# Patient Record
Sex: Male | Born: 1935
Health system: Southern US, Community
[De-identification: ages and names within clinical notes are randomized; demographics above are authoritative.]

## PROBLEM LIST (undated history)

## (undated) DIAGNOSIS — Z9889 Other specified postprocedural states: Secondary | ICD-10-CM

## (undated) DIAGNOSIS — I519 Heart disease, unspecified: Secondary | ICD-10-CM

## (undated) DIAGNOSIS — C61 Malignant neoplasm of prostate: Secondary | ICD-10-CM

## (undated) DIAGNOSIS — E785 Hyperlipidemia, unspecified: Secondary | ICD-10-CM

## (undated) DIAGNOSIS — K2270 Barrett's esophagus without dysplasia: Secondary | ICD-10-CM

## (undated) DIAGNOSIS — D649 Anemia, unspecified: Secondary | ICD-10-CM

## (undated) DIAGNOSIS — K115 Sialolithiasis: Secondary | ICD-10-CM

## (undated) DIAGNOSIS — I10 Essential (primary) hypertension: Secondary | ICD-10-CM

## (undated) DIAGNOSIS — G610 Guillain-Barre syndrome: Secondary | ICD-10-CM

## (undated) HISTORY — DX: Hyperlipidemia, unspecified: E78.5

## (undated) HISTORY — DX: Barrett's esophagus without dysplasia: K22.70

## (undated) HISTORY — DX: Guillain-Barre syndrome: G61.0

## (undated) HISTORY — DX: Malignant neoplasm of prostate: C61

## (undated) HISTORY — DX: Anemia, unspecified: D64.9

## (undated) HISTORY — DX: Heart disease, unspecified: I51.9

## (undated) HISTORY — PX: APPENDECTOMY: SHX54

## (undated) HISTORY — DX: Essential (primary) hypertension: I10

## (undated) HISTORY — PX: OTHER SURGICAL HISTORY: SHX169

## (undated) HISTORY — PX: REPLACEMENT TOTAL KNEE BILATERAL: SUR1225

---

## 1997-12-19 ENCOUNTER — Encounter: Admission: RE | Admit: 1997-12-19 | Discharge: 1998-03-19 | Payer: Self-pay | Admitting: Internal Medicine

## 1998-07-15 ENCOUNTER — Inpatient Hospital Stay (HOSPITAL_COMMUNITY): Admission: EM | Admit: 1998-07-15 | Discharge: 1998-07-26 | Payer: Self-pay | Admitting: Cardiology

## 1998-07-23 ENCOUNTER — Encounter: Payer: Self-pay | Admitting: Thoracic Surgery (Cardiothoracic Vascular Surgery)

## 1998-11-11 ENCOUNTER — Inpatient Hospital Stay (HOSPITAL_COMMUNITY): Admission: EM | Admit: 1998-11-11 | Discharge: 1998-11-13 | Payer: Self-pay | Admitting: Cardiology

## 2011-05-13 DIAGNOSIS — C61 Malignant neoplasm of prostate: Secondary | ICD-10-CM

## 2011-05-14 ENCOUNTER — Ambulatory Visit
Admission: RE | Admit: 2011-05-14 | Discharge: 2011-05-14 | Disposition: A | Discharge status: Home / Self Care | Payer: Medicare Other | Source: Ambulatory Visit | Attending: Radiation Oncology | Admitting: Radiation Oncology

## 2011-05-14 ENCOUNTER — Encounter: Payer: Self-pay | Admitting: Radiation Oncology

## 2011-05-14 ENCOUNTER — Encounter: Payer: Self-pay | Admitting: *Deleted

## 2011-05-14 VITALS — BP 129/74 | HR 54 | Temp 97.4°F | Resp 18 | Ht 70.0 in | Wt 229.0 lb

## 2011-05-14 DIAGNOSIS — E119 Type 2 diabetes mellitus without complications: Secondary | ICD-10-CM | POA: Insufficient documentation

## 2011-05-14 DIAGNOSIS — Z951 Presence of aortocoronary bypass graft: Secondary | ICD-10-CM | POA: Insufficient documentation

## 2011-05-14 DIAGNOSIS — C61 Malignant neoplasm of prostate: Secondary | ICD-10-CM

## 2011-05-14 DIAGNOSIS — Z87891 Personal history of nicotine dependence: Secondary | ICD-10-CM | POA: Insufficient documentation

## 2011-05-14 DIAGNOSIS — Z96659 Presence of unspecified artificial knee joint: Secondary | ICD-10-CM | POA: Insufficient documentation

## 2011-05-14 DIAGNOSIS — I10 Essential (primary) hypertension: Secondary | ICD-10-CM | POA: Insufficient documentation

## 2011-05-14 DIAGNOSIS — I519 Heart disease, unspecified: Secondary | ICD-10-CM | POA: Insufficient documentation

## 2011-05-14 DIAGNOSIS — E785 Hyperlipidemia, unspecified: Secondary | ICD-10-CM | POA: Insufficient documentation

## 2011-05-14 HISTORY — DX: Sialolithiasis: K11.5

## 2011-05-14 HISTORY — DX: Other specified postprocedural states: Z98.890

## 2011-05-14 NOTE — Progress Notes (Signed)
URINARY INCONTINENCE, INTERMITTENT PAIN IN GROIN AREA, FREQUENCY OF URINATION.  PT STATES HE HAS INGUINAL HERNIA THAT HAS STARTED BOTHERING HIM WITH MOVEMENT.  HAS HX OF BS DROPPING WITH CERTAIN POSITIONS, FEELS FAINT WHEN THIS HAPPENS.

## 2011-05-14 NOTE — Progress Notes (Signed)
[facility] Radiation Oncology NEW PATIENT EVALUATION  Name: Todd Sampson MRN: [id number]  Date: 05/14/2011  DOB: 08-06-1934  Status:outpatient    CC:No primary provider on file.  Debroah Baller, MD    REFERRING PHYSICIAN: Debroah Baller, MD   DIAGNOSIS: Adenocarcinoma of the prostate, clinical stage TI C., favorable risk   HISTORY OF PRESENT ILLNESS::Todd Sampson is a 76 y.o. male who is seen today for the courtesy of Dr. Albin Felling for evaluation of his stage TI C. favorable risk adenocarcinoma prostate. He was noted to have a PSA of 5.3. I do not have any previous PSA determinations. He was seen by Dr. Albin Felling and found to have an elevated PCA 3 of 49. He underwent ultrasound-guided biopsies on 04/29/2011. He was sent to have a Gleason 6 (3+3) adenocarcinoma from the left lateral base, left lateral mid gland and left lateral apex. He had disease involvement of 5%, 1%, and 28%, respectively. His prostate volume was 102.6 cc and prostatitic length 5.97 cm. He does have moderate urinary obstructive symptomatology he was given Flomax samples the past but he did not note any improvement. His I PSS score is 14. No rectal symptomatology although he does have a history of an inguinal hernia. He does have erectile dysfunction and is not sexually active. [name]   PREVIOUS RADIATION THERAPY: No   PAST MEDICAL HISTORY:  has a past medical history of Anemia; Hypertension; Heart disease; Diabetes mellitus; Hyperlipidemia; Barrett's esophagus; Guillain-Barre disease; Prostate cancer; H/O: hemorrhoidectomy; and Salivary gland calculi.     PAST SURGICAL HISTORY: Past Surgical History  Procedure Date  . Appendectomy   . Replacement total knee bilateral   . Cardiac bypass      FAMILY HISTORY: family history includes Cancer in his brother and mother and Heart disease in his brother, father, and sister.   SOCIAL HISTORY:  reports that he has quit smoking. He does not have any smokeless tobacco  history on file. He reports that he does not drink alcohol.   ALLERGIES: Penicillins   MEDICATIONS: Current outpatient prescriptions:amLODipine (NORVASC) 10 MG tablet, Take 10 mg by mouth daily.  , Disp: , Rfl: ;  aspirin EC 325 MG tablet, Take 325 mg by mouth daily.  , Disp: , Rfl: ;  benazepril (LOTENSIN) 40 MG tablet, Take 40 mg by mouth daily.  , Disp: , Rfl: ;  carvedilol (COREG) 3.125 MG tablet, Take 3.125 mg by mouth 2 (two) times daily with a meal.  , Disp: , Rfl:  ferrous sulfate 325 (65 FE) MG tablet, Take 325 mg by mouth daily with breakfast.  , Disp: , Rfl: ;  fish oil-omega-3 fatty acids 1000 MG capsule, Take 1,000 mg by mouth daily.  , Disp: , Rfl: ;  folic acid (FOLVITE) 1 MG tablet, Take 1 mg by mouth daily.  , Disp: , Rfl: ;  isosorbide mononitrate (IMDUR) 30 MG 24 hr tablet, Take 30 mg by mouth daily.  , Disp: , Rfl: ;  niacin 500 MG CR capsule, Take 500 mg by mouth at bedtime.  , Disp: , Rfl:  omeprazole (PRILOSEC) 20 MG capsule, Take 20 mg by mouth daily.  , Disp: , Rfl: ;  Tamsulosin HCl (FLOMAX) 0.4 MG CAPS, Take by mouth. , Disp: , Rfl: ;  thiamine (VITAMIN B-1) 100 MG tablet, Take 100 mg by mouth daily.  , Disp: , Rfl:    REVIEW OF SYSTEMS:  Pertinent items are noted in HPI.    PHYSICAL EXAM:  height is 5\' 10"  (1.778 m) and weight is 229 lb (103.874 kg). His oral temperature is 97.4 F (36.3 C). His blood pressure is 129/74 and his pulse is 54. His respiration is 18.  Head and neck examination grossly unremarkable. Nodes: Without palpable cervical or supraclavicular lymphadenopathy. Chest lungs clear. Heart: Regular rate and rhythm. Back without spinal or CVA discomfort. Abdomen without masses or organomegaly. Genitalia: Unremarkable to inspection. Rectal: The prostate gland is markedly enlarged and is without focal induration or nodularity. Extremities without edema. Neurologic examination: Nonfocal.    LABORATORY DATA: PSA 5.3      IMPRESSION: T1 C. favorable risk  adenocarcinoma prostate. I explained to the patient and his wife that his prognosis is related to his stage, PSA level, and Gleason score. All are favorable. We discussed surgery versus close surveillance versus radiation therapy. We spent a good deal of time discussing close surveillance which would include followup PSA determinations, and perhaps repeating his biopsy in one year. We discussed prostate seed implantation and also external beam/IMRT. His gland size is obviously enlarged, but it may be possible to downsize his gland to allow for prostate seed implantation. He may require up to 6 months of total androgen deprivation. After a lengthy discussion he is most interested in close surveillance which I think is certainly reasonable. He'll maintain close followup with Dr. Albin Felling.  PLAN: As above. He is maintain followup with Dr. Albin Felling. I gave him my voicemail should he want to consider radiation therapy at any point in time.  I spent 60 minutes minutes face to face with the patient and more than 50% of that time was spent in counseling and/or coordination of care.

## 2011-05-14 NOTE — Progress Notes (Signed)
Please see the Nurse Progress Note in the MD Initial Consult Encounter for this patient. 

## 2011-05-21 NOTE — Progress Notes (Signed)
Encounter addended by: Delynn Flavin, RN on: 05/21/2011  6:57 PM<BR>     Documentation filed: Visit Diagnoses

## 2011-05-22 NOTE — Progress Notes (Signed)
Encounter addended by: Amanda Pea, RN on: 05/22/2011 10:02 AM<BR>     Documentation filed: Charges VN

## 2011-07-08 DIAGNOSIS — D500 Iron deficiency anemia secondary to blood loss (chronic): Secondary | ICD-10-CM | POA: Diagnosis not present

## 2011-07-19 DIAGNOSIS — K319 Disease of stomach and duodenum, unspecified: Secondary | ICD-10-CM | POA: Diagnosis not present

## 2011-07-19 DIAGNOSIS — K2940 Chronic atrophic gastritis without bleeding: Secondary | ICD-10-CM | POA: Diagnosis not present

## 2011-07-19 DIAGNOSIS — R1314 Dysphagia, pharyngoesophageal phase: Secondary | ICD-10-CM | POA: Diagnosis not present

## 2011-07-19 DIAGNOSIS — K2270 Barrett's esophagus without dysplasia: Secondary | ICD-10-CM | POA: Diagnosis not present

## 2011-08-05 DIAGNOSIS — D500 Iron deficiency anemia secondary to blood loss (chronic): Secondary | ICD-10-CM | POA: Diagnosis not present

## 2011-08-12 DIAGNOSIS — D485 Neoplasm of uncertain behavior of skin: Secondary | ICD-10-CM | POA: Diagnosis not present

## 2011-08-12 DIAGNOSIS — L570 Actinic keratosis: Secondary | ICD-10-CM | POA: Diagnosis not present

## 2011-08-21 DIAGNOSIS — R351 Nocturia: Secondary | ICD-10-CM | POA: Diagnosis not present

## 2011-08-21 DIAGNOSIS — C61 Malignant neoplasm of prostate: Secondary | ICD-10-CM | POA: Diagnosis not present

## 2011-08-22 DIAGNOSIS — C44611 Basal cell carcinoma of skin of unspecified upper limb, including shoulder: Secondary | ICD-10-CM | POA: Diagnosis not present

## 2011-09-24 DIAGNOSIS — Z79899 Other long term (current) drug therapy: Secondary | ICD-10-CM | POA: Diagnosis not present

## 2011-09-24 DIAGNOSIS — E785 Hyperlipidemia, unspecified: Secondary | ICD-10-CM | POA: Diagnosis not present

## 2011-09-24 DIAGNOSIS — I10 Essential (primary) hypertension: Secondary | ICD-10-CM | POA: Diagnosis not present

## 2011-09-24 DIAGNOSIS — E119 Type 2 diabetes mellitus without complications: Secondary | ICD-10-CM | POA: Diagnosis not present

## 2011-09-24 DIAGNOSIS — I4891 Unspecified atrial fibrillation: Secondary | ICD-10-CM | POA: Diagnosis not present

## 2011-09-24 DIAGNOSIS — I2510 Atherosclerotic heart disease of native coronary artery without angina pectoris: Secondary | ICD-10-CM | POA: Diagnosis not present

## 2011-09-26 DIAGNOSIS — I10 Essential (primary) hypertension: Secondary | ICD-10-CM | POA: Diagnosis not present

## 2011-10-02 ENCOUNTER — Encounter: Payer: Self-pay | Admitting: *Deleted

## 2011-10-16 DIAGNOSIS — E785 Hyperlipidemia, unspecified: Secondary | ICD-10-CM | POA: Diagnosis not present

## 2011-10-16 DIAGNOSIS — Z7901 Long term (current) use of anticoagulants: Secondary | ICD-10-CM | POA: Diagnosis not present

## 2011-10-16 DIAGNOSIS — I2510 Atherosclerotic heart disease of native coronary artery without angina pectoris: Secondary | ICD-10-CM | POA: Diagnosis not present

## 2011-10-16 DIAGNOSIS — I10 Essential (primary) hypertension: Secondary | ICD-10-CM | POA: Diagnosis not present

## 2011-10-16 DIAGNOSIS — E119 Type 2 diabetes mellitus without complications: Secondary | ICD-10-CM | POA: Diagnosis not present

## 2011-10-30 DIAGNOSIS — Z7901 Long term (current) use of anticoagulants: Secondary | ICD-10-CM | POA: Diagnosis not present

## 2011-10-30 DIAGNOSIS — I4891 Unspecified atrial fibrillation: Secondary | ICD-10-CM | POA: Diagnosis not present

## 2011-10-30 DIAGNOSIS — E119 Type 2 diabetes mellitus without complications: Secondary | ICD-10-CM | POA: Diagnosis not present

## 2011-11-06 DIAGNOSIS — I4891 Unspecified atrial fibrillation: Secondary | ICD-10-CM | POA: Diagnosis not present

## 2011-11-06 DIAGNOSIS — Z7901 Long term (current) use of anticoagulants: Secondary | ICD-10-CM | POA: Diagnosis not present

## 2011-11-13 DIAGNOSIS — I4891 Unspecified atrial fibrillation: Secondary | ICD-10-CM | POA: Diagnosis not present

## 2011-11-13 DIAGNOSIS — Z7901 Long term (current) use of anticoagulants: Secondary | ICD-10-CM | POA: Diagnosis not present

## 2011-11-18 DIAGNOSIS — R351 Nocturia: Secondary | ICD-10-CM | POA: Diagnosis not present

## 2011-11-18 DIAGNOSIS — C61 Malignant neoplasm of prostate: Secondary | ICD-10-CM | POA: Diagnosis not present

## 2011-11-19 DIAGNOSIS — D500 Iron deficiency anemia secondary to blood loss (chronic): Secondary | ICD-10-CM | POA: Diagnosis not present

## 2011-11-27 DIAGNOSIS — Z7901 Long term (current) use of anticoagulants: Secondary | ICD-10-CM | POA: Diagnosis not present

## 2011-11-27 DIAGNOSIS — I4891 Unspecified atrial fibrillation: Secondary | ICD-10-CM | POA: Diagnosis not present

## 2011-12-23 DIAGNOSIS — I4891 Unspecified atrial fibrillation: Secondary | ICD-10-CM | POA: Diagnosis not present

## 2011-12-23 DIAGNOSIS — Z7901 Long term (current) use of anticoagulants: Secondary | ICD-10-CM | POA: Diagnosis not present

## 2011-12-27 DIAGNOSIS — D649 Anemia, unspecified: Secondary | ICD-10-CM | POA: Diagnosis not present

## 2011-12-27 DIAGNOSIS — E785 Hyperlipidemia, unspecified: Secondary | ICD-10-CM | POA: Diagnosis not present

## 2011-12-27 DIAGNOSIS — E119 Type 2 diabetes mellitus without complications: Secondary | ICD-10-CM | POA: Diagnosis not present

## 2011-12-27 DIAGNOSIS — I2510 Atherosclerotic heart disease of native coronary artery without angina pectoris: Secondary | ICD-10-CM | POA: Diagnosis not present

## 2011-12-27 DIAGNOSIS — I10 Essential (primary) hypertension: Secondary | ICD-10-CM | POA: Diagnosis not present

## 2012-01-20 DIAGNOSIS — I4891 Unspecified atrial fibrillation: Secondary | ICD-10-CM | POA: Diagnosis not present

## 2012-01-20 DIAGNOSIS — Z7901 Long term (current) use of anticoagulants: Secondary | ICD-10-CM | POA: Diagnosis not present

## 2012-01-21 DIAGNOSIS — T169XXA Foreign body in ear, unspecified ear, initial encounter: Secondary | ICD-10-CM | POA: Diagnosis not present

## 2012-02-03 DIAGNOSIS — I4891 Unspecified atrial fibrillation: Secondary | ICD-10-CM | POA: Diagnosis not present

## 2012-02-03 DIAGNOSIS — Z7901 Long term (current) use of anticoagulants: Secondary | ICD-10-CM | POA: Diagnosis not present

## 2012-02-03 DIAGNOSIS — K2270 Barrett's esophagus without dysplasia: Secondary | ICD-10-CM | POA: Diagnosis not present

## 2012-02-03 DIAGNOSIS — R1314 Dysphagia, pharyngoesophageal phase: Secondary | ICD-10-CM | POA: Diagnosis not present

## 2012-02-10 DIAGNOSIS — I4891 Unspecified atrial fibrillation: Secondary | ICD-10-CM | POA: Diagnosis not present

## 2012-02-10 DIAGNOSIS — Z7901 Long term (current) use of anticoagulants: Secondary | ICD-10-CM | POA: Diagnosis not present

## 2012-02-18 DIAGNOSIS — I4891 Unspecified atrial fibrillation: Secondary | ICD-10-CM | POA: Diagnosis not present

## 2012-02-18 DIAGNOSIS — Z7901 Long term (current) use of anticoagulants: Secondary | ICD-10-CM | POA: Diagnosis not present

## 2012-02-18 DIAGNOSIS — C44320 Squamous cell carcinoma of skin of unspecified parts of face: Secondary | ICD-10-CM | POA: Diagnosis not present

## 2012-02-19 DIAGNOSIS — C61 Malignant neoplasm of prostate: Secondary | ICD-10-CM | POA: Diagnosis not present

## 2012-02-19 DIAGNOSIS — R351 Nocturia: Secondary | ICD-10-CM | POA: Diagnosis not present

## 2012-02-25 DIAGNOSIS — Z7901 Long term (current) use of anticoagulants: Secondary | ICD-10-CM | POA: Diagnosis not present

## 2012-02-25 DIAGNOSIS — I4891 Unspecified atrial fibrillation: Secondary | ICD-10-CM | POA: Diagnosis not present

## 2012-03-17 DIAGNOSIS — Z7901 Long term (current) use of anticoagulants: Secondary | ICD-10-CM | POA: Diagnosis not present

## 2012-03-17 DIAGNOSIS — I4891 Unspecified atrial fibrillation: Secondary | ICD-10-CM | POA: Diagnosis not present

## 2012-03-31 DIAGNOSIS — I10 Essential (primary) hypertension: Secondary | ICD-10-CM | POA: Diagnosis not present

## 2012-04-15 DIAGNOSIS — I4891 Unspecified atrial fibrillation: Secondary | ICD-10-CM | POA: Diagnosis not present

## 2012-04-15 DIAGNOSIS — Z7901 Long term (current) use of anticoagulants: Secondary | ICD-10-CM | POA: Diagnosis not present

## 2012-05-08 DIAGNOSIS — Z23 Encounter for immunization: Secondary | ICD-10-CM | POA: Diagnosis not present

## 2012-05-08 DIAGNOSIS — I10 Essential (primary) hypertension: Secondary | ICD-10-CM | POA: Diagnosis not present

## 2012-05-13 DIAGNOSIS — E785 Hyperlipidemia, unspecified: Secondary | ICD-10-CM | POA: Diagnosis not present

## 2012-05-13 DIAGNOSIS — I10 Essential (primary) hypertension: Secondary | ICD-10-CM | POA: Diagnosis not present

## 2012-05-13 DIAGNOSIS — E669 Obesity, unspecified: Secondary | ICD-10-CM | POA: Diagnosis not present

## 2012-05-13 DIAGNOSIS — E7800 Pure hypercholesterolemia, unspecified: Secondary | ICD-10-CM | POA: Diagnosis not present

## 2012-05-13 DIAGNOSIS — I2510 Atherosclerotic heart disease of native coronary artery without angina pectoris: Secondary | ICD-10-CM | POA: Diagnosis not present

## 2012-05-13 DIAGNOSIS — E119 Type 2 diabetes mellitus without complications: Secondary | ICD-10-CM | POA: Diagnosis not present

## 2012-05-13 DIAGNOSIS — I4891 Unspecified atrial fibrillation: Secondary | ICD-10-CM | POA: Diagnosis not present

## 2012-05-25 DIAGNOSIS — D500 Iron deficiency anemia secondary to blood loss (chronic): Secondary | ICD-10-CM | POA: Diagnosis not present

## 2012-06-04 DIAGNOSIS — H538 Other visual disturbances: Secondary | ICD-10-CM | POA: Diagnosis not present

## 2012-06-10 DIAGNOSIS — Z7901 Long term (current) use of anticoagulants: Secondary | ICD-10-CM | POA: Diagnosis not present

## 2012-06-10 DIAGNOSIS — I4891 Unspecified atrial fibrillation: Secondary | ICD-10-CM | POA: Diagnosis not present

## 2012-06-19 DIAGNOSIS — C61 Malignant neoplasm of prostate: Secondary | ICD-10-CM | POA: Diagnosis not present

## 2012-06-22 DIAGNOSIS — M7989 Other specified soft tissue disorders: Secondary | ICD-10-CM | POA: Diagnosis not present

## 2012-06-22 DIAGNOSIS — R609 Edema, unspecified: Secondary | ICD-10-CM | POA: Diagnosis not present

## 2012-07-09 DIAGNOSIS — I4891 Unspecified atrial fibrillation: Secondary | ICD-10-CM | POA: Diagnosis not present

## 2012-07-09 DIAGNOSIS — Z7901 Long term (current) use of anticoagulants: Secondary | ICD-10-CM | POA: Diagnosis not present

## 2012-07-21 DIAGNOSIS — K2270 Barrett's esophagus without dysplasia: Secondary | ICD-10-CM | POA: Diagnosis not present

## 2012-07-21 DIAGNOSIS — D500 Iron deficiency anemia secondary to blood loss (chronic): Secondary | ICD-10-CM | POA: Diagnosis not present

## 2012-08-05 DIAGNOSIS — Z5181 Encounter for therapeutic drug level monitoring: Secondary | ICD-10-CM | POA: Diagnosis not present

## 2012-08-05 DIAGNOSIS — Z7901 Long term (current) use of anticoagulants: Secondary | ICD-10-CM | POA: Diagnosis not present

## 2012-08-06 DIAGNOSIS — K2270 Barrett's esophagus without dysplasia: Secondary | ICD-10-CM | POA: Diagnosis not present

## 2012-08-12 DIAGNOSIS — I4891 Unspecified atrial fibrillation: Secondary | ICD-10-CM | POA: Diagnosis not present

## 2012-08-12 DIAGNOSIS — Z7901 Long term (current) use of anticoagulants: Secondary | ICD-10-CM | POA: Diagnosis not present

## 2012-08-17 DIAGNOSIS — I4891 Unspecified atrial fibrillation: Secondary | ICD-10-CM | POA: Diagnosis not present

## 2012-08-17 DIAGNOSIS — I10 Essential (primary) hypertension: Secondary | ICD-10-CM | POA: Diagnosis not present

## 2012-08-17 DIAGNOSIS — Z7901 Long term (current) use of anticoagulants: Secondary | ICD-10-CM | POA: Diagnosis not present

## 2012-08-17 DIAGNOSIS — D649 Anemia, unspecified: Secondary | ICD-10-CM | POA: Diagnosis not present

## 2012-08-17 DIAGNOSIS — J018 Other acute sinusitis: Secondary | ICD-10-CM | POA: Diagnosis not present

## 2012-08-24 DIAGNOSIS — I4891 Unspecified atrial fibrillation: Secondary | ICD-10-CM | POA: Diagnosis not present

## 2012-08-24 DIAGNOSIS — Z7901 Long term (current) use of anticoagulants: Secondary | ICD-10-CM | POA: Diagnosis not present

## 2012-08-31 DIAGNOSIS — Z7901 Long term (current) use of anticoagulants: Secondary | ICD-10-CM | POA: Diagnosis not present

## 2012-09-14 DIAGNOSIS — M2550 Pain in unspecified joint: Secondary | ICD-10-CM | POA: Diagnosis not present

## 2012-09-14 DIAGNOSIS — I4891 Unspecified atrial fibrillation: Secondary | ICD-10-CM | POA: Diagnosis not present

## 2012-09-14 DIAGNOSIS — Z7901 Long term (current) use of anticoagulants: Secondary | ICD-10-CM | POA: Diagnosis not present

## 2012-09-15 DIAGNOSIS — R195 Other fecal abnormalities: Secondary | ICD-10-CM | POA: Diagnosis not present

## 2012-10-12 DIAGNOSIS — Z7901 Long term (current) use of anticoagulants: Secondary | ICD-10-CM | POA: Diagnosis not present

## 2012-10-12 DIAGNOSIS — I4891 Unspecified atrial fibrillation: Secondary | ICD-10-CM | POA: Diagnosis not present

## 2012-10-19 DIAGNOSIS — C61 Malignant neoplasm of prostate: Secondary | ICD-10-CM | POA: Diagnosis not present

## 2012-10-19 DIAGNOSIS — Z7901 Long term (current) use of anticoagulants: Secondary | ICD-10-CM | POA: Diagnosis not present

## 2012-10-19 DIAGNOSIS — I4891 Unspecified atrial fibrillation: Secondary | ICD-10-CM | POA: Diagnosis not present

## 2012-10-19 DIAGNOSIS — R351 Nocturia: Secondary | ICD-10-CM | POA: Diagnosis not present

## 2012-10-26 DIAGNOSIS — L600 Ingrowing nail: Secondary | ICD-10-CM | POA: Diagnosis not present

## 2012-10-26 DIAGNOSIS — R894 Abnormal immunological findings in specimens from other organs, systems and tissues: Secondary | ICD-10-CM | POA: Diagnosis not present

## 2012-10-26 DIAGNOSIS — E119 Type 2 diabetes mellitus without complications: Secondary | ICD-10-CM | POA: Diagnosis not present

## 2012-10-27 DIAGNOSIS — I4891 Unspecified atrial fibrillation: Secondary | ICD-10-CM | POA: Diagnosis not present

## 2012-10-27 DIAGNOSIS — Z7901 Long term (current) use of anticoagulants: Secondary | ICD-10-CM | POA: Diagnosis not present

## 2012-11-09 DIAGNOSIS — E119 Type 2 diabetes mellitus without complications: Secondary | ICD-10-CM | POA: Diagnosis not present

## 2012-11-09 DIAGNOSIS — L600 Ingrowing nail: Secondary | ICD-10-CM | POA: Diagnosis not present

## 2012-11-10 DIAGNOSIS — I10 Essential (primary) hypertension: Secondary | ICD-10-CM | POA: Diagnosis not present

## 2012-11-10 DIAGNOSIS — I2510 Atherosclerotic heart disease of native coronary artery without angina pectoris: Secondary | ICD-10-CM | POA: Diagnosis not present

## 2012-11-10 DIAGNOSIS — I4891 Unspecified atrial fibrillation: Secondary | ICD-10-CM | POA: Diagnosis not present

## 2012-11-10 DIAGNOSIS — E669 Obesity, unspecified: Secondary | ICD-10-CM | POA: Diagnosis not present

## 2012-11-10 DIAGNOSIS — E119 Type 2 diabetes mellitus without complications: Secondary | ICD-10-CM | POA: Diagnosis not present

## 2012-11-10 DIAGNOSIS — E7800 Pure hypercholesterolemia, unspecified: Secondary | ICD-10-CM | POA: Diagnosis not present

## 2012-11-16 DIAGNOSIS — I10 Essential (primary) hypertension: Secondary | ICD-10-CM | POA: Diagnosis not present

## 2012-11-16 DIAGNOSIS — D649 Anemia, unspecified: Secondary | ICD-10-CM | POA: Diagnosis not present

## 2012-11-16 DIAGNOSIS — M1990 Unspecified osteoarthritis, unspecified site: Secondary | ICD-10-CM | POA: Diagnosis not present

## 2012-11-26 DIAGNOSIS — I4891 Unspecified atrial fibrillation: Secondary | ICD-10-CM | POA: Diagnosis not present

## 2012-11-26 DIAGNOSIS — E7800 Pure hypercholesterolemia, unspecified: Secondary | ICD-10-CM | POA: Diagnosis not present

## 2012-11-26 DIAGNOSIS — I2510 Atherosclerotic heart disease of native coronary artery without angina pectoris: Secondary | ICD-10-CM | POA: Diagnosis not present

## 2012-11-26 DIAGNOSIS — Z01810 Encounter for preprocedural cardiovascular examination: Secondary | ICD-10-CM | POA: Diagnosis not present

## 2013-01-04 DIAGNOSIS — E119 Type 2 diabetes mellitus without complications: Secondary | ICD-10-CM | POA: Diagnosis not present

## 2013-01-04 DIAGNOSIS — L600 Ingrowing nail: Secondary | ICD-10-CM | POA: Diagnosis not present

## 2013-01-05 DIAGNOSIS — I4891 Unspecified atrial fibrillation: Secondary | ICD-10-CM | POA: Diagnosis not present

## 2013-01-05 DIAGNOSIS — Z7901 Long term (current) use of anticoagulants: Secondary | ICD-10-CM | POA: Diagnosis not present

## 2013-01-06 DIAGNOSIS — E119 Type 2 diabetes mellitus without complications: Secondary | ICD-10-CM | POA: Diagnosis not present

## 2013-01-06 DIAGNOSIS — L600 Ingrowing nail: Secondary | ICD-10-CM | POA: Diagnosis not present

## 2013-01-07 DIAGNOSIS — N4289 Other specified disorders of prostate: Secondary | ICD-10-CM | POA: Diagnosis not present

## 2013-01-07 DIAGNOSIS — R972 Elevated prostate specific antigen [PSA]: Secondary | ICD-10-CM | POA: Diagnosis not present

## 2013-01-07 DIAGNOSIS — C61 Malignant neoplasm of prostate: Secondary | ICD-10-CM | POA: Diagnosis not present

## 2013-01-14 DIAGNOSIS — C61 Malignant neoplasm of prostate: Secondary | ICD-10-CM | POA: Diagnosis not present

## 2013-01-15 DIAGNOSIS — Z7901 Long term (current) use of anticoagulants: Secondary | ICD-10-CM | POA: Diagnosis not present

## 2013-01-15 DIAGNOSIS — I4891 Unspecified atrial fibrillation: Secondary | ICD-10-CM | POA: Diagnosis not present

## 2013-01-20 DIAGNOSIS — E119 Type 2 diabetes mellitus without complications: Secondary | ICD-10-CM | POA: Diagnosis not present

## 2013-01-20 DIAGNOSIS — L600 Ingrowing nail: Secondary | ICD-10-CM | POA: Diagnosis not present

## 2013-01-22 DIAGNOSIS — Z7901 Long term (current) use of anticoagulants: Secondary | ICD-10-CM | POA: Diagnosis not present

## 2013-01-22 DIAGNOSIS — I4891 Unspecified atrial fibrillation: Secondary | ICD-10-CM | POA: Diagnosis not present

## 2013-02-01 DIAGNOSIS — I2510 Atherosclerotic heart disease of native coronary artery without angina pectoris: Secondary | ICD-10-CM | POA: Diagnosis not present

## 2013-02-01 DIAGNOSIS — I10 Essential (primary) hypertension: Secondary | ICD-10-CM | POA: Diagnosis not present

## 2013-02-01 DIAGNOSIS — E785 Hyperlipidemia, unspecified: Secondary | ICD-10-CM | POA: Diagnosis not present

## 2013-02-01 DIAGNOSIS — E119 Type 2 diabetes mellitus without complications: Secondary | ICD-10-CM | POA: Diagnosis not present

## 2013-02-01 DIAGNOSIS — I4891 Unspecified atrial fibrillation: Secondary | ICD-10-CM | POA: Diagnosis not present

## 2013-02-05 DIAGNOSIS — Z7901 Long term (current) use of anticoagulants: Secondary | ICD-10-CM | POA: Diagnosis not present

## 2013-02-05 DIAGNOSIS — I4891 Unspecified atrial fibrillation: Secondary | ICD-10-CM | POA: Diagnosis not present

## 2013-02-19 DIAGNOSIS — I4891 Unspecified atrial fibrillation: Secondary | ICD-10-CM | POA: Diagnosis not present

## 2013-02-19 DIAGNOSIS — Z7901 Long term (current) use of anticoagulants: Secondary | ICD-10-CM | POA: Diagnosis not present

## 2013-03-19 DIAGNOSIS — I4891 Unspecified atrial fibrillation: Secondary | ICD-10-CM | POA: Diagnosis not present

## 2013-03-19 DIAGNOSIS — Z7901 Long term (current) use of anticoagulants: Secondary | ICD-10-CM | POA: Diagnosis not present

## 2013-04-16 DIAGNOSIS — I4891 Unspecified atrial fibrillation: Secondary | ICD-10-CM | POA: Diagnosis not present

## 2013-04-16 DIAGNOSIS — Z7901 Long term (current) use of anticoagulants: Secondary | ICD-10-CM | POA: Diagnosis not present

## 2013-04-20 DIAGNOSIS — M545 Low back pain: Secondary | ICD-10-CM | POA: Diagnosis not present

## 2013-04-20 DIAGNOSIS — M25559 Pain in unspecified hip: Secondary | ICD-10-CM | POA: Diagnosis not present

## 2013-04-21 DIAGNOSIS — M545 Low back pain, unspecified: Secondary | ICD-10-CM | POA: Diagnosis not present

## 2013-04-21 DIAGNOSIS — M25559 Pain in unspecified hip: Secondary | ICD-10-CM | POA: Diagnosis not present

## 2013-04-21 DIAGNOSIS — M47817 Spondylosis without myelopathy or radiculopathy, lumbosacral region: Secondary | ICD-10-CM | POA: Diagnosis not present

## 2013-04-22 DIAGNOSIS — Z7901 Long term (current) use of anticoagulants: Secondary | ICD-10-CM | POA: Diagnosis not present

## 2013-04-22 DIAGNOSIS — I4891 Unspecified atrial fibrillation: Secondary | ICD-10-CM | POA: Diagnosis not present

## 2013-04-26 DIAGNOSIS — M48061 Spinal stenosis, lumbar region without neurogenic claudication: Secondary | ICD-10-CM | POA: Diagnosis not present

## 2013-04-26 DIAGNOSIS — M545 Low back pain: Secondary | ICD-10-CM | POA: Diagnosis not present

## 2013-05-05 DIAGNOSIS — Z23 Encounter for immunization: Secondary | ICD-10-CM | POA: Diagnosis not present

## 2013-05-05 DIAGNOSIS — I10 Essential (primary) hypertension: Secondary | ICD-10-CM | POA: Diagnosis not present

## 2013-05-05 DIAGNOSIS — D649 Anemia, unspecified: Secondary | ICD-10-CM | POA: Diagnosis not present

## 2013-05-06 DIAGNOSIS — I4891 Unspecified atrial fibrillation: Secondary | ICD-10-CM | POA: Diagnosis not present

## 2013-05-06 DIAGNOSIS — Z7901 Long term (current) use of anticoagulants: Secondary | ICD-10-CM | POA: Diagnosis not present

## 2013-05-17 DIAGNOSIS — C61 Malignant neoplasm of prostate: Secondary | ICD-10-CM | POA: Diagnosis not present

## 2013-05-17 DIAGNOSIS — R351 Nocturia: Secondary | ICD-10-CM | POA: Diagnosis not present

## 2013-05-26 DIAGNOSIS — E7800 Pure hypercholesterolemia, unspecified: Secondary | ICD-10-CM | POA: Diagnosis not present

## 2013-05-26 DIAGNOSIS — I4891 Unspecified atrial fibrillation: Secondary | ICD-10-CM | POA: Diagnosis not present

## 2013-05-26 DIAGNOSIS — I10 Essential (primary) hypertension: Secondary | ICD-10-CM | POA: Diagnosis not present

## 2013-05-26 DIAGNOSIS — E669 Obesity, unspecified: Secondary | ICD-10-CM | POA: Diagnosis not present

## 2013-05-26 DIAGNOSIS — E119 Type 2 diabetes mellitus without complications: Secondary | ICD-10-CM | POA: Diagnosis not present

## 2013-06-03 DIAGNOSIS — I4891 Unspecified atrial fibrillation: Secondary | ICD-10-CM | POA: Diagnosis not present

## 2013-06-03 DIAGNOSIS — Z7901 Long term (current) use of anticoagulants: Secondary | ICD-10-CM | POA: Diagnosis not present

## 2013-06-07 DIAGNOSIS — E119 Type 2 diabetes mellitus without complications: Secondary | ICD-10-CM | POA: Diagnosis not present

## 2013-06-07 DIAGNOSIS — H524 Presbyopia: Secondary | ICD-10-CM | POA: Diagnosis not present

## 2013-06-17 DIAGNOSIS — I4891 Unspecified atrial fibrillation: Secondary | ICD-10-CM | POA: Diagnosis not present

## 2013-07-02 DIAGNOSIS — I4891 Unspecified atrial fibrillation: Secondary | ICD-10-CM | POA: Diagnosis not present

## 2013-07-02 DIAGNOSIS — Z7901 Long term (current) use of anticoagulants: Secondary | ICD-10-CM | POA: Diagnosis not present

## 2013-07-07 DIAGNOSIS — L98499 Non-pressure chronic ulcer of skin of other sites with unspecified severity: Secondary | ICD-10-CM | POA: Diagnosis not present

## 2013-07-07 DIAGNOSIS — I872 Venous insufficiency (chronic) (peripheral): Secondary | ICD-10-CM | POA: Diagnosis not present

## 2013-07-07 DIAGNOSIS — E119 Type 2 diabetes mellitus without complications: Secondary | ICD-10-CM | POA: Diagnosis not present

## 2013-07-12 DIAGNOSIS — L98499 Non-pressure chronic ulcer of skin of other sites with unspecified severity: Secondary | ICD-10-CM | POA: Diagnosis not present

## 2013-07-12 DIAGNOSIS — I872 Venous insufficiency (chronic) (peripheral): Secondary | ICD-10-CM | POA: Diagnosis not present

## 2013-07-15 DIAGNOSIS — L98499 Non-pressure chronic ulcer of skin of other sites with unspecified severity: Secondary | ICD-10-CM | POA: Diagnosis not present

## 2013-07-15 DIAGNOSIS — E119 Type 2 diabetes mellitus without complications: Secondary | ICD-10-CM | POA: Diagnosis not present

## 2013-07-15 DIAGNOSIS — I872 Venous insufficiency (chronic) (peripheral): Secondary | ICD-10-CM | POA: Diagnosis not present

## 2013-07-19 DIAGNOSIS — L98499 Non-pressure chronic ulcer of skin of other sites with unspecified severity: Secondary | ICD-10-CM | POA: Diagnosis not present

## 2013-07-19 DIAGNOSIS — E119 Type 2 diabetes mellitus without complications: Secondary | ICD-10-CM | POA: Diagnosis not present

## 2013-07-19 DIAGNOSIS — I872 Venous insufficiency (chronic) (peripheral): Secondary | ICD-10-CM | POA: Diagnosis not present

## 2013-07-21 DIAGNOSIS — E119 Type 2 diabetes mellitus without complications: Secondary | ICD-10-CM | POA: Diagnosis not present

## 2013-07-21 DIAGNOSIS — I872 Venous insufficiency (chronic) (peripheral): Secondary | ICD-10-CM | POA: Diagnosis not present

## 2013-07-21 DIAGNOSIS — L98499 Non-pressure chronic ulcer of skin of other sites with unspecified severity: Secondary | ICD-10-CM | POA: Diagnosis not present

## 2013-07-26 DIAGNOSIS — I872 Venous insufficiency (chronic) (peripheral): Secondary | ICD-10-CM | POA: Diagnosis not present

## 2013-07-26 DIAGNOSIS — E119 Type 2 diabetes mellitus without complications: Secondary | ICD-10-CM | POA: Diagnosis not present

## 2013-07-26 DIAGNOSIS — L98499 Non-pressure chronic ulcer of skin of other sites with unspecified severity: Secondary | ICD-10-CM | POA: Diagnosis not present

## 2013-07-28 DIAGNOSIS — I872 Venous insufficiency (chronic) (peripheral): Secondary | ICD-10-CM | POA: Diagnosis not present

## 2013-07-28 DIAGNOSIS — I83009 Varicose veins of unspecified lower extremity with ulcer of unspecified site: Secondary | ICD-10-CM | POA: Diagnosis not present

## 2013-07-28 DIAGNOSIS — L97909 Non-pressure chronic ulcer of unspecified part of unspecified lower leg with unspecified severity: Secondary | ICD-10-CM | POA: Diagnosis not present

## 2013-07-28 DIAGNOSIS — L98499 Non-pressure chronic ulcer of skin of other sites with unspecified severity: Secondary | ICD-10-CM | POA: Diagnosis not present

## 2013-07-30 DIAGNOSIS — I4891 Unspecified atrial fibrillation: Secondary | ICD-10-CM | POA: Diagnosis not present

## 2013-07-30 DIAGNOSIS — Z7901 Long term (current) use of anticoagulants: Secondary | ICD-10-CM | POA: Diagnosis not present

## 2013-08-02 DIAGNOSIS — L98499 Non-pressure chronic ulcer of skin of other sites with unspecified severity: Secondary | ICD-10-CM | POA: Diagnosis not present

## 2013-08-02 DIAGNOSIS — I83009 Varicose veins of unspecified lower extremity with ulcer of unspecified site: Secondary | ICD-10-CM | POA: Diagnosis not present

## 2013-08-02 DIAGNOSIS — I872 Venous insufficiency (chronic) (peripheral): Secondary | ICD-10-CM | POA: Diagnosis not present

## 2013-08-09 DIAGNOSIS — I4891 Unspecified atrial fibrillation: Secondary | ICD-10-CM | POA: Diagnosis not present

## 2013-08-09 DIAGNOSIS — L97909 Non-pressure chronic ulcer of unspecified part of unspecified lower leg with unspecified severity: Secondary | ICD-10-CM | POA: Diagnosis not present

## 2013-08-09 DIAGNOSIS — I872 Venous insufficiency (chronic) (peripheral): Secondary | ICD-10-CM | POA: Diagnosis not present

## 2013-08-09 DIAGNOSIS — L98499 Non-pressure chronic ulcer of skin of other sites with unspecified severity: Secondary | ICD-10-CM | POA: Diagnosis not present

## 2013-08-09 DIAGNOSIS — I83009 Varicose veins of unspecified lower extremity with ulcer of unspecified site: Secondary | ICD-10-CM | POA: Diagnosis not present

## 2013-08-16 DIAGNOSIS — I872 Venous insufficiency (chronic) (peripheral): Secondary | ICD-10-CM | POA: Diagnosis not present

## 2013-08-16 DIAGNOSIS — L98499 Non-pressure chronic ulcer of skin of other sites with unspecified severity: Secondary | ICD-10-CM | POA: Diagnosis not present

## 2013-08-27 DIAGNOSIS — I4891 Unspecified atrial fibrillation: Secondary | ICD-10-CM | POA: Diagnosis not present

## 2013-08-27 DIAGNOSIS — Z7901 Long term (current) use of anticoagulants: Secondary | ICD-10-CM | POA: Diagnosis not present

## 2013-09-15 DIAGNOSIS — C61 Malignant neoplasm of prostate: Secondary | ICD-10-CM | POA: Diagnosis not present

## 2013-09-15 DIAGNOSIS — R351 Nocturia: Secondary | ICD-10-CM | POA: Diagnosis not present

## 2013-09-20 DIAGNOSIS — I83009 Varicose veins of unspecified lower extremity with ulcer of unspecified site: Secondary | ICD-10-CM | POA: Diagnosis not present

## 2013-09-20 DIAGNOSIS — I872 Venous insufficiency (chronic) (peripheral): Secondary | ICD-10-CM | POA: Diagnosis not present

## 2013-09-20 DIAGNOSIS — L97909 Non-pressure chronic ulcer of unspecified part of unspecified lower leg with unspecified severity: Secondary | ICD-10-CM | POA: Diagnosis not present

## 2013-09-23 DIAGNOSIS — L259 Unspecified contact dermatitis, unspecified cause: Secondary | ICD-10-CM | POA: Diagnosis not present

## 2013-09-23 DIAGNOSIS — L981 Factitial dermatitis: Secondary | ICD-10-CM | POA: Diagnosis not present

## 2013-09-24 DIAGNOSIS — Z7901 Long term (current) use of anticoagulants: Secondary | ICD-10-CM | POA: Diagnosis not present

## 2013-09-24 DIAGNOSIS — I4891 Unspecified atrial fibrillation: Secondary | ICD-10-CM | POA: Diagnosis not present

## 2013-09-27 DIAGNOSIS — I872 Venous insufficiency (chronic) (peripheral): Secondary | ICD-10-CM | POA: Diagnosis not present

## 2013-09-27 DIAGNOSIS — L97909 Non-pressure chronic ulcer of unspecified part of unspecified lower leg with unspecified severity: Secondary | ICD-10-CM | POA: Diagnosis not present

## 2013-09-27 DIAGNOSIS — I83009 Varicose veins of unspecified lower extremity with ulcer of unspecified site: Secondary | ICD-10-CM | POA: Diagnosis not present

## 2013-09-27 DIAGNOSIS — L98499 Non-pressure chronic ulcer of skin of other sites with unspecified severity: Secondary | ICD-10-CM | POA: Diagnosis not present

## 2013-10-13 DIAGNOSIS — I872 Venous insufficiency (chronic) (peripheral): Secondary | ICD-10-CM | POA: Diagnosis not present

## 2013-10-13 DIAGNOSIS — I83009 Varicose veins of unspecified lower extremity with ulcer of unspecified site: Secondary | ICD-10-CM | POA: Diagnosis not present

## 2013-10-20 DIAGNOSIS — I872 Venous insufficiency (chronic) (peripheral): Secondary | ICD-10-CM | POA: Diagnosis not present

## 2013-10-20 DIAGNOSIS — L97909 Non-pressure chronic ulcer of unspecified part of unspecified lower leg with unspecified severity: Secondary | ICD-10-CM | POA: Diagnosis not present

## 2013-10-20 DIAGNOSIS — I83009 Varicose veins of unspecified lower extremity with ulcer of unspecified site: Secondary | ICD-10-CM | POA: Diagnosis not present

## 2013-10-26 DIAGNOSIS — I4891 Unspecified atrial fibrillation: Secondary | ICD-10-CM | POA: Diagnosis not present

## 2013-10-26 DIAGNOSIS — Z7901 Long term (current) use of anticoagulants: Secondary | ICD-10-CM | POA: Diagnosis not present

## 2013-10-27 DIAGNOSIS — I872 Venous insufficiency (chronic) (peripheral): Secondary | ICD-10-CM | POA: Diagnosis not present

## 2013-10-27 DIAGNOSIS — I83009 Varicose veins of unspecified lower extremity with ulcer of unspecified site: Secondary | ICD-10-CM | POA: Diagnosis not present

## 2013-11-11 DIAGNOSIS — E1149 Type 2 diabetes mellitus with other diabetic neurological complication: Secondary | ICD-10-CM | POA: Diagnosis not present

## 2013-11-11 DIAGNOSIS — E119 Type 2 diabetes mellitus without complications: Secondary | ICD-10-CM | POA: Diagnosis not present

## 2013-11-23 DIAGNOSIS — I4891 Unspecified atrial fibrillation: Secondary | ICD-10-CM | POA: Diagnosis not present

## 2013-11-23 DIAGNOSIS — Z7901 Long term (current) use of anticoagulants: Secondary | ICD-10-CM | POA: Diagnosis not present

## 2013-12-07 DIAGNOSIS — Z951 Presence of aortocoronary bypass graft: Secondary | ICD-10-CM | POA: Diagnosis not present

## 2013-12-07 DIAGNOSIS — I2510 Atherosclerotic heart disease of native coronary artery without angina pectoris: Secondary | ICD-10-CM | POA: Diagnosis not present

## 2013-12-07 DIAGNOSIS — I10 Essential (primary) hypertension: Secondary | ICD-10-CM | POA: Diagnosis not present

## 2013-12-07 DIAGNOSIS — E669 Obesity, unspecified: Secondary | ICD-10-CM | POA: Diagnosis not present

## 2013-12-07 DIAGNOSIS — E119 Type 2 diabetes mellitus without complications: Secondary | ICD-10-CM | POA: Diagnosis not present

## 2013-12-07 DIAGNOSIS — E7800 Pure hypercholesterolemia, unspecified: Secondary | ICD-10-CM | POA: Diagnosis not present

## 2013-12-21 DIAGNOSIS — I4891 Unspecified atrial fibrillation: Secondary | ICD-10-CM | POA: Diagnosis not present

## 2013-12-21 DIAGNOSIS — Z7901 Long term (current) use of anticoagulants: Secondary | ICD-10-CM | POA: Diagnosis not present

## 2014-01-04 DIAGNOSIS — I4891 Unspecified atrial fibrillation: Secondary | ICD-10-CM | POA: Diagnosis not present

## 2014-01-04 DIAGNOSIS — Z7901 Long term (current) use of anticoagulants: Secondary | ICD-10-CM | POA: Diagnosis not present

## 2014-01-17 DIAGNOSIS — C61 Malignant neoplasm of prostate: Secondary | ICD-10-CM | POA: Diagnosis not present

## 2014-01-17 DIAGNOSIS — R351 Nocturia: Secondary | ICD-10-CM | POA: Diagnosis not present

## 2014-01-25 DIAGNOSIS — I4891 Unspecified atrial fibrillation: Secondary | ICD-10-CM | POA: Diagnosis not present

## 2014-01-25 DIAGNOSIS — Z7901 Long term (current) use of anticoagulants: Secondary | ICD-10-CM | POA: Diagnosis not present

## 2014-02-17 DIAGNOSIS — E785 Hyperlipidemia, unspecified: Secondary | ICD-10-CM | POA: Diagnosis not present

## 2014-02-17 DIAGNOSIS — I10 Essential (primary) hypertension: Secondary | ICD-10-CM | POA: Diagnosis not present

## 2014-02-17 DIAGNOSIS — E119 Type 2 diabetes mellitus without complications: Secondary | ICD-10-CM | POA: Diagnosis not present

## 2014-02-17 DIAGNOSIS — I2510 Atherosclerotic heart disease of native coronary artery without angina pectoris: Secondary | ICD-10-CM | POA: Diagnosis not present

## 2014-02-22 DIAGNOSIS — Z7901 Long term (current) use of anticoagulants: Secondary | ICD-10-CM | POA: Diagnosis not present

## 2014-02-22 DIAGNOSIS — I4891 Unspecified atrial fibrillation: Secondary | ICD-10-CM | POA: Diagnosis not present

## 2014-03-08 DIAGNOSIS — E119 Type 2 diabetes mellitus without complications: Secondary | ICD-10-CM | POA: Diagnosis not present

## 2014-03-22 DIAGNOSIS — I4891 Unspecified atrial fibrillation: Secondary | ICD-10-CM | POA: Diagnosis not present

## 2014-03-22 DIAGNOSIS — Z7901 Long term (current) use of anticoagulants: Secondary | ICD-10-CM | POA: Diagnosis not present

## 2014-04-21 DIAGNOSIS — I4891 Unspecified atrial fibrillation: Secondary | ICD-10-CM | POA: Diagnosis not present

## 2014-04-21 DIAGNOSIS — Z7901 Long term (current) use of anticoagulants: Secondary | ICD-10-CM | POA: Diagnosis not present

## 2014-05-03 DIAGNOSIS — C44311 Basal cell carcinoma of skin of nose: Secondary | ICD-10-CM | POA: Diagnosis not present

## 2014-05-03 DIAGNOSIS — D485 Neoplasm of uncertain behavior of skin: Secondary | ICD-10-CM | POA: Diagnosis not present

## 2014-05-03 DIAGNOSIS — L570 Actinic keratosis: Secondary | ICD-10-CM | POA: Diagnosis not present

## 2014-05-04 DIAGNOSIS — I83893 Varicose veins of bilateral lower extremities with other complications: Secondary | ICD-10-CM | POA: Diagnosis not present

## 2014-05-04 DIAGNOSIS — I872 Venous insufficiency (chronic) (peripheral): Secondary | ICD-10-CM | POA: Diagnosis not present

## 2014-05-04 DIAGNOSIS — I82819 Embolism and thrombosis of superficial veins of unspecified lower extremities: Secondary | ICD-10-CM | POA: Diagnosis not present

## 2014-05-12 DIAGNOSIS — I10 Essential (primary) hypertension: Secondary | ICD-10-CM | POA: Diagnosis not present

## 2014-05-12 DIAGNOSIS — E785 Hyperlipidemia, unspecified: Secondary | ICD-10-CM | POA: Diagnosis not present

## 2014-05-12 DIAGNOSIS — I4891 Unspecified atrial fibrillation: Secondary | ICD-10-CM | POA: Diagnosis not present

## 2014-05-12 DIAGNOSIS — I2510 Atherosclerotic heart disease of native coronary artery without angina pectoris: Secondary | ICD-10-CM | POA: Diagnosis not present

## 2014-05-12 DIAGNOSIS — Z79899 Other long term (current) drug therapy: Secondary | ICD-10-CM | POA: Diagnosis not present

## 2014-05-12 DIAGNOSIS — E669 Obesity, unspecified: Secondary | ICD-10-CM | POA: Diagnosis not present

## 2014-05-12 DIAGNOSIS — E119 Type 2 diabetes mellitus without complications: Secondary | ICD-10-CM | POA: Diagnosis not present

## 2014-05-12 DIAGNOSIS — Z951 Presence of aortocoronary bypass graft: Secondary | ICD-10-CM | POA: Diagnosis not present

## 2014-05-19 DIAGNOSIS — I4891 Unspecified atrial fibrillation: Secondary | ICD-10-CM | POA: Diagnosis not present

## 2014-05-19 DIAGNOSIS — Z7901 Long term (current) use of anticoagulants: Secondary | ICD-10-CM | POA: Diagnosis not present

## 2014-05-19 DIAGNOSIS — I2510 Atherosclerotic heart disease of native coronary artery without angina pectoris: Secondary | ICD-10-CM | POA: Diagnosis not present

## 2014-05-25 DIAGNOSIS — C44311 Basal cell carcinoma of skin of nose: Secondary | ICD-10-CM | POA: Diagnosis not present

## 2014-05-30 DIAGNOSIS — E119 Type 2 diabetes mellitus without complications: Secondary | ICD-10-CM | POA: Diagnosis not present

## 2014-05-30 DIAGNOSIS — I10 Essential (primary) hypertension: Secondary | ICD-10-CM | POA: Diagnosis not present

## 2014-05-30 DIAGNOSIS — R062 Wheezing: Secondary | ICD-10-CM | POA: Diagnosis not present

## 2014-05-31 DIAGNOSIS — R0989 Other specified symptoms and signs involving the circulatory and respiratory systems: Secondary | ICD-10-CM | POA: Diagnosis not present

## 2014-05-31 DIAGNOSIS — R062 Wheezing: Secondary | ICD-10-CM | POA: Diagnosis not present

## 2014-05-31 DIAGNOSIS — Z951 Presence of aortocoronary bypass graft: Secondary | ICD-10-CM | POA: Diagnosis not present

## 2014-06-01 DIAGNOSIS — J4530 Mild persistent asthma, uncomplicated: Secondary | ICD-10-CM | POA: Diagnosis not present

## 2014-06-02 DIAGNOSIS — Z7901 Long term (current) use of anticoagulants: Secondary | ICD-10-CM | POA: Diagnosis not present

## 2014-06-14 DIAGNOSIS — J4530 Mild persistent asthma, uncomplicated: Secondary | ICD-10-CM | POA: Diagnosis not present

## 2014-06-16 DIAGNOSIS — Z7901 Long term (current) use of anticoagulants: Secondary | ICD-10-CM | POA: Diagnosis not present

## 2014-06-21 DIAGNOSIS — Z23 Encounter for immunization: Secondary | ICD-10-CM | POA: Diagnosis not present

## 2014-07-15 DIAGNOSIS — Z7901 Long term (current) use of anticoagulants: Secondary | ICD-10-CM | POA: Diagnosis not present

## 2014-07-27 DIAGNOSIS — R351 Nocturia: Secondary | ICD-10-CM | POA: Diagnosis not present

## 2014-07-27 DIAGNOSIS — R809 Proteinuria, unspecified: Secondary | ICD-10-CM | POA: Diagnosis not present

## 2014-07-27 DIAGNOSIS — C61 Malignant neoplasm of prostate: Secondary | ICD-10-CM | POA: Diagnosis not present

## 2014-08-04 DIAGNOSIS — K2270 Barrett's esophagus without dysplasia: Secondary | ICD-10-CM | POA: Diagnosis not present

## 2014-08-04 DIAGNOSIS — Z7901 Long term (current) use of anticoagulants: Secondary | ICD-10-CM | POA: Diagnosis not present

## 2014-08-16 DIAGNOSIS — Z7901 Long term (current) use of anticoagulants: Secondary | ICD-10-CM | POA: Diagnosis not present

## 2014-08-16 DIAGNOSIS — I4891 Unspecified atrial fibrillation: Secondary | ICD-10-CM | POA: Diagnosis not present

## 2014-08-25 DIAGNOSIS — I480 Paroxysmal atrial fibrillation: Secondary | ICD-10-CM | POA: Diagnosis not present

## 2014-08-25 DIAGNOSIS — Z7901 Long term (current) use of anticoagulants: Secondary | ICD-10-CM | POA: Diagnosis not present

## 2014-08-26 DIAGNOSIS — K2270 Barrett's esophagus without dysplasia: Secondary | ICD-10-CM | POA: Diagnosis not present

## 2014-09-02 DIAGNOSIS — Z7901 Long term (current) use of anticoagulants: Secondary | ICD-10-CM | POA: Diagnosis not present

## 2014-09-02 DIAGNOSIS — J4530 Mild persistent asthma, uncomplicated: Secondary | ICD-10-CM | POA: Diagnosis not present

## 2014-09-08 DIAGNOSIS — I10 Essential (primary) hypertension: Secondary | ICD-10-CM | POA: Diagnosis not present

## 2014-09-08 DIAGNOSIS — E119 Type 2 diabetes mellitus without complications: Secondary | ICD-10-CM | POA: Diagnosis not present

## 2014-09-08 DIAGNOSIS — J4520 Mild intermittent asthma, uncomplicated: Secondary | ICD-10-CM | POA: Diagnosis not present

## 2014-09-09 DIAGNOSIS — I4891 Unspecified atrial fibrillation: Secondary | ICD-10-CM | POA: Diagnosis not present

## 2014-09-09 DIAGNOSIS — Z7901 Long term (current) use of anticoagulants: Secondary | ICD-10-CM | POA: Diagnosis not present

## 2014-09-22 DIAGNOSIS — I480 Paroxysmal atrial fibrillation: Secondary | ICD-10-CM | POA: Diagnosis not present

## 2014-09-22 DIAGNOSIS — Z7901 Long term (current) use of anticoagulants: Secondary | ICD-10-CM | POA: Diagnosis not present

## 2014-10-04 DIAGNOSIS — C44319 Basal cell carcinoma of skin of other parts of face: Secondary | ICD-10-CM | POA: Diagnosis not present

## 2014-10-04 DIAGNOSIS — L814 Other melanin hyperpigmentation: Secondary | ICD-10-CM | POA: Diagnosis not present

## 2014-10-04 DIAGNOSIS — L570 Actinic keratosis: Secondary | ICD-10-CM | POA: Diagnosis not present

## 2014-10-20 DIAGNOSIS — I4891 Unspecified atrial fibrillation: Secondary | ICD-10-CM | POA: Diagnosis not present

## 2014-10-20 DIAGNOSIS — Z7901 Long term (current) use of anticoagulants: Secondary | ICD-10-CM | POA: Diagnosis not present

## 2014-11-17 DIAGNOSIS — Z7901 Long term (current) use of anticoagulants: Secondary | ICD-10-CM | POA: Diagnosis not present

## 2014-11-17 DIAGNOSIS — I4891 Unspecified atrial fibrillation: Secondary | ICD-10-CM | POA: Diagnosis not present

## 2014-11-22 DIAGNOSIS — J4530 Mild persistent asthma, uncomplicated: Secondary | ICD-10-CM | POA: Diagnosis not present

## 2014-12-01 DIAGNOSIS — Z7901 Long term (current) use of anticoagulants: Secondary | ICD-10-CM | POA: Diagnosis not present

## 2014-12-01 DIAGNOSIS — I482 Chronic atrial fibrillation: Secondary | ICD-10-CM | POA: Diagnosis not present

## 2014-12-09 DIAGNOSIS — I10 Essential (primary) hypertension: Secondary | ICD-10-CM | POA: Diagnosis not present

## 2014-12-09 DIAGNOSIS — E785 Hyperlipidemia, unspecified: Secondary | ICD-10-CM | POA: Diagnosis not present

## 2014-12-09 DIAGNOSIS — E119 Type 2 diabetes mellitus without complications: Secondary | ICD-10-CM | POA: Diagnosis not present

## 2015-01-03 DIAGNOSIS — I482 Chronic atrial fibrillation: Secondary | ICD-10-CM | POA: Diagnosis not present

## 2015-01-03 DIAGNOSIS — Z7901 Long term (current) use of anticoagulants: Secondary | ICD-10-CM | POA: Diagnosis not present

## 2015-01-25 DIAGNOSIS — C61 Malignant neoplasm of prostate: Secondary | ICD-10-CM | POA: Diagnosis not present

## 2015-01-25 DIAGNOSIS — R351 Nocturia: Secondary | ICD-10-CM | POA: Diagnosis not present

## 2015-01-25 DIAGNOSIS — R809 Proteinuria, unspecified: Secondary | ICD-10-CM | POA: Diagnosis not present

## 2015-01-31 DIAGNOSIS — I482 Chronic atrial fibrillation: Secondary | ICD-10-CM | POA: Diagnosis not present

## 2015-01-31 DIAGNOSIS — Z7901 Long term (current) use of anticoagulants: Secondary | ICD-10-CM | POA: Diagnosis not present

## 2015-02-24 DIAGNOSIS — J4530 Mild persistent asthma, uncomplicated: Secondary | ICD-10-CM | POA: Diagnosis not present

## 2015-02-28 DIAGNOSIS — Z7901 Long term (current) use of anticoagulants: Secondary | ICD-10-CM | POA: Diagnosis not present

## 2015-02-28 DIAGNOSIS — I482 Chronic atrial fibrillation: Secondary | ICD-10-CM | POA: Diagnosis not present

## 2015-03-13 DIAGNOSIS — E119 Type 2 diabetes mellitus without complications: Secondary | ICD-10-CM | POA: Diagnosis not present

## 2015-03-13 DIAGNOSIS — H2513 Age-related nuclear cataract, bilateral: Secondary | ICD-10-CM | POA: Diagnosis not present

## 2015-03-15 DIAGNOSIS — I2510 Atherosclerotic heart disease of native coronary artery without angina pectoris: Secondary | ICD-10-CM | POA: Diagnosis not present

## 2015-03-15 DIAGNOSIS — E119 Type 2 diabetes mellitus without complications: Secondary | ICD-10-CM | POA: Diagnosis not present

## 2015-03-15 DIAGNOSIS — I10 Essential (primary) hypertension: Secondary | ICD-10-CM | POA: Diagnosis not present

## 2015-03-15 DIAGNOSIS — G629 Polyneuropathy, unspecified: Secondary | ICD-10-CM | POA: Diagnosis not present

## 2015-03-15 DIAGNOSIS — C61 Malignant neoplasm of prostate: Secondary | ICD-10-CM | POA: Diagnosis not present

## 2015-03-15 DIAGNOSIS — J4530 Mild persistent asthma, uncomplicated: Secondary | ICD-10-CM | POA: Diagnosis not present

## 2015-03-15 DIAGNOSIS — M129 Arthropathy, unspecified: Secondary | ICD-10-CM | POA: Diagnosis not present

## 2015-03-15 DIAGNOSIS — G603 Idiopathic progressive neuropathy: Secondary | ICD-10-CM | POA: Diagnosis not present

## 2015-03-15 DIAGNOSIS — K2270 Barrett's esophagus without dysplasia: Secondary | ICD-10-CM | POA: Diagnosis not present

## 2015-03-15 DIAGNOSIS — Z23 Encounter for immunization: Secondary | ICD-10-CM | POA: Diagnosis not present

## 2015-03-15 DIAGNOSIS — I482 Chronic atrial fibrillation: Secondary | ICD-10-CM | POA: Diagnosis not present

## 2015-03-15 DIAGNOSIS — Z1389 Encounter for screening for other disorder: Secondary | ICD-10-CM | POA: Diagnosis not present

## 2015-03-27 DIAGNOSIS — I482 Chronic atrial fibrillation: Secondary | ICD-10-CM | POA: Diagnosis not present

## 2015-03-27 DIAGNOSIS — Z7901 Long term (current) use of anticoagulants: Secondary | ICD-10-CM | POA: Diagnosis not present

## 2015-03-29 DIAGNOSIS — Z23 Encounter for immunization: Secondary | ICD-10-CM | POA: Diagnosis not present

## 2015-04-05 DIAGNOSIS — D485 Neoplasm of uncertain behavior of skin: Secondary | ICD-10-CM | POA: Diagnosis not present

## 2015-04-05 DIAGNOSIS — L570 Actinic keratosis: Secondary | ICD-10-CM | POA: Diagnosis not present

## 2015-04-24 DIAGNOSIS — I482 Chronic atrial fibrillation: Secondary | ICD-10-CM | POA: Diagnosis not present

## 2015-04-28 DIAGNOSIS — H2512 Age-related nuclear cataract, left eye: Secondary | ICD-10-CM | POA: Diagnosis not present

## 2015-05-19 DIAGNOSIS — J4520 Mild intermittent asthma, uncomplicated: Secondary | ICD-10-CM | POA: Diagnosis not present

## 2015-05-22 DIAGNOSIS — I482 Chronic atrial fibrillation: Secondary | ICD-10-CM | POA: Diagnosis not present

## 2015-05-22 DIAGNOSIS — Z7901 Long term (current) use of anticoagulants: Secondary | ICD-10-CM | POA: Diagnosis not present

## 2015-05-23 DIAGNOSIS — H2512 Age-related nuclear cataract, left eye: Secondary | ICD-10-CM | POA: Diagnosis not present

## 2015-05-23 DIAGNOSIS — Z951 Presence of aortocoronary bypass graft: Secondary | ICD-10-CM | POA: Diagnosis not present

## 2015-05-23 DIAGNOSIS — I4891 Unspecified atrial fibrillation: Secondary | ICD-10-CM | POA: Diagnosis not present

## 2015-05-23 DIAGNOSIS — Z87891 Personal history of nicotine dependence: Secondary | ICD-10-CM | POA: Diagnosis not present

## 2015-05-23 DIAGNOSIS — J45909 Unspecified asthma, uncomplicated: Secondary | ICD-10-CM | POA: Diagnosis not present

## 2015-05-23 DIAGNOSIS — H9190 Unspecified hearing loss, unspecified ear: Secondary | ICD-10-CM | POA: Diagnosis not present

## 2015-05-23 DIAGNOSIS — E785 Hyperlipidemia, unspecified: Secondary | ICD-10-CM | POA: Diagnosis not present

## 2015-05-23 DIAGNOSIS — Z7901 Long term (current) use of anticoagulants: Secondary | ICD-10-CM | POA: Diagnosis not present

## 2015-05-23 DIAGNOSIS — I10 Essential (primary) hypertension: Secondary | ICD-10-CM | POA: Diagnosis not present

## 2015-05-23 DIAGNOSIS — E119 Type 2 diabetes mellitus without complications: Secondary | ICD-10-CM | POA: Diagnosis not present

## 2015-05-23 DIAGNOSIS — N400 Enlarged prostate without lower urinary tract symptoms: Secondary | ICD-10-CM | POA: Diagnosis not present

## 2015-06-09 DIAGNOSIS — J00 Acute nasopharyngitis [common cold]: Secondary | ICD-10-CM | POA: Diagnosis not present

## 2015-06-14 DIAGNOSIS — E1121 Type 2 diabetes mellitus with diabetic nephropathy: Secondary | ICD-10-CM | POA: Diagnosis not present

## 2015-06-14 DIAGNOSIS — I10 Essential (primary) hypertension: Secondary | ICD-10-CM | POA: Diagnosis not present

## 2015-06-14 DIAGNOSIS — I480 Paroxysmal atrial fibrillation: Secondary | ICD-10-CM | POA: Diagnosis not present

## 2015-06-19 DIAGNOSIS — Z7901 Long term (current) use of anticoagulants: Secondary | ICD-10-CM | POA: Diagnosis not present

## 2015-06-19 DIAGNOSIS — I482 Chronic atrial fibrillation: Secondary | ICD-10-CM | POA: Diagnosis not present

## 2015-06-27 DIAGNOSIS — E785 Hyperlipidemia, unspecified: Secondary | ICD-10-CM | POA: Diagnosis not present

## 2015-06-27 DIAGNOSIS — E119 Type 2 diabetes mellitus without complications: Secondary | ICD-10-CM | POA: Diagnosis not present

## 2015-06-27 DIAGNOSIS — Z7901 Long term (current) use of anticoagulants: Secondary | ICD-10-CM | POA: Diagnosis not present

## 2015-06-27 DIAGNOSIS — Z955 Presence of coronary angioplasty implant and graft: Secondary | ICD-10-CM | POA: Diagnosis not present

## 2015-06-27 DIAGNOSIS — H9193 Unspecified hearing loss, bilateral: Secondary | ICD-10-CM | POA: Diagnosis not present

## 2015-06-27 DIAGNOSIS — Z87891 Personal history of nicotine dependence: Secondary | ICD-10-CM | POA: Diagnosis not present

## 2015-06-27 DIAGNOSIS — H2511 Age-related nuclear cataract, right eye: Secondary | ICD-10-CM | POA: Diagnosis not present

## 2015-06-27 DIAGNOSIS — J45909 Unspecified asthma, uncomplicated: Secondary | ICD-10-CM | POA: Diagnosis not present

## 2015-06-27 DIAGNOSIS — Z79899 Other long term (current) drug therapy: Secondary | ICD-10-CM | POA: Diagnosis not present

## 2015-06-27 DIAGNOSIS — I4891 Unspecified atrial fibrillation: Secondary | ICD-10-CM | POA: Diagnosis not present

## 2015-07-04 DIAGNOSIS — I482 Chronic atrial fibrillation: Secondary | ICD-10-CM | POA: Diagnosis not present

## 2015-07-04 DIAGNOSIS — Z7901 Long term (current) use of anticoagulants: Secondary | ICD-10-CM | POA: Diagnosis not present

## 2015-07-21 DIAGNOSIS — H35351 Cystoid macular degeneration, right eye: Secondary | ICD-10-CM | POA: Diagnosis not present

## 2015-07-31 DIAGNOSIS — R351 Nocturia: Secondary | ICD-10-CM | POA: Diagnosis not present

## 2015-07-31 DIAGNOSIS — C61 Malignant neoplasm of prostate: Secondary | ICD-10-CM | POA: Diagnosis not present

## 2015-08-01 DIAGNOSIS — I482 Chronic atrial fibrillation: Secondary | ICD-10-CM | POA: Diagnosis not present

## 2015-08-01 DIAGNOSIS — Z7901 Long term (current) use of anticoagulants: Secondary | ICD-10-CM | POA: Diagnosis not present

## 2015-08-11 DIAGNOSIS — H524 Presbyopia: Secondary | ICD-10-CM | POA: Diagnosis not present

## 2015-08-29 DIAGNOSIS — Z7901 Long term (current) use of anticoagulants: Secondary | ICD-10-CM | POA: Diagnosis not present

## 2015-08-29 DIAGNOSIS — I482 Chronic atrial fibrillation: Secondary | ICD-10-CM | POA: Diagnosis not present

## 2015-09-20 DIAGNOSIS — E7800 Pure hypercholesterolemia, unspecified: Secondary | ICD-10-CM | POA: Diagnosis not present

## 2015-09-20 DIAGNOSIS — E1129 Type 2 diabetes mellitus with other diabetic kidney complication: Secondary | ICD-10-CM | POA: Diagnosis not present

## 2015-09-20 DIAGNOSIS — R739 Hyperglycemia, unspecified: Secondary | ICD-10-CM | POA: Diagnosis not present

## 2015-09-20 DIAGNOSIS — I10 Essential (primary) hypertension: Secondary | ICD-10-CM | POA: Diagnosis not present

## 2015-09-20 DIAGNOSIS — R809 Proteinuria, unspecified: Secondary | ICD-10-CM | POA: Diagnosis not present

## 2015-09-27 DIAGNOSIS — I482 Chronic atrial fibrillation: Secondary | ICD-10-CM | POA: Diagnosis not present

## 2015-09-27 DIAGNOSIS — Z7901 Long term (current) use of anticoagulants: Secondary | ICD-10-CM | POA: Diagnosis not present

## 2015-10-04 DIAGNOSIS — L570 Actinic keratosis: Secondary | ICD-10-CM | POA: Diagnosis not present

## 2015-10-26 DIAGNOSIS — I482 Chronic atrial fibrillation: Secondary | ICD-10-CM | POA: Diagnosis not present

## 2015-10-26 DIAGNOSIS — Z7901 Long term (current) use of anticoagulants: Secondary | ICD-10-CM | POA: Diagnosis not present

## 2015-10-30 DIAGNOSIS — L723 Sebaceous cyst: Secondary | ICD-10-CM | POA: Diagnosis not present

## 2015-10-30 DIAGNOSIS — J0100 Acute maxillary sinusitis, unspecified: Secondary | ICD-10-CM | POA: Diagnosis not present

## 2015-11-01 DIAGNOSIS — I4891 Unspecified atrial fibrillation: Secondary | ICD-10-CM | POA: Diagnosis not present

## 2015-11-01 DIAGNOSIS — L723 Sebaceous cyst: Secondary | ICD-10-CM | POA: Diagnosis not present

## 2015-11-14 DIAGNOSIS — J4520 Mild intermittent asthma, uncomplicated: Secondary | ICD-10-CM | POA: Diagnosis not present

## 2015-11-23 DIAGNOSIS — I4892 Unspecified atrial flutter: Secondary | ICD-10-CM | POA: Diagnosis not present

## 2015-11-23 DIAGNOSIS — Z7901 Long term (current) use of anticoagulants: Secondary | ICD-10-CM | POA: Diagnosis not present

## 2015-11-28 DIAGNOSIS — L720 Epidermal cyst: Secondary | ICD-10-CM | POA: Diagnosis not present

## 2015-11-28 DIAGNOSIS — L723 Sebaceous cyst: Secondary | ICD-10-CM | POA: Diagnosis not present

## 2015-11-28 DIAGNOSIS — I4891 Unspecified atrial fibrillation: Secondary | ICD-10-CM | POA: Diagnosis not present

## 2015-12-11 DIAGNOSIS — I482 Chronic atrial fibrillation: Secondary | ICD-10-CM | POA: Diagnosis not present

## 2015-12-11 DIAGNOSIS — Z7901 Long term (current) use of anticoagulants: Secondary | ICD-10-CM | POA: Diagnosis not present

## 2015-12-16 DIAGNOSIS — M545 Low back pain: Secondary | ICD-10-CM | POA: Diagnosis not present

## 2015-12-16 DIAGNOSIS — S32000A Wedge compression fracture of unspecified lumbar vertebra, initial encounter for closed fracture: Secondary | ICD-10-CM | POA: Diagnosis not present

## 2015-12-19 DIAGNOSIS — S32000A Wedge compression fracture of unspecified lumbar vertebra, initial encounter for closed fracture: Secondary | ICD-10-CM | POA: Diagnosis not present

## 2015-12-26 DIAGNOSIS — S32000A Wedge compression fracture of unspecified lumbar vertebra, initial encounter for closed fracture: Secondary | ICD-10-CM | POA: Diagnosis not present

## 2015-12-26 DIAGNOSIS — M545 Low back pain: Secondary | ICD-10-CM | POA: Diagnosis not present

## 2015-12-26 DIAGNOSIS — M4806 Spinal stenosis, lumbar region: Secondary | ICD-10-CM | POA: Diagnosis not present

## 2015-12-26 DIAGNOSIS — W19XXXA Unspecified fall, initial encounter: Secondary | ICD-10-CM | POA: Diagnosis not present

## 2015-12-26 DIAGNOSIS — S3992XA Unspecified injury of lower back, initial encounter: Secondary | ICD-10-CM | POA: Diagnosis not present

## 2015-12-26 DIAGNOSIS — I700 Atherosclerosis of aorta: Secondary | ICD-10-CM | POA: Diagnosis not present

## 2015-12-27 DIAGNOSIS — S32000A Wedge compression fracture of unspecified lumbar vertebra, initial encounter for closed fracture: Secondary | ICD-10-CM | POA: Diagnosis not present

## 2015-12-29 DIAGNOSIS — I10 Essential (primary) hypertension: Secondary | ICD-10-CM | POA: Diagnosis not present

## 2015-12-29 DIAGNOSIS — E559 Vitamin D deficiency, unspecified: Secondary | ICD-10-CM | POA: Diagnosis not present

## 2015-12-29 DIAGNOSIS — M4850XD Collapsed vertebra, not elsewhere classified, site unspecified, subsequent encounter for fracture with routine healing: Secondary | ICD-10-CM | POA: Diagnosis not present

## 2016-01-01 DIAGNOSIS — I10 Essential (primary) hypertension: Secondary | ICD-10-CM | POA: Diagnosis not present

## 2016-01-01 DIAGNOSIS — N401 Enlarged prostate with lower urinary tract symptoms: Secondary | ICD-10-CM | POA: Diagnosis not present

## 2016-01-01 DIAGNOSIS — Z79899 Other long term (current) drug therapy: Secondary | ICD-10-CM | POA: Diagnosis not present

## 2016-01-01 DIAGNOSIS — E785 Hyperlipidemia, unspecified: Secondary | ICD-10-CM | POA: Diagnosis not present

## 2016-01-01 DIAGNOSIS — I2510 Atherosclerotic heart disease of native coronary artery without angina pectoris: Secondary | ICD-10-CM | POA: Diagnosis not present

## 2016-01-01 DIAGNOSIS — E119 Type 2 diabetes mellitus without complications: Secondary | ICD-10-CM | POA: Diagnosis not present

## 2016-01-01 DIAGNOSIS — J449 Chronic obstructive pulmonary disease, unspecified: Secondary | ICD-10-CM | POA: Diagnosis not present

## 2016-01-01 DIAGNOSIS — I4891 Unspecified atrial fibrillation: Secondary | ICD-10-CM | POA: Diagnosis not present

## 2016-01-01 DIAGNOSIS — J45909 Unspecified asthma, uncomplicated: Secondary | ICD-10-CM | POA: Diagnosis not present

## 2016-01-01 DIAGNOSIS — S32020A Wedge compression fracture of second lumbar vertebra, initial encounter for closed fracture: Secondary | ICD-10-CM | POA: Diagnosis not present

## 2016-01-01 DIAGNOSIS — Z7901 Long term (current) use of anticoagulants: Secondary | ICD-10-CM | POA: Diagnosis not present

## 2016-01-04 DIAGNOSIS — I482 Chronic atrial fibrillation: Secondary | ICD-10-CM | POA: Diagnosis not present

## 2016-01-04 DIAGNOSIS — Z7901 Long term (current) use of anticoagulants: Secondary | ICD-10-CM | POA: Diagnosis not present

## 2016-01-10 DIAGNOSIS — E559 Vitamin D deficiency, unspecified: Secondary | ICD-10-CM | POA: Diagnosis not present

## 2016-01-10 DIAGNOSIS — M4850XD Collapsed vertebra, not elsewhere classified, site unspecified, subsequent encounter for fracture with routine healing: Secondary | ICD-10-CM | POA: Diagnosis not present

## 2016-01-10 DIAGNOSIS — M8588 Other specified disorders of bone density and structure, other site: Secondary | ICD-10-CM | POA: Diagnosis not present

## 2016-01-10 DIAGNOSIS — M8589 Other specified disorders of bone density and structure, multiple sites: Secondary | ICD-10-CM | POA: Diagnosis not present

## 2016-01-17 DIAGNOSIS — M818 Other osteoporosis without current pathological fracture: Secondary | ICD-10-CM | POA: Diagnosis not present

## 2016-01-19 DIAGNOSIS — Z7901 Long term (current) use of anticoagulants: Secondary | ICD-10-CM | POA: Diagnosis not present

## 2016-01-19 DIAGNOSIS — I482 Chronic atrial fibrillation: Secondary | ICD-10-CM | POA: Diagnosis not present

## 2016-01-22 DIAGNOSIS — Z7901 Long term (current) use of anticoagulants: Secondary | ICD-10-CM | POA: Diagnosis not present

## 2016-01-22 DIAGNOSIS — I482 Chronic atrial fibrillation: Secondary | ICD-10-CM | POA: Diagnosis not present

## 2016-01-29 DIAGNOSIS — C61 Malignant neoplasm of prostate: Secondary | ICD-10-CM | POA: Diagnosis not present

## 2016-02-01 DIAGNOSIS — H26493 Other secondary cataract, bilateral: Secondary | ICD-10-CM | POA: Diagnosis not present

## 2016-02-05 DIAGNOSIS — Z7901 Long term (current) use of anticoagulants: Secondary | ICD-10-CM | POA: Diagnosis not present

## 2016-02-05 DIAGNOSIS — I482 Chronic atrial fibrillation: Secondary | ICD-10-CM | POA: Diagnosis not present

## 2016-02-23 ENCOUNTER — Other Ambulatory Visit: Payer: Self-pay

## 2016-03-05 DIAGNOSIS — Z7901 Long term (current) use of anticoagulants: Secondary | ICD-10-CM | POA: Diagnosis not present

## 2016-03-05 DIAGNOSIS — I482 Chronic atrial fibrillation: Secondary | ICD-10-CM | POA: Diagnosis not present

## 2016-04-04 DIAGNOSIS — Z7901 Long term (current) use of anticoagulants: Secondary | ICD-10-CM | POA: Diagnosis not present

## 2016-04-04 DIAGNOSIS — I482 Chronic atrial fibrillation: Secondary | ICD-10-CM | POA: Diagnosis not present

## 2016-04-09 DIAGNOSIS — Z23 Encounter for immunization: Secondary | ICD-10-CM | POA: Diagnosis not present

## 2016-04-09 DIAGNOSIS — E1129 Type 2 diabetes mellitus with other diabetic kidney complication: Secondary | ICD-10-CM | POA: Diagnosis not present

## 2016-04-09 DIAGNOSIS — R809 Proteinuria, unspecified: Secondary | ICD-10-CM | POA: Diagnosis not present

## 2016-04-09 DIAGNOSIS — M810 Age-related osteoporosis without current pathological fracture: Secondary | ICD-10-CM | POA: Diagnosis not present

## 2016-04-09 DIAGNOSIS — I10 Essential (primary) hypertension: Secondary | ICD-10-CM | POA: Diagnosis not present

## 2016-04-09 DIAGNOSIS — E119 Type 2 diabetes mellitus without complications: Secondary | ICD-10-CM | POA: Diagnosis not present

## 2016-04-11 DIAGNOSIS — L853 Xerosis cutis: Secondary | ICD-10-CM | POA: Diagnosis not present

## 2016-04-11 DIAGNOSIS — L300 Nummular dermatitis: Secondary | ICD-10-CM | POA: Diagnosis not present

## 2016-04-11 DIAGNOSIS — L820 Inflamed seborrheic keratosis: Secondary | ICD-10-CM | POA: Diagnosis not present

## 2016-04-18 DIAGNOSIS — I482 Chronic atrial fibrillation: Secondary | ICD-10-CM | POA: Diagnosis not present

## 2016-04-18 DIAGNOSIS — Z7901 Long term (current) use of anticoagulants: Secondary | ICD-10-CM | POA: Diagnosis not present

## 2016-05-02 DIAGNOSIS — Z7901 Long term (current) use of anticoagulants: Secondary | ICD-10-CM | POA: Diagnosis not present

## 2016-05-02 DIAGNOSIS — I482 Chronic atrial fibrillation: Secondary | ICD-10-CM | POA: Diagnosis not present

## 2016-05-14 DIAGNOSIS — Z7901 Long term (current) use of anticoagulants: Secondary | ICD-10-CM | POA: Diagnosis not present

## 2016-05-15 DIAGNOSIS — D122 Benign neoplasm of ascending colon: Secondary | ICD-10-CM | POA: Diagnosis not present

## 2016-05-15 DIAGNOSIS — Z86010 Personal history of colonic polyps: Secondary | ICD-10-CM | POA: Diagnosis not present

## 2016-05-15 DIAGNOSIS — K5730 Diverticulosis of large intestine without perforation or abscess without bleeding: Secondary | ICD-10-CM | POA: Diagnosis not present

## 2016-05-15 DIAGNOSIS — Z1211 Encounter for screening for malignant neoplasm of colon: Secondary | ICD-10-CM | POA: Diagnosis not present

## 2016-05-15 DIAGNOSIS — K635 Polyp of colon: Secondary | ICD-10-CM | POA: Diagnosis not present

## 2016-05-20 DIAGNOSIS — Z7901 Long term (current) use of anticoagulants: Secondary | ICD-10-CM | POA: Diagnosis not present

## 2016-05-20 DIAGNOSIS — I480 Paroxysmal atrial fibrillation: Secondary | ICD-10-CM | POA: Diagnosis not present

## 2016-06-06 DIAGNOSIS — E785 Hyperlipidemia, unspecified: Secondary | ICD-10-CM | POA: Insufficient documentation

## 2016-06-06 DIAGNOSIS — E119 Type 2 diabetes mellitus without complications: Secondary | ICD-10-CM | POA: Insufficient documentation

## 2016-06-06 DIAGNOSIS — I10 Essential (primary) hypertension: Secondary | ICD-10-CM

## 2016-06-06 DIAGNOSIS — I4821 Permanent atrial fibrillation: Secondary | ICD-10-CM | POA: Insufficient documentation

## 2016-06-06 DIAGNOSIS — I25118 Atherosclerotic heart disease of native coronary artery with other forms of angina pectoris: Secondary | ICD-10-CM | POA: Insufficient documentation

## 2016-06-06 DIAGNOSIS — I208 Other forms of angina pectoris: Secondary | ICD-10-CM | POA: Diagnosis not present

## 2016-06-06 DIAGNOSIS — I482 Chronic atrial fibrillation: Secondary | ICD-10-CM | POA: Diagnosis not present

## 2016-06-06 DIAGNOSIS — E784 Other hyperlipidemia: Secondary | ICD-10-CM | POA: Diagnosis not present

## 2016-06-06 HISTORY — DX: Permanent atrial fibrillation: I48.21

## 2016-06-06 HISTORY — DX: Essential (primary) hypertension: I10

## 2016-06-06 HISTORY — DX: Type 2 diabetes mellitus without complications: E11.9

## 2016-06-06 HISTORY — DX: Atherosclerotic heart disease of native coronary artery with other forms of angina pectoris: I25.118

## 2016-06-06 HISTORY — DX: Hyperlipidemia, unspecified: E78.5

## 2016-06-10 DIAGNOSIS — I208 Other forms of angina pectoris: Secondary | ICD-10-CM | POA: Diagnosis not present

## 2016-06-10 DIAGNOSIS — I10 Essential (primary) hypertension: Secondary | ICD-10-CM | POA: Diagnosis not present

## 2016-06-10 DIAGNOSIS — E119 Type 2 diabetes mellitus without complications: Secondary | ICD-10-CM | POA: Diagnosis not present

## 2016-06-10 DIAGNOSIS — I482 Chronic atrial fibrillation: Secondary | ICD-10-CM | POA: Diagnosis not present

## 2016-06-10 DIAGNOSIS — I25118 Atherosclerotic heart disease of native coronary artery with other forms of angina pectoris: Secondary | ICD-10-CM | POA: Diagnosis not present

## 2016-06-10 DIAGNOSIS — E784 Other hyperlipidemia: Secondary | ICD-10-CM | POA: Diagnosis not present

## 2016-06-11 DIAGNOSIS — I482 Chronic atrial fibrillation: Secondary | ICD-10-CM | POA: Diagnosis not present

## 2016-06-11 DIAGNOSIS — I25118 Atherosclerotic heart disease of native coronary artery with other forms of angina pectoris: Secondary | ICD-10-CM | POA: Diagnosis not present

## 2016-06-11 DIAGNOSIS — E119 Type 2 diabetes mellitus without complications: Secondary | ICD-10-CM | POA: Diagnosis not present

## 2016-06-11 DIAGNOSIS — I208 Other forms of angina pectoris: Secondary | ICD-10-CM | POA: Diagnosis not present

## 2016-06-11 DIAGNOSIS — I10 Essential (primary) hypertension: Secondary | ICD-10-CM | POA: Diagnosis not present

## 2016-06-11 DIAGNOSIS — E784 Other hyperlipidemia: Secondary | ICD-10-CM | POA: Diagnosis not present

## 2016-06-18 DIAGNOSIS — Z7901 Long term (current) use of anticoagulants: Secondary | ICD-10-CM | POA: Diagnosis not present

## 2016-06-18 DIAGNOSIS — I482 Chronic atrial fibrillation: Secondary | ICD-10-CM | POA: Diagnosis not present

## 2016-07-16 DIAGNOSIS — Z7901 Long term (current) use of anticoagulants: Secondary | ICD-10-CM | POA: Diagnosis not present

## 2016-07-16 DIAGNOSIS — I482 Chronic atrial fibrillation: Secondary | ICD-10-CM | POA: Diagnosis not present

## 2016-07-23 DIAGNOSIS — M810 Age-related osteoporosis without current pathological fracture: Secondary | ICD-10-CM | POA: Diagnosis not present

## 2016-07-23 DIAGNOSIS — E119 Type 2 diabetes mellitus without complications: Secondary | ICD-10-CM | POA: Diagnosis not present

## 2016-07-23 DIAGNOSIS — E7800 Pure hypercholesterolemia, unspecified: Secondary | ICD-10-CM | POA: Diagnosis not present

## 2016-07-23 DIAGNOSIS — Z8546 Personal history of malignant neoplasm of prostate: Secondary | ICD-10-CM | POA: Diagnosis not present

## 2016-07-23 DIAGNOSIS — K22719 Barrett's esophagus with dysplasia, unspecified: Secondary | ICD-10-CM | POA: Diagnosis not present

## 2016-07-23 DIAGNOSIS — E559 Vitamin D deficiency, unspecified: Secondary | ICD-10-CM | POA: Diagnosis not present

## 2016-07-23 DIAGNOSIS — Z6829 Body mass index (BMI) 29.0-29.9, adult: Secondary | ICD-10-CM | POA: Diagnosis not present

## 2016-07-23 DIAGNOSIS — Z1389 Encounter for screening for other disorder: Secondary | ICD-10-CM | POA: Diagnosis not present

## 2016-07-23 DIAGNOSIS — I10 Essential (primary) hypertension: Secondary | ICD-10-CM | POA: Diagnosis not present

## 2016-07-23 DIAGNOSIS — J4520 Mild intermittent asthma, uncomplicated: Secondary | ICD-10-CM | POA: Diagnosis not present

## 2016-07-23 DIAGNOSIS — I25810 Atherosclerosis of coronary artery bypass graft(s) without angina pectoris: Secondary | ICD-10-CM | POA: Diagnosis not present

## 2016-07-23 DIAGNOSIS — M150 Primary generalized (osteo)arthritis: Secondary | ICD-10-CM | POA: Diagnosis not present

## 2016-08-02 DIAGNOSIS — C61 Malignant neoplasm of prostate: Secondary | ICD-10-CM | POA: Diagnosis not present

## 2016-08-02 DIAGNOSIS — M810 Age-related osteoporosis without current pathological fracture: Secondary | ICD-10-CM | POA: Diagnosis not present

## 2016-08-02 DIAGNOSIS — R351 Nocturia: Secondary | ICD-10-CM | POA: Diagnosis not present

## 2016-08-13 DIAGNOSIS — I482 Chronic atrial fibrillation: Secondary | ICD-10-CM | POA: Diagnosis not present

## 2016-08-13 DIAGNOSIS — Z7901 Long term (current) use of anticoagulants: Secondary | ICD-10-CM | POA: Diagnosis not present

## 2016-08-27 DIAGNOSIS — I482 Chronic atrial fibrillation: Secondary | ICD-10-CM | POA: Diagnosis not present

## 2016-08-27 DIAGNOSIS — Z7901 Long term (current) use of anticoagulants: Secondary | ICD-10-CM | POA: Diagnosis not present

## 2016-09-03 DIAGNOSIS — K2270 Barrett's esophagus without dysplasia: Secondary | ICD-10-CM | POA: Diagnosis not present

## 2016-09-17 DIAGNOSIS — Z7901 Long term (current) use of anticoagulants: Secondary | ICD-10-CM | POA: Diagnosis not present

## 2016-09-18 DIAGNOSIS — K3189 Other diseases of stomach and duodenum: Secondary | ICD-10-CM | POA: Diagnosis not present

## 2016-09-18 DIAGNOSIS — K2270 Barrett's esophagus without dysplasia: Secondary | ICD-10-CM | POA: Diagnosis not present

## 2016-09-24 DIAGNOSIS — I482 Chronic atrial fibrillation: Secondary | ICD-10-CM | POA: Diagnosis not present

## 2016-09-24 DIAGNOSIS — Z7901 Long term (current) use of anticoagulants: Secondary | ICD-10-CM | POA: Diagnosis not present

## 2016-10-08 DIAGNOSIS — I482 Chronic atrial fibrillation: Secondary | ICD-10-CM | POA: Diagnosis not present

## 2016-10-08 DIAGNOSIS — Z7901 Long term (current) use of anticoagulants: Secondary | ICD-10-CM | POA: Diagnosis not present

## 2016-10-24 DIAGNOSIS — K4090 Unilateral inguinal hernia, without obstruction or gangrene, not specified as recurrent: Secondary | ICD-10-CM | POA: Diagnosis not present

## 2016-10-24 DIAGNOSIS — I10 Essential (primary) hypertension: Secondary | ICD-10-CM | POA: Diagnosis not present

## 2016-10-24 DIAGNOSIS — R1011 Right upper quadrant pain: Secondary | ICD-10-CM | POA: Diagnosis not present

## 2016-10-25 DIAGNOSIS — M1612 Unilateral primary osteoarthritis, left hip: Secondary | ICD-10-CM | POA: Diagnosis not present

## 2016-10-25 DIAGNOSIS — M545 Low back pain: Secondary | ICD-10-CM | POA: Diagnosis not present

## 2016-10-25 DIAGNOSIS — M25552 Pain in left hip: Secondary | ICD-10-CM | POA: Diagnosis not present

## 2016-10-25 DIAGNOSIS — M1611 Unilateral primary osteoarthritis, right hip: Secondary | ICD-10-CM | POA: Diagnosis not present

## 2016-10-25 DIAGNOSIS — M25551 Pain in right hip: Secondary | ICD-10-CM | POA: Diagnosis not present

## 2016-10-30 DIAGNOSIS — R1011 Right upper quadrant pain: Secondary | ICD-10-CM | POA: Diagnosis not present

## 2016-10-30 DIAGNOSIS — K8020 Calculus of gallbladder without cholecystitis without obstruction: Secondary | ICD-10-CM | POA: Diagnosis not present

## 2016-10-30 DIAGNOSIS — K4090 Unilateral inguinal hernia, without obstruction or gangrene, not specified as recurrent: Secondary | ICD-10-CM | POA: Diagnosis not present

## 2016-11-05 DIAGNOSIS — I482 Chronic atrial fibrillation: Secondary | ICD-10-CM | POA: Diagnosis not present

## 2016-11-05 DIAGNOSIS — Z7901 Long term (current) use of anticoagulants: Secondary | ICD-10-CM | POA: Diagnosis not present

## 2016-11-28 DIAGNOSIS — Z7901 Long term (current) use of anticoagulants: Secondary | ICD-10-CM | POA: Diagnosis not present

## 2016-11-28 DIAGNOSIS — I10 Essential (primary) hypertension: Secondary | ICD-10-CM | POA: Diagnosis not present

## 2016-11-28 DIAGNOSIS — E785 Hyperlipidemia, unspecified: Secondary | ICD-10-CM | POA: Diagnosis not present

## 2016-11-28 DIAGNOSIS — E119 Type 2 diabetes mellitus without complications: Secondary | ICD-10-CM | POA: Diagnosis not present

## 2016-11-28 DIAGNOSIS — I25118 Atherosclerotic heart disease of native coronary artery with other forms of angina pectoris: Secondary | ICD-10-CM | POA: Diagnosis not present

## 2016-11-28 DIAGNOSIS — I482 Chronic atrial fibrillation: Secondary | ICD-10-CM | POA: Diagnosis not present

## 2016-12-12 DIAGNOSIS — Z7901 Long term (current) use of anticoagulants: Secondary | ICD-10-CM | POA: Diagnosis not present

## 2016-12-12 DIAGNOSIS — I482 Chronic atrial fibrillation: Secondary | ICD-10-CM | POA: Diagnosis not present

## 2016-12-13 DIAGNOSIS — Z7901 Long term (current) use of anticoagulants: Secondary | ICD-10-CM | POA: Diagnosis not present

## 2016-12-13 DIAGNOSIS — I482 Chronic atrial fibrillation: Secondary | ICD-10-CM | POA: Diagnosis not present

## 2016-12-16 DIAGNOSIS — I482 Chronic atrial fibrillation: Secondary | ICD-10-CM | POA: Diagnosis not present

## 2016-12-16 DIAGNOSIS — Z7901 Long term (current) use of anticoagulants: Secondary | ICD-10-CM | POA: Diagnosis not present

## 2016-12-18 DIAGNOSIS — I480 Paroxysmal atrial fibrillation: Secondary | ICD-10-CM | POA: Diagnosis not present

## 2016-12-18 DIAGNOSIS — Z7901 Long term (current) use of anticoagulants: Secondary | ICD-10-CM | POA: Diagnosis not present

## 2016-12-18 DIAGNOSIS — I482 Chronic atrial fibrillation: Secondary | ICD-10-CM | POA: Diagnosis not present

## 2016-12-23 DIAGNOSIS — I480 Paroxysmal atrial fibrillation: Secondary | ICD-10-CM | POA: Diagnosis not present

## 2016-12-23 DIAGNOSIS — Z7901 Long term (current) use of anticoagulants: Secondary | ICD-10-CM | POA: Diagnosis not present

## 2016-12-30 DIAGNOSIS — Z7901 Long term (current) use of anticoagulants: Secondary | ICD-10-CM | POA: Diagnosis not present

## 2016-12-30 DIAGNOSIS — I480 Paroxysmal atrial fibrillation: Secondary | ICD-10-CM | POA: Diagnosis not present

## 2017-01-07 ENCOUNTER — Other Ambulatory Visit: Payer: Self-pay

## 2017-01-07 MED ORDER — CARVEDILOL 3.125 MG PO TABS: 3.1250 mg | ORAL_TABLET | Freq: Two times a day (BID) | ORAL | 3 refills | Status: DC

## 2017-01-09 DIAGNOSIS — K529 Noninfective gastroenteritis and colitis, unspecified: Secondary | ICD-10-CM | POA: Diagnosis not present

## 2017-01-24 DIAGNOSIS — M810 Age-related osteoporosis without current pathological fracture: Secondary | ICD-10-CM | POA: Diagnosis not present

## 2017-01-27 DIAGNOSIS — I480 Paroxysmal atrial fibrillation: Secondary | ICD-10-CM | POA: Diagnosis not present

## 2017-01-27 DIAGNOSIS — Z7901 Long term (current) use of anticoagulants: Secondary | ICD-10-CM | POA: Diagnosis not present

## 2017-02-03 DIAGNOSIS — C61 Malignant neoplasm of prostate: Secondary | ICD-10-CM | POA: Diagnosis not present

## 2017-02-03 DIAGNOSIS — R351 Nocturia: Secondary | ICD-10-CM | POA: Diagnosis not present

## 2017-02-10 DIAGNOSIS — M810 Age-related osteoporosis without current pathological fracture: Secondary | ICD-10-CM | POA: Diagnosis not present

## 2017-02-24 DIAGNOSIS — Z7901 Long term (current) use of anticoagulants: Secondary | ICD-10-CM | POA: Diagnosis not present

## 2017-02-24 DIAGNOSIS — I482 Chronic atrial fibrillation: Secondary | ICD-10-CM | POA: Diagnosis not present

## 2017-03-04 ENCOUNTER — Encounter: Payer: Self-pay | Admitting: Cardiology

## 2017-03-04 ENCOUNTER — Ambulatory Visit (INDEPENDENT_AMBULATORY_CARE_PROVIDER_SITE_OTHER): Payer: Medicare Other | Admitting: Cardiology

## 2017-03-04 ENCOUNTER — Other Ambulatory Visit: Payer: Self-pay

## 2017-03-04 VITALS — BP 130/62 | HR 51 | Ht 69.0 in | Wt 202.0 lb

## 2017-03-04 DIAGNOSIS — I482 Chronic atrial fibrillation: Secondary | ICD-10-CM

## 2017-03-04 DIAGNOSIS — Z7901 Long term (current) use of anticoagulants: Secondary | ICD-10-CM | POA: Diagnosis not present

## 2017-03-04 DIAGNOSIS — I10 Essential (primary) hypertension: Secondary | ICD-10-CM | POA: Diagnosis not present

## 2017-03-04 DIAGNOSIS — C61 Malignant neoplasm of prostate: Secondary | ICD-10-CM | POA: Diagnosis not present

## 2017-03-04 DIAGNOSIS — E119 Type 2 diabetes mellitus without complications: Secondary | ICD-10-CM | POA: Diagnosis not present

## 2017-03-04 DIAGNOSIS — I4821 Permanent atrial fibrillation: Secondary | ICD-10-CM

## 2017-03-04 DIAGNOSIS — I25118 Atherosclerotic heart disease of native coronary artery with other forms of angina pectoris: Secondary | ICD-10-CM

## 2017-03-04 DIAGNOSIS — E785 Hyperlipidemia, unspecified: Secondary | ICD-10-CM | POA: Diagnosis not present

## 2017-03-04 LAB — POCT INR: INR: 2.6

## 2017-03-04 MED ORDER — APIXABAN 5 MG PO TABS: 5.0000 mg | ORAL_TABLET | Freq: Two times a day (BID) | ORAL | 11 refills | Status: DC

## 2017-03-04 NOTE — Progress Notes (Signed)
Cardiology Office Note:    Date:  03/04/2017   ID:  Todd Sampson, DOB 1934/07/07, MRN [id number]  PCP:  Townsend Roger, MD  Cardiologist:  Jenean Lindau, MD   Referring MD: No ref. provider found    ASSESSMENT:    1. Coronary artery disease of native artery of native heart with stable angina pectoris ([place])   2. Essential hypertension   3. Permanent atrial fibrillation (Todd Sampson)   4. Prostate cancer (Todd Sampson)   5. Diet-controlled diabetes mellitus (Todd Sampson)   6. Dyslipidemia (high LDL; low HDL)    PLAN:    In order of problems listed above:  I discussed with the patient atrial fibrillation, disease process. Management and therapy including rate and rhythm control, anticoagulation benefits and potential risks were discussed extensively with the patient. Patient had multiple questions which were answered to patient's satisfaction. I also mentioned to the patient about new her anticoagulants and he is willing to try the new medication. We will check his pro time today. Benefits and potential risks were explained and he verbalized understanding.  Coronary artery disease is clinically stable and his blood pressure is also stable. Lipids are followed by his primary care physician.  Patient will be seen in follow-up appointment in 6 months or earlier if the patient has any concerns.   Medication Adjustments/Labs and Tests Ordered: Current medicines are reviewed at length with the patient today.  Concerns regarding medicines are outlined above.  No orders of the defined types were placed in this encounter.  No orders of the defined types were placed in this encounter.    History of Present Illness:    Todd Sampson is a 82 y.o. male who is being seen today for the evaluation of Atrial fibrillation and coronary artery disease. Patient has been evaluated by me in the previous practice. He wants to get established here. He has coronary artery disease and atrial fibrillation. He is on  anticoagulation with warfarin. He denies any problems at this time and takes care of activities of daily living. No chest pain orthopnea or PND. He ambulates age appropriately. My evaluation is alert awake oriented and in no distress.  Past Medical History:  Diagnosis Date  . Anemia   . Barrett's esophagus   . Diabetes mellitus   . Guillain-Barre disease (Todd Sampson)   . H/O: hemorrhoidectomy   . Heart disease   . Hyperlipidemia   . Hypertension   . Prostate cancer (Todd Sampson)   . Salivary gland calculi     Past Surgical History:  Procedure Laterality Date  . APPENDECTOMY    . CARDIAC BYPASS    . REPLACEMENT TOTAL KNEE BILATERAL      Current Medications: Current Meds  Medication Sig  . amLODipine (NORVASC) 10 MG tablet Take 10 mg by mouth daily.    . carvedilol (COREG) 3.125 MG tablet Take 1 tablet (3.125 mg total) by mouth 2 (two) times daily with a meal.  . finasteride (PROSCAR) 5 MG tablet Take 5 mg by mouth daily.  . folic acid (FOLVITE) 1 MG tablet Take 1 mg by mouth daily.    . isosorbide mononitrate (IMDUR) 60 MG 24 hr tablet Take 60 mg by mouth daily.   [name] lisinopril (PRINIVIL,ZESTRIL) 5 MG tablet Take 5 mg by mouth daily.  [name] lovastatin (MEVACOR) 20 MG tablet Take 20 mg by mouth daily.  . nitroGLYCERIN (NITROSTAT) 0.4 MG SL tablet Place 0.4 mg under the tongue daily.  . pantoprazole (PROTONIX) 40 MG tablet  Take 40 mg by mouth daily.  [name] thiamine 100 MG tablet Take 100 mg by mouth daily.  [name] warfarin (COUMADIN) 5 MG tablet Take 5 mg by mouth daily.  . [DISCONTINUED] lovastatin (MEVACOR) 20 MG tablet Take 20 mg by mouth daily.     Allergies:   Penicillins   Social History   Social History  . Marital status: Married    Spouse name: N/A  . Number of children: N/A  . Years of education: N/A   Social History Main Topics  . Smoking status: Former Research scientist (life sciences)  . Smokeless tobacco: Never Used  . Alcohol use No  . Drug use: No  . Sexual activity: Not Asked   Other Topics Concern  .  None   Social History Narrative  . None     Family History: The patient's family history includes Cancer in his brother and mother; Heart disease in his brother, father, and sister.  ROS:   Please see the history of present illness.    All other systems reviewed and are negative.  EKGs/Labs/Other Studies Reviewed:    The following studies were reviewed today: I reviewed previous records and discussed with the patient at extensive length. Questions were answered to his satisfaction.   Recent Labs: No results found for requested labs within last 8760 hours.  Recent Lipid Panel No results found for: CHOL, TRIG, HDL, CHOLHDL, VLDL, LDLCALC, LDLDIRECT  Physical Exam:    VS:  BP 130/62   Pulse (!) 51   Ht 5\' 9"  (1.753 m)   Wt 202 lb (91.6 kg)   SpO2 97%   BMI 29.83 kg/m     Wt Readings from Last 3 Encounters:  03/04/17 202 lb (91.6 kg)  05/14/11 229 lb (103.9 kg)     GEN: Patient is in no acute distress HEENT: Normal NECK: No JVD; No carotid bruits LYMPHATICS: No lymphadenopathy CARDIAC: S1 S2 irregular, 2/6 systolic murmur at the apex. RESPIRATORY:  Clear to auscultation without rales, wheezing or rhonchi  ABDOMEN: Soft, non-tender, non-distended MUSCULOSKELETAL:  No edema; No deformity  SKIN: Warm and dry NEUROLOGIC:  Alert and oriented x 3 PSYCHIATRIC:  Normal affect    Signed, Jenean Lindau, MD  03/04/2017 11:54 AM    [place]

## 2017-03-04 NOTE — Patient Instructions (Signed)
Medication Instructions:  STOP taking coumadin as of today.  START Eliquis when I have called you about your results.   Labwork: Fingerstick PT/INR in office today. PT/INR 2.6/30.8  Testing/Procedures: None   Follow-Up:  Your physician wants you to follow-up in: 6 months.  You will receive a reminder letter in the mail two months in advance. If you don't receive a letter, please call our office to schedule the follow-up appointment.  Any Other Special Instructions Will Be Listed Below (If Applicable).  Please note that any paperwork needing to be filled out by the provider will need to be addressed at the front desk prior to seeing the provider. Please note that any paperwork FMLA, Disability or other documents regarding health condition is subject to a $25.00 charge that must be received prior to completion of paperwork in the form of a money order or check.    If you need a refill on your cardiac medications before your next appointment, please call your pharmacy.

## 2017-03-04 NOTE — Addendum Note (Signed)
Addended by: Kathyrn Sheriff on: 03/04/2017 12:26 PM   Modules accepted: Orders

## 2017-03-04 NOTE — Addendum Note (Signed)
Addended by: Kathyrn Sheriff on: 03/04/2017 12:27 PM   Modules accepted: Orders

## 2017-03-04 NOTE — Addendum Note (Signed)
Addended by: Kathyrn Sheriff on: 03/04/2017 12:19 PM   Modules accepted: Orders

## 2017-03-06 DIAGNOSIS — Z7901 Long term (current) use of anticoagulants: Secondary | ICD-10-CM | POA: Diagnosis not present

## 2017-03-06 LAB — PROTIME-INR

## 2017-04-10 DIAGNOSIS — L728 Other follicular cysts of the skin and subcutaneous tissue: Secondary | ICD-10-CM | POA: Diagnosis not present

## 2017-04-10 DIAGNOSIS — L570 Actinic keratosis: Secondary | ICD-10-CM | POA: Diagnosis not present

## 2017-04-30 DIAGNOSIS — Z23 Encounter for immunization: Secondary | ICD-10-CM | POA: Diagnosis not present

## 2017-04-30 DIAGNOSIS — I480 Paroxysmal atrial fibrillation: Secondary | ICD-10-CM | POA: Diagnosis not present

## 2017-04-30 DIAGNOSIS — E119 Type 2 diabetes mellitus without complications: Secondary | ICD-10-CM | POA: Diagnosis not present

## 2017-04-30 DIAGNOSIS — E7800 Pure hypercholesterolemia, unspecified: Secondary | ICD-10-CM | POA: Diagnosis not present

## 2017-04-30 DIAGNOSIS — I10 Essential (primary) hypertension: Secondary | ICD-10-CM | POA: Diagnosis not present

## 2017-07-04 ENCOUNTER — Other Ambulatory Visit: Payer: Self-pay

## 2017-07-04 ENCOUNTER — Telehealth: Payer: Self-pay | Admitting: Cardiology

## 2017-07-04 MED ORDER — CARVEDILOL 3.125 MG PO TABS: 3.1250 mg | ORAL_TABLET | Freq: Two times a day (BID) | ORAL | 1 refills | Status: DC

## 2017-07-04 MED ORDER — APIXABAN 5 MG PO TABS: 5.0000 mg | ORAL_TABLET | Freq: Two times a day (BID) | ORAL | 1 refills | Status: DC

## 2017-07-04 NOTE — Telephone Encounter (Signed)
°*  STAT* If patient is at the pharmacy, call can be transferred to refill team.   1. Which medications need to be refilled? (please list name of each medication and dose if known) Eliquis 5mg  2 x daily  2. Which pharmacy/location (including street and city if local pharmacy) is medication to be sent to [facility] in [place]  3. Do they need a 30 day or 90 day supply? 30 with refills please    *STAT* If patient is at the pharmacy, call can be transferred to refill team.   1. Which medications need to be refilled? (please list name of each medication and dose if known) Carvedilol 3.143mh take 2 x daily  2. Which pharmacy/location (including street and city if local pharmacy) is medication to be sent to?[address]  3. Do they need a 30 day or 90 day supply? [place]

## 2017-07-04 NOTE — Telephone Encounter (Signed)
Med refills sent

## 2017-07-31 DIAGNOSIS — H6123 Impacted cerumen, bilateral: Secondary | ICD-10-CM | POA: Diagnosis not present

## 2017-07-31 DIAGNOSIS — Z6831 Body mass index (BMI) 31.0-31.9, adult: Secondary | ICD-10-CM | POA: Diagnosis not present

## 2017-07-31 DIAGNOSIS — E119 Type 2 diabetes mellitus without complications: Secondary | ICD-10-CM | POA: Diagnosis not present

## 2017-07-31 DIAGNOSIS — E559 Vitamin D deficiency, unspecified: Secondary | ICD-10-CM | POA: Diagnosis not present

## 2017-07-31 DIAGNOSIS — Z1389 Encounter for screening for other disorder: Secondary | ICD-10-CM | POA: Diagnosis not present

## 2017-07-31 DIAGNOSIS — E7800 Pure hypercholesterolemia, unspecified: Secondary | ICD-10-CM | POA: Diagnosis not present

## 2017-07-31 DIAGNOSIS — I2510 Atherosclerotic heart disease of native coronary artery without angina pectoris: Secondary | ICD-10-CM | POA: Diagnosis not present

## 2017-07-31 DIAGNOSIS — I4891 Unspecified atrial fibrillation: Secondary | ICD-10-CM | POA: Diagnosis not present

## 2017-07-31 DIAGNOSIS — Z8546 Personal history of malignant neoplasm of prostate: Secondary | ICD-10-CM | POA: Diagnosis not present

## 2017-07-31 DIAGNOSIS — K2270 Barrett's esophagus without dysplasia: Secondary | ICD-10-CM | POA: Diagnosis not present

## 2017-07-31 DIAGNOSIS — M150 Primary generalized (osteo)arthritis: Secondary | ICD-10-CM | POA: Diagnosis not present

## 2017-07-31 DIAGNOSIS — I10 Essential (primary) hypertension: Secondary | ICD-10-CM | POA: Diagnosis not present

## 2017-08-06 DIAGNOSIS — R351 Nocturia: Secondary | ICD-10-CM | POA: Diagnosis not present

## 2017-08-06 DIAGNOSIS — C61 Malignant neoplasm of prostate: Secondary | ICD-10-CM | POA: Diagnosis not present

## 2017-09-01 DIAGNOSIS — H6123 Impacted cerumen, bilateral: Secondary | ICD-10-CM | POA: Diagnosis not present

## 2017-10-02 ENCOUNTER — Other Ambulatory Visit: Payer: Self-pay | Admitting: Cardiology

## 2017-10-02 ENCOUNTER — Ambulatory Visit (INDEPENDENT_AMBULATORY_CARE_PROVIDER_SITE_OTHER): Payer: Medicare Other | Admitting: Cardiology

## 2017-10-02 ENCOUNTER — Encounter: Payer: Self-pay | Admitting: Cardiology

## 2017-10-02 VITALS — BP 146/82 | HR 60 | Ht 69.0 in | Wt 215.2 lb

## 2017-10-02 DIAGNOSIS — I25118 Atherosclerotic heart disease of native coronary artery with other forms of angina pectoris: Secondary | ICD-10-CM

## 2017-10-02 DIAGNOSIS — I482 Chronic atrial fibrillation: Secondary | ICD-10-CM | POA: Diagnosis not present

## 2017-10-02 DIAGNOSIS — E119 Type 2 diabetes mellitus without complications: Secondary | ICD-10-CM | POA: Diagnosis not present

## 2017-10-02 DIAGNOSIS — Z01810 Encounter for preprocedural cardiovascular examination: Secondary | ICD-10-CM | POA: Diagnosis not present

## 2017-10-02 DIAGNOSIS — Z951 Presence of aortocoronary bypass graft: Secondary | ICD-10-CM

## 2017-10-02 DIAGNOSIS — E785 Hyperlipidemia, unspecified: Secondary | ICD-10-CM | POA: Diagnosis not present

## 2017-10-02 DIAGNOSIS — I4821 Permanent atrial fibrillation: Secondary | ICD-10-CM

## 2017-10-02 DIAGNOSIS — I10 Essential (primary) hypertension: Secondary | ICD-10-CM

## 2017-10-02 HISTORY — DX: Encounter for preprocedural cardiovascular examination: Z01.810

## 2017-10-02 HISTORY — DX: Presence of aortocoronary bypass graft: Z95.1

## 2017-10-02 MED ORDER — NITROGLYCERIN 0.4 MG SL SUBL: 0.4000 mg | SUBLINGUAL_TABLET | SUBLINGUAL | 11 refills | Status: DC | PRN

## 2017-10-02 NOTE — Progress Notes (Signed)
Cardiology Office Note:    Date:  10/02/2017   ID:  Todd Sampson, DOB 1935-04-26, MRN [id number]  PCP:  Townsend Roger, MD  Cardiologist:  Jenean Lindau, MD   Referring MD: Townsend Roger, MD    ASSESSMENT:    1. Coronary artery disease of native artery of native heart with stable angina pectoris ([place])   2. Essential hypertension   3. Permanent atrial fibrillation ([place])   4. Diet-controlled diabetes mellitus ([place])   5. Dyslipidemia (high LDL; low HDL)    PLAN:    In order of problems listed above:  1. Secondary prevention stressed with patient.  Importance of compliance with diet and medications stressed and he vocalized understanding. 2. His blood pressure is stable. 3. Diet was discussed for dyslipidemia and diabetes mellitus.  Risks of obesity stressed and he vocalized understanding. 4. I discussed with the patient atrial fibrillation, disease process. Management and therapy including rate and rhythm control, anticoagulation benefits and potential risks were discussed extensively with the patient. Patient had multiple questions which were answered to patient's satisfaction. 5. The patient is not on aspirin because of anemia in the past. 6. For preop risk stratification I will do echocardiogram and Lexiscan sestamibi.  If this is negative he is not at high risk for coronary events during the aforementioned surgery.  Meticulous hemodynamic monitoring will further reduce the risk of coronary events.  This in association with continued perioperative beta-blockade of course. 7. I mentioned to him that we will need to change his statin to a more potent statin in the future when I see him in follow-up 8. As far as his anticoagulation is concerned it may be stopped 5 days before his surgery assuming that all his other tests above-mentioned have gone fine.   Medication Adjustments/Labs and Tests Ordered: Current medicines are reviewed at length with the patient today.  Concerns  regarding medicines are outlined above.  No orders of the defined types were placed in this encounter.  No orders of the defined types were placed in this encounter.    Chief Complaint  Patient presents with  . Follow-up  . Coronary Artery Disease     History of Present Illness:    Todd Sampson is a 83 y.o. male.  Patient has known coronary artery disease and has undergone coronary artery bypass surgery in the past.  He denies any problems at this time and takes care of activities of daily living.  No chest pain orthopnea or PND.  He is blood pressure stable.  He leads a sedentary lifestyle because of orthopedic issues involving his back.  He is also planning to undergo oral surgery by his dentist.  Again no chest pain orthopnea or PND.  He occasionally has chest tightness.  He has not used nitroglycerin and his nitroglycerin has expired.  Past Medical History:  Diagnosis Date  . Anemia   . Barrett's esophagus   . Diabetes mellitus   . Guillain-Barre disease ([place])   . H/O: hemorrhoidectomy   . Heart disease   . Hyperlipidemia   . Hypertension   . Prostate cancer ([place])   . Salivary gland calculi     Past Surgical History:  Procedure Laterality Date  . APPENDECTOMY    . CARDIAC BYPASS    . REPLACEMENT TOTAL KNEE BILATERAL      Current Medications: Current Meds  Medication Sig  . amLODipine (NORVASC) 10 MG tablet Take 10 mg by mouth daily.    [name]  apixaban (ELIQUIS) 5 MG TABS tablet Take 1 tablet (5 mg total) by mouth 2 (two) times daily.  . carvedilol (COREG) 3.125 MG tablet Take 1 tablet (3.125 mg total) by mouth 2 (two) times daily with a meal.  . finasteride (PROSCAR) 5 MG tablet Take 5 mg by mouth daily.  . folic acid (FOLVITE) 1 MG tablet Take 1 mg by mouth daily.    . isosorbide mononitrate (IMDUR) 60 MG 24 hr tablet Take 60 mg by mouth daily.   [name] lisinopril (PRINIVIL,ZESTRIL) 5 MG tablet Take 5 mg by mouth daily.  [name] lovastatin (MEVACOR) 20 MG tablet Take 20 mg by  mouth daily.  . pantoprazole (PROTONIX) 40 MG tablet Take 40 mg by mouth daily.  [name] thiamine 100 MG tablet Take 100 mg by mouth daily.     Allergies:   Penicillins   Social History   Socioeconomic History  . Marital status: Married    Spouse name: Not on file  . Number of children: Not on file  . Years of education: Not on file  . Highest education level: Not on file  Occupational History  . Not on file  Social Needs  . Financial resource strain: Not on file  . Food insecurity:    Worry: Not on file    Inability: Not on file  . Transportation needs:    Medical: Not on file    Non-medical: Not on file  Tobacco Use  . Smoking status: Former Research scientist (life sciences)  . Smokeless tobacco: Never Used  Substance and Sexual Activity  . Alcohol use: No  . Drug use: No  . Sexual activity: Not on file  Lifestyle  . Physical activity:    Days per week: Not on file    Minutes per session: Not on file  . Stress: Not on file  Relationships  . Social connections:    Talks on phone: Not on file    Gets together: Not on file    Attends religious service: Not on file    Active member of club or organization: Not on file    Attends meetings of clubs or organizations: Not on file    Relationship status: Not on file  Other Topics Concern  . Not on file  Social History Narrative  . Not on file     Family History: The patient's family history includes Cancer in his brother and mother; Heart disease in his brother, father, and sister.  ROS:   Please see the history of present illness.    All other systems reviewed and are negative.  EKGs/Labs/Other Studies Reviewed:    The following studies were reviewed today: I reviewed EKG which reveals atrial fibrillation and well-controlled ventricular rate and nonspecific ST-T changes.   Recent Labs: No results found for requested labs within last 8760 hours.  Recent Lipid Panel No results found for: CHOL, TRIG, HDL, CHOLHDL, VLDL, LDLCALC,  LDLDIRECT  Physical Exam:    VS:  BP (!) 146/82 (BP Location: Right Arm, Patient Position: Sitting, Cuff Size: Normal)   Pulse 60   Ht 5\' 9"  (1.753 m)   Wt 215 lb 3.2 oz (97.6 kg)   SpO2 98%   BMI 31.78 kg/m     Wt Readings from Last 3 Encounters:  10/02/17 215 lb 3.2 oz (97.6 kg)  03/04/17 202 lb (91.6 kg)  05/14/11 229 lb (103.9 kg)     GEN: Patient is in no acute distress HEENT: Normal NECK: No JVD; No carotid bruits LYMPHATICS: No  lymphadenopathy CARDIAC: Hear sounds regular, 2/6 systolic murmur at the apex. RESPIRATORY:  Clear to auscultation without rales, wheezing or rhonchi  ABDOMEN: Soft, non-tender, non-distended MUSCULOSKELETAL:  No edema; No deformity  SKIN: Warm and dry NEUROLOGIC:  Alert and oriented x 3 PSYCHIATRIC:  Normal affect   Signed, Jenean Lindau, MD  10/02/2017 4:12 PM    [facility]

## 2017-10-02 NOTE — Patient Instructions (Signed)
Medication Instructions:  Your physician recommends that you continue on your current medications as directed. Please refer to the Current Medication list given to you today.  Refill for Nitroglycerin 0.4 mg sublingual (under your tongue) as needed for chest pain. If experiencing chest pain, stop what you are doing and sit down. Take 1 nitroglycerin and wait 5 minutes. If chest pain continues, take another nitroglycerin and wait 5 minutes. If chest pain does not subside, take 1 more nitroglycerin and dial 911. You make take a total of 3 nitroglycerin in a 15 minute time frame.  Labwork: None  Testing/Procedures: Your physician has requested that you have an echocardiogram. Echocardiography is a painless test that uses sound waves to create images of your heart. It provides your doctor with information about the size and shape of your heart and how well your heart's chambers and valves are working. This procedure takes approximately one hour. There are no restrictions for this procedure.  Your physician has requested that you have a lexiscan myoview. For further information please visit [email or web address]. Please follow instruction sheet, as given.  Follow-Up: Your physician recommends that you schedule a follow-up appointment in: 6 months  Any Other Special Instructions Will Be Listed Below (If Applicable).     If you need a refill on your cardiac medications before your next appointment, please call your pharmacy.   [place], RN, BSN

## 2017-10-06 ENCOUNTER — Telehealth: Payer: Self-pay | Admitting: Cardiology

## 2017-10-06 ENCOUNTER — Other Ambulatory Visit: Payer: Self-pay

## 2017-10-06 MED ORDER — LOVASTATIN 20 MG PO TABS: 20.0000 mg | ORAL_TABLET | Freq: Every day | ORAL | 3 refills | Status: DC

## 2017-10-06 NOTE — Telephone Encounter (Signed)
Med refill sent

## 2017-10-06 NOTE — Telephone Encounter (Signed)
Please call generic cholesterol med into [facility] on [address] in [place]

## 2017-10-09 ENCOUNTER — Telehealth (HOSPITAL_COMMUNITY): Payer: Self-pay | Admitting: *Deleted

## 2017-10-09 NOTE — Telephone Encounter (Signed)
Patient given detailed instructions per Myocardial Perfusion Study Information Sheet for the test on 10/14/17. Patient notified to arrive 15 minutes early and that it is imperative to arrive on time for appointment to keep from having the test rescheduled.  If you need to cancel or reschedule your appointment, please call the office within 24 hours of your appointment. . Patient verbalized understanding. Kirstie Peri

## 2017-10-10 ENCOUNTER — Other Ambulatory Visit (HOSPITAL_COMMUNITY): Payer: Medicare Other

## 2017-10-14 ENCOUNTER — Ambulatory Visit (HOSPITAL_COMMUNITY): Payer: Medicare Other | Attending: Cardiology

## 2017-10-14 ENCOUNTER — Ambulatory Visit (HOSPITAL_BASED_OUTPATIENT_CLINIC_OR_DEPARTMENT_OTHER): Payer: Medicare Other

## 2017-10-14 VITALS — Ht 69.0 in | Wt 215.0 lb

## 2017-10-14 DIAGNOSIS — I10 Essential (primary) hypertension: Secondary | ICD-10-CM | POA: Insufficient documentation

## 2017-10-14 DIAGNOSIS — Z951 Presence of aortocoronary bypass graft: Secondary | ICD-10-CM | POA: Diagnosis not present

## 2017-10-14 DIAGNOSIS — E119 Type 2 diabetes mellitus without complications: Secondary | ICD-10-CM | POA: Diagnosis not present

## 2017-10-14 DIAGNOSIS — E785 Hyperlipidemia, unspecified: Secondary | ICD-10-CM | POA: Diagnosis not present

## 2017-10-14 DIAGNOSIS — I482 Chronic atrial fibrillation: Secondary | ICD-10-CM | POA: Diagnosis not present

## 2017-10-14 DIAGNOSIS — I083 Combined rheumatic disorders of mitral, aortic and tricuspid valves: Secondary | ICD-10-CM | POA: Insufficient documentation

## 2017-10-14 DIAGNOSIS — I25118 Atherosclerotic heart disease of native coronary artery with other forms of angina pectoris: Secondary | ICD-10-CM | POA: Diagnosis not present

## 2017-10-14 DIAGNOSIS — G610 Guillain-Barre syndrome: Secondary | ICD-10-CM | POA: Insufficient documentation

## 2017-10-14 LAB — MYOCARDIAL PERFUSION IMAGING
LVDIAVOL: 157 mL (ref 62–150)
LVSYSVOL: 82 mL
Peak HR: 56 {beats}/min
RATE: 0.4
Rest HR: 53 {beats}/min
SDS: 0
SRS: 2
SSS: 2
TID: 1.02

## 2017-10-14 IMAGING — NM NM MYOCAR PERF WALL MOTION
5 series · 30 of 30 positions shown · non-contrast
Comparison: none

[Series 1: wbr_r-proj_st rest · 6.51mm/px · 6 of 64 frames shown]
[frame 6/64]
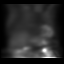
[frame 16/64]
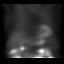
[frame 27/64]
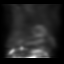
[frame 38/64]
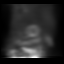
[frame 48/64]
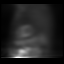
[frame 59/64]
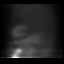

[Series 1: rest · 6.51mm/px · 6 of 64 frames shown]
[frame 6/64]
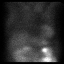
[frame 16/64]
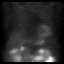
[frame 27/64]
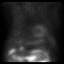
[frame 38/64]
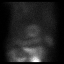
[frame 48/64]
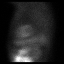
[frame 59/64]
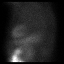

[Series 2: stress - gated · 6.51mm/px · 6 of 512 frames shown]
[frame 43/512]
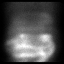
[frame 128/512]
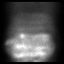
[frame 214/512]
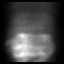
[frame 299/512]
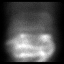
[frame 384/512]
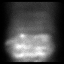
[frame 470/512]
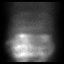

[Series 2: wbr_s-proj_st stress - gated · 6.51mm/px · 6 of 512 frames shown]
[frame 43/512]
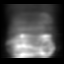
[frame 128/512]
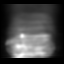
[frame 214/512]
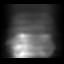
[frame 299/512]
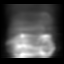
[frame 384/512]
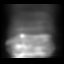
[frame 470/512]
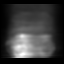

[Series 3: stress - perfusion · 6.51mm/px · 6 of 64 frames shown]
[frame 6/64]
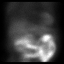
[frame 16/64]
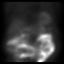
[frame 27/64]
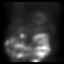
[frame 38/64]
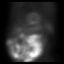
[frame 48/64]
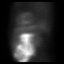
[frame 59/64]
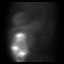

[30 of 30 positions shown; findings below may reference images not displayed]

Canned report from images found in remote index.

Refer to host system for actual result text.

## 2017-10-14 MED ORDER — TECHNETIUM TC 99M TETROFOSMIN IV KIT
32.0000 | PACK | Freq: Once | INTRAVENOUS | Status: AC | PRN
  Administered 2017-10-14: 32 via INTRAVENOUS
  Filled 2017-10-14: qty 32

## 2017-10-14 MED ORDER — TECHNETIUM TC 99M TETROFOSMIN IV KIT
10.6000 | PACK | Freq: Once | INTRAVENOUS | Status: AC | PRN
  Administered 2017-10-14: 10.6 via INTRAVENOUS
  Filled 2017-10-14: qty 11

## 2017-10-14 MED ORDER — REGADENOSON 0.4 MG/5ML IV SOLN
0.4000 mg | Freq: Once | INTRAVENOUS | Status: AC
  Administered 2017-10-14: 0.4 mg via INTRAVENOUS

## 2017-10-16 ENCOUNTER — Other Ambulatory Visit (HOSPITAL_COMMUNITY): Payer: Medicare Other

## 2017-10-29 DIAGNOSIS — M810 Age-related osteoporosis without current pathological fracture: Secondary | ICD-10-CM | POA: Diagnosis not present

## 2017-10-29 DIAGNOSIS — E119 Type 2 diabetes mellitus without complications: Secondary | ICD-10-CM | POA: Diagnosis not present

## 2017-10-29 DIAGNOSIS — I10 Essential (primary) hypertension: Secondary | ICD-10-CM | POA: Diagnosis not present

## 2017-11-03 DIAGNOSIS — T161XXA Foreign body in right ear, initial encounter: Secondary | ICD-10-CM | POA: Diagnosis not present

## 2017-11-13 DIAGNOSIS — M810 Age-related osteoporosis without current pathological fracture: Secondary | ICD-10-CM | POA: Diagnosis not present

## 2017-12-01 DIAGNOSIS — M1612 Unilateral primary osteoarthritis, left hip: Secondary | ICD-10-CM | POA: Diagnosis not present

## 2017-12-16 DIAGNOSIS — M1612 Unilateral primary osteoarthritis, left hip: Secondary | ICD-10-CM | POA: Diagnosis not present

## 2018-01-08 ENCOUNTER — Other Ambulatory Visit: Payer: Self-pay | Admitting: *Deleted

## 2018-01-08 MED ORDER — APIXABAN 5 MG PO TABS: 5.0000 mg | ORAL_TABLET | Freq: Two times a day (BID) | ORAL | 2 refills | Status: DC

## 2018-01-30 DIAGNOSIS — I10 Essential (primary) hypertension: Secondary | ICD-10-CM | POA: Diagnosis not present

## 2018-02-04 DIAGNOSIS — C61 Malignant neoplasm of prostate: Secondary | ICD-10-CM | POA: Diagnosis not present

## 2018-03-12 DIAGNOSIS — I10 Essential (primary) hypertension: Secondary | ICD-10-CM | POA: Diagnosis not present

## 2018-03-12 DIAGNOSIS — M150 Primary generalized (osteo)arthritis: Secondary | ICD-10-CM | POA: Diagnosis not present

## 2018-03-12 DIAGNOSIS — Z23 Encounter for immunization: Secondary | ICD-10-CM | POA: Diagnosis not present

## 2018-03-24 ENCOUNTER — Ambulatory Visit (INDEPENDENT_AMBULATORY_CARE_PROVIDER_SITE_OTHER): Payer: Medicare Other | Admitting: Cardiology

## 2018-03-24 ENCOUNTER — Encounter: Payer: Self-pay | Admitting: Cardiology

## 2018-03-24 VITALS — BP 152/76 | HR 55 | Ht 69.0 in | Wt 209.0 lb

## 2018-03-24 DIAGNOSIS — E785 Hyperlipidemia, unspecified: Secondary | ICD-10-CM | POA: Diagnosis not present

## 2018-03-24 DIAGNOSIS — Z01810 Encounter for preprocedural cardiovascular examination: Secondary | ICD-10-CM | POA: Diagnosis not present

## 2018-03-24 DIAGNOSIS — I10 Essential (primary) hypertension: Secondary | ICD-10-CM | POA: Diagnosis not present

## 2018-03-24 DIAGNOSIS — E119 Type 2 diabetes mellitus without complications: Secondary | ICD-10-CM | POA: Diagnosis not present

## 2018-03-24 DIAGNOSIS — Z951 Presence of aortocoronary bypass graft: Secondary | ICD-10-CM

## 2018-03-24 DIAGNOSIS — I482 Chronic atrial fibrillation: Secondary | ICD-10-CM

## 2018-03-24 DIAGNOSIS — I25118 Atherosclerotic heart disease of native coronary artery with other forms of angina pectoris: Secondary | ICD-10-CM | POA: Diagnosis not present

## 2018-03-24 DIAGNOSIS — I4821 Permanent atrial fibrillation: Secondary | ICD-10-CM

## 2018-03-24 NOTE — Progress Notes (Signed)
Cardiology Office Note:    Date:  03/24/2018   ID:  Todd Sampson, DOB 11/29/1934, MRN [id number]  PCP:  Townsend Roger, MD  Cardiologist:  Jenean Lindau, MD   Referring MD: Townsend Roger, MD    ASSESSMENT:    1. Pre-operative cardiovascular examination   2. Coronary artery disease of native artery of native heart with stable angina pectoris ([place])   3. Essential hypertension   4. Permanent atrial fibrillation ([place])   5. Diet-controlled diabetes mellitus ([place])   6. Dyslipidemia (high LDL; low HDL)   7. Hx of CABG    PLAN:    In order of problems listed above:  1. Patient is doing well.  Secondary prevention stressed.  Importance of compliance with diet and medication stressed and he vocalized understanding.  To the best of his ability he ambulates with a cane and with this he does not have any chest pain chest tightness or any concerning symptoms. 2. In view of the above facts and the fact that he had a negative stress test earlier during the refill is not at high risk for coronary events during the aforementioned surgery.  Meticulous hemodynamic monitoring and continued beta-blockade will further reduce the risk of coronary events. 3. The patient's ACE inhibitor has been increased by primary care physician but he has not complied and he will do so from now on and the blood pressure issues will be followed by his primary care physician.  His lisinopril has been increased to 10 mg and he will follow-up for his blood pressure with his primary care doctor.  This needs to be optimized better. 4. Anticoagulation can be withheld for the needs of the surgery and resumed as soon as possible.  Patient understands the risk of this is not keen on bridging.  Benefits and potential risks explained. 5. Patient will be seen in follow-up appointment in 6 months or earlier if the patient has any concerns    Medication Adjustments/Labs and Tests Ordered: Current medicines are reviewed at length  with the patient today.  Concerns regarding medicines are outlined above.  No orders of the defined types were placed in this encounter.  No orders of the defined types were placed in this encounter.    No chief complaint on file.    History of Present Illness:    Todd Sampson is a 83 y.o. male.  Patient has chronic atrial fibrillation.  He has known coronary artery disease.  He has essential hypertension and dyslipidemia.  He has significant issues with arthritis of his left hip and is contemplating surgery.  He denies any chest pain orthopnea or PND.  The patient mentions to me that earlier this year he had an echocardiogram and stress test which were unremarkable and I reviewed them.  At the time of my evaluation, the patient is alert awake oriented and in no distress.  Past Medical History:  Diagnosis Date  . Anemia   . Barrett's esophagus   . Diabetes mellitus   . Guillain-Barre disease ([place])   . H/O: hemorrhoidectomy   . Heart disease   . Hyperlipidemia   . Hypertension   . Prostate cancer ([place])   . Salivary gland calculi     Past Surgical History:  Procedure Laterality Date  . APPENDECTOMY    . CARDIAC BYPASS    . REPLACEMENT TOTAL KNEE BILATERAL      Current Medications: Current Meds  Medication Sig  . amLODipine (NORVASC) 10 MG  tablet Take 10 mg by mouth daily.    [name] apixaban (ELIQUIS) 5 MG TABS tablet Take 1 tablet (5 mg total) by mouth 2 (two) times daily.  . carvedilol (COREG) 3.125 MG tablet Take 1 tablet (3.125 mg total) by mouth 2 (two) times daily with a meal.  . finasteride (PROSCAR) 5 MG tablet Take 10 mg by mouth daily.   . folic acid (FOLVITE) 1 MG tablet Take 1 mg by mouth daily.    . isosorbide mononitrate (IMDUR) 60 MG 24 hr tablet Take 60 mg by mouth daily.   [name] lisinopril (PRINIVIL,ZESTRIL) 5 MG tablet Take 5 mg by mouth daily.  [name] lovastatin (MEVACOR) 20 MG tablet Take 1 tablet (20 mg total) by mouth daily at 6 PM.  . nitroGLYCERIN (NITROSTAT) 0.4  MG SL tablet Place 1 tablet (0.4 mg total) under the tongue every 5 (five) minutes as needed for chest pain.  . pantoprazole (PROTONIX) 40 MG tablet Take 40 mg by mouth daily.  [name] thiamine 100 MG tablet Take 100 mg by mouth daily.     Allergies:   Penicillins   Social History   Socioeconomic History  . Marital status: Married    Spouse name: Not on file  . Number of children: Not on file  . Years of education: Not on file  . Highest education level: Not on file  Occupational History  . Not on file  Social Needs  . Financial resource strain: Not on file  . Food insecurity:    Worry: Not on file    Inability: Not on file  . Transportation needs:    Medical: Not on file    Non-medical: Not on file  Tobacco Use  . Smoking status: Former Research scientist (life sciences)  . Smokeless tobacco: Never Used  Substance and Sexual Activity  . Alcohol use: No  . Drug use: No  . Sexual activity: Not on file  Lifestyle  . Physical activity:    Days per week: Not on file    Minutes per session: Not on file  . Stress: Not on file  Relationships  . Social connections:    Talks on phone: Not on file    Gets together: Not on file    Attends religious service: Not on file    Active member of club or organization: Not on file    Attends meetings of clubs or organizations: Not on file    Relationship status: Not on file  Other Topics Concern  . Not on file  Social History Narrative  . Not on file     Family History: The patient's family history includes Cancer in his brother and mother; Heart disease in his brother, father, and sister.  ROS:   Please see the history of present illness.    All other systems reviewed and are negative.  EKGs/Labs/Other Studies Reviewed:    The following studies were reviewed today: Discussed my findings with the patient including echo and stress test done in April.   Recent Labs: No results found for requested labs within last 8760 hours.  Recent Lipid Panel No results  found for: CHOL, TRIG, HDL, CHOLHDL, VLDL, LDLCALC, LDLDIRECT  Physical Exam:    VS:  BP (!) 152/76 (BP Location: Right Arm, Patient Position: Sitting, Cuff Size: Normal)   Pulse (!) 55   Ht 5\' 9"  (1.753 m)   Wt 209 lb (94.8 kg)   SpO2 99%   BMI 30.86 kg/m     Wt Readings from Last 3  Encounters:  03/24/18 209 lb (94.8 kg)  10/14/17 215 lb (97.5 kg)  10/02/17 215 lb 3.2 oz (97.6 kg)     GEN: Patient is in no acute distress HEENT: Normal NECK: No JVD; No carotid bruits LYMPHATICS: No lymphadenopathy CARDIAC: Hear sounds regular, 2/6 systolic murmur at the apex. RESPIRATORY:  Clear to auscultation without rales, wheezing or rhonchi  ABDOMEN: Soft, non-tender, non-distended MUSCULOSKELETAL:  No edema; No deformity  SKIN: Warm and dry NEUROLOGIC:  Alert and oriented x 3 PSYCHIATRIC:  Normal affect   Signed, Jenean Lindau, MD  03/24/2018 2:50 PM    [facility]

## 2018-03-24 NOTE — Patient Instructions (Signed)

## 2018-04-14 DIAGNOSIS — L570 Actinic keratosis: Secondary | ICD-10-CM | POA: Diagnosis not present

## 2018-04-20 DIAGNOSIS — M1612 Unilateral primary osteoarthritis, left hip: Secondary | ICD-10-CM

## 2018-04-20 HISTORY — DX: Unilateral primary osteoarthritis, left hip: M16.12

## 2018-04-30 DIAGNOSIS — E119 Type 2 diabetes mellitus without complications: Secondary | ICD-10-CM | POA: Diagnosis not present

## 2018-04-30 DIAGNOSIS — I10 Essential (primary) hypertension: Secondary | ICD-10-CM | POA: Diagnosis not present

## 2018-05-05 DIAGNOSIS — M79609 Pain in unspecified limb: Secondary | ICD-10-CM | POA: Diagnosis not present

## 2018-05-05 DIAGNOSIS — Z0389 Encounter for observation for other suspected diseases and conditions ruled out: Secondary | ICD-10-CM | POA: Diagnosis not present

## 2018-05-05 DIAGNOSIS — I4891 Unspecified atrial fibrillation: Secondary | ICD-10-CM | POA: Diagnosis not present

## 2018-05-05 DIAGNOSIS — E119 Type 2 diabetes mellitus without complications: Secondary | ICD-10-CM | POA: Diagnosis not present

## 2018-05-05 DIAGNOSIS — M25559 Pain in unspecified hip: Secondary | ICD-10-CM | POA: Diagnosis not present

## 2018-05-05 DIAGNOSIS — Z01818 Encounter for other preprocedural examination: Secondary | ICD-10-CM | POA: Diagnosis not present

## 2018-05-05 DIAGNOSIS — Z22322 Carrier or suspected carrier of Methicillin resistant Staphylococcus aureus: Secondary | ICD-10-CM | POA: Diagnosis not present

## 2018-05-05 DIAGNOSIS — M25552 Pain in left hip: Secondary | ICD-10-CM | POA: Diagnosis not present

## 2018-05-05 DIAGNOSIS — M87052 Idiopathic aseptic necrosis of left femur: Secondary | ICD-10-CM | POA: Diagnosis not present

## 2018-05-05 DIAGNOSIS — M1612 Unilateral primary osteoarthritis, left hip: Secondary | ICD-10-CM | POA: Diagnosis not present

## 2018-05-05 DIAGNOSIS — M1611 Unilateral primary osteoarthritis, right hip: Secondary | ICD-10-CM | POA: Diagnosis not present

## 2018-05-25 DIAGNOSIS — Z9889 Other specified postprocedural states: Secondary | ICD-10-CM | POA: Diagnosis not present

## 2018-05-25 DIAGNOSIS — Z7901 Long term (current) use of anticoagulants: Secondary | ICD-10-CM | POA: Diagnosis not present

## 2018-05-25 DIAGNOSIS — Z87891 Personal history of nicotine dependence: Secondary | ICD-10-CM | POA: Diagnosis not present

## 2018-05-25 DIAGNOSIS — Z951 Presence of aortocoronary bypass graft: Secondary | ICD-10-CM | POA: Diagnosis not present

## 2018-05-25 DIAGNOSIS — Z6830 Body mass index (BMI) 30.0-30.9, adult: Secondary | ICD-10-CM | POA: Diagnosis not present

## 2018-05-25 DIAGNOSIS — I2510 Atherosclerotic heart disease of native coronary artery without angina pectoris: Secondary | ICD-10-CM | POA: Diagnosis present

## 2018-05-25 DIAGNOSIS — K219 Gastro-esophageal reflux disease without esophagitis: Secondary | ICD-10-CM | POA: Diagnosis present

## 2018-05-25 DIAGNOSIS — Z96653 Presence of artificial knee joint, bilateral: Secondary | ICD-10-CM | POA: Diagnosis present

## 2018-05-25 DIAGNOSIS — E669 Obesity, unspecified: Secondary | ICD-10-CM | POA: Diagnosis present

## 2018-05-25 DIAGNOSIS — I4891 Unspecified atrial fibrillation: Secondary | ICD-10-CM | POA: Diagnosis present

## 2018-05-25 DIAGNOSIS — E119 Type 2 diabetes mellitus without complications: Secondary | ICD-10-CM | POA: Diagnosis present

## 2018-05-25 DIAGNOSIS — Z471 Aftercare following joint replacement surgery: Secondary | ICD-10-CM | POA: Diagnosis not present

## 2018-05-25 DIAGNOSIS — I10 Essential (primary) hypertension: Secondary | ICD-10-CM | POA: Diagnosis present

## 2018-05-25 DIAGNOSIS — Z8546 Personal history of malignant neoplasm of prostate: Secondary | ICD-10-CM | POA: Diagnosis not present

## 2018-05-25 DIAGNOSIS — M1612 Unilateral primary osteoarthritis, left hip: Secondary | ICD-10-CM | POA: Diagnosis not present

## 2018-05-25 DIAGNOSIS — Z96642 Presence of left artificial hip joint: Secondary | ICD-10-CM | POA: Diagnosis not present

## 2018-05-25 DIAGNOSIS — N400 Enlarged prostate without lower urinary tract symptoms: Secondary | ICD-10-CM | POA: Diagnosis present

## 2018-05-25 DIAGNOSIS — E785 Hyperlipidemia, unspecified: Secondary | ICD-10-CM | POA: Diagnosis present

## 2018-06-04 DIAGNOSIS — Z96642 Presence of left artificial hip joint: Secondary | ICD-10-CM | POA: Diagnosis not present

## 2018-06-04 DIAGNOSIS — Z471 Aftercare following joint replacement surgery: Secondary | ICD-10-CM | POA: Diagnosis not present

## 2018-07-23 DIAGNOSIS — Z96642 Presence of left artificial hip joint: Secondary | ICD-10-CM | POA: Diagnosis not present

## 2018-07-23 DIAGNOSIS — Z471 Aftercare following joint replacement surgery: Secondary | ICD-10-CM | POA: Diagnosis not present

## 2018-09-10 DIAGNOSIS — M1990 Unspecified osteoarthritis, unspecified site: Secondary | ICD-10-CM | POA: Diagnosis not present

## 2018-09-10 DIAGNOSIS — E119 Type 2 diabetes mellitus without complications: Secondary | ICD-10-CM | POA: Diagnosis not present

## 2018-09-10 DIAGNOSIS — I10 Essential (primary) hypertension: Secondary | ICD-10-CM | POA: Diagnosis not present

## 2018-09-24 DIAGNOSIS — N401 Enlarged prostate with lower urinary tract symptoms: Secondary | ICD-10-CM | POA: Diagnosis not present

## 2018-09-24 DIAGNOSIS — C61 Malignant neoplasm of prostate: Secondary | ICD-10-CM | POA: Diagnosis not present

## 2018-09-28 ENCOUNTER — Ambulatory Visit: Payer: Medicare Other | Admitting: Cardiology

## 2018-10-04 ENCOUNTER — Other Ambulatory Visit: Payer: Self-pay | Admitting: Cardiology

## 2018-10-07 DIAGNOSIS — J302 Other seasonal allergic rhinitis: Secondary | ICD-10-CM | POA: Diagnosis not present

## 2018-10-07 DIAGNOSIS — J329 Chronic sinusitis, unspecified: Secondary | ICD-10-CM | POA: Diagnosis not present

## 2018-10-12 DIAGNOSIS — H9193 Unspecified hearing loss, bilateral: Secondary | ICD-10-CM | POA: Diagnosis not present

## 2018-10-12 DIAGNOSIS — H6123 Impacted cerumen, bilateral: Secondary | ICD-10-CM | POA: Diagnosis not present

## 2018-10-12 DIAGNOSIS — H9202 Otalgia, left ear: Secondary | ICD-10-CM | POA: Diagnosis not present

## 2018-11-17 ENCOUNTER — Ambulatory Visit: Payer: Medicare Other | Admitting: Cardiology

## 2018-12-16 ENCOUNTER — Other Ambulatory Visit: Payer: Self-pay | Admitting: Cardiology

## 2019-01-12 ENCOUNTER — Ambulatory Visit: Payer: Medicare Other | Admitting: Cardiology

## 2019-01-20 ENCOUNTER — Other Ambulatory Visit: Payer: Self-pay

## 2019-01-20 ENCOUNTER — Ambulatory Visit (INDEPENDENT_AMBULATORY_CARE_PROVIDER_SITE_OTHER): Payer: Medicare Other | Admitting: Cardiology

## 2019-01-20 ENCOUNTER — Encounter: Payer: Self-pay | Admitting: Cardiology

## 2019-01-20 VITALS — BP 128/82 | HR 91 | Ht 69.0 in | Wt 205.0 lb

## 2019-01-20 DIAGNOSIS — M8949 Other hypertrophic osteoarthropathy, multiple sites: Secondary | ICD-10-CM | POA: Diagnosis not present

## 2019-01-20 DIAGNOSIS — S32020D Wedge compression fracture of second lumbar vertebra, subsequent encounter for fracture with routine healing: Secondary | ICD-10-CM | POA: Diagnosis not present

## 2019-01-20 DIAGNOSIS — I25118 Atherosclerotic heart disease of native coronary artery with other forms of angina pectoris: Secondary | ICD-10-CM

## 2019-01-20 DIAGNOSIS — Z1389 Encounter for screening for other disorder: Secondary | ICD-10-CM | POA: Diagnosis not present

## 2019-01-20 DIAGNOSIS — I2510 Atherosclerotic heart disease of native coronary artery without angina pectoris: Secondary | ICD-10-CM | POA: Diagnosis not present

## 2019-01-20 DIAGNOSIS — I10 Essential (primary) hypertension: Secondary | ICD-10-CM | POA: Diagnosis not present

## 2019-01-20 DIAGNOSIS — K2270 Barrett's esophagus without dysplasia: Secondary | ICD-10-CM | POA: Diagnosis not present

## 2019-01-20 DIAGNOSIS — E785 Hyperlipidemia, unspecified: Secondary | ICD-10-CM

## 2019-01-20 DIAGNOSIS — M810 Age-related osteoporosis without current pathological fracture: Secondary | ICD-10-CM | POA: Diagnosis not present

## 2019-01-20 DIAGNOSIS — I4891 Unspecified atrial fibrillation: Secondary | ICD-10-CM | POA: Diagnosis not present

## 2019-01-20 DIAGNOSIS — J4520 Mild intermittent asthma, uncomplicated: Secondary | ICD-10-CM | POA: Diagnosis not present

## 2019-01-20 DIAGNOSIS — I4821 Permanent atrial fibrillation: Secondary | ICD-10-CM | POA: Diagnosis not present

## 2019-01-20 DIAGNOSIS — E1121 Type 2 diabetes mellitus with diabetic nephropathy: Secondary | ICD-10-CM | POA: Diagnosis not present

## 2019-01-20 DIAGNOSIS — E119 Type 2 diabetes mellitus without complications: Secondary | ICD-10-CM | POA: Diagnosis not present

## 2019-01-20 DIAGNOSIS — Z20828 Contact with and (suspected) exposure to other viral communicable diseases: Secondary | ICD-10-CM | POA: Diagnosis not present

## 2019-01-20 NOTE — Progress Notes (Signed)
Cardiology Office Note:    Date:  01/20/2019   ID:  Todd Sampson, DOB [birth year], MRN [id number]  PCP:  Townsend Roger, MD  Cardiologist:  Jenean Lindau, MD   Referring MD: Townsend Roger, MD    ASSESSMENT:    1. Permanent atrial fibrillation   2. Essential hypertension   3. Coronary artery disease of native artery of native heart with stable angina pectoris ([place])   4. Diet-controlled diabetes mellitus ([place])   5. Dyslipidemia (high LDL; low HDL)    PLAN:    In order of problems listed above:  1. Atrial fibrillation:I discussed with the patient atrial fibrillation, disease process. Management and therapy including rate and rhythm control, anticoagulation benefits and potential risks were discussed extensively with the patient. Patient had multiple questions which were answered to patient's satisfaction. 2. Coronary artery disease: Stable at this time and asymptomatic.  Secondary prevention stressed.  Importance of compliance with diet and medication stressed he had all blood work done this morning and we will get a copy from his primary care physician when available.. Sublingual nitroglycerin prescription was sent, its protocol and 911 protocol explained and the patient vocalized understanding questions were answered to the patient's satisfaction 3. Diet was discussed for dyslipidemia and he is swallowing it meticulously. 4. Patient will be seen in follow-up appointment in 6 months or earlier if the patient has any concerns    Medication Adjustments/Labs and Tests Ordered: Current medicines are reviewed at length with the patient today.  Concerns regarding medicines are outlined above.  No orders of the defined types were placed in this encounter.  No orders of the defined types were placed in this encounter.    No chief complaint on file.    History of Present Illness:    Todd Sampson is a 84 y.o. male.  Patient has past medical history of coronary artery disease,  essential hypertension, permanent atrial fibrillation and dyslipidemia.  He denies any problems at this time and takes care of activities of daily living.  No chest pain orthopnea or PND.  At the time of my evaluation, the patient is alert awake oriented and in no distress.  Past Medical History:  Diagnosis Date  . Anemia   . Barrett's esophagus   . Diabetes mellitus   . Guillain-Barre disease ([place])   . H/O: hemorrhoidectomy   . Heart disease   . Hyperlipidemia   . Hypertension   . Prostate cancer ([place])   . Salivary gland calculi     Past Surgical History:  Procedure Laterality Date  . APPENDECTOMY    . CARDIAC BYPASS    . REPLACEMENT TOTAL KNEE BILATERAL      Current Medications: Current Meds  Medication Sig  . amLODipine (NORVASC) 10 MG tablet Take 10 mg by mouth daily.    . carvedilol (COREG) 3.125 MG tablet Take 1 tablet (3.125 mg total) by mouth 2 (two) times daily with a meal.  . ELIQUIS 5 MG TABS tablet TAKE 1 TABLET BY MOUTH TWICE A DAY  . finasteride (PROSCAR) 5 MG tablet Take 10 mg by mouth daily.   . folic acid (FOLVITE) 1 MG tablet Take 1 mg by mouth daily.    . isosorbide mononitrate (IMDUR) 60 MG 24 hr tablet Take 60 mg by mouth daily.   [name] lisinopril (PRINIVIL,ZESTRIL) 5 MG tablet Take 5 mg by mouth daily.  [name] lovastatin (MEVACOR) 20 MG tablet Take 1 tablet (20 mg total) by mouth daily at  6 PM.  . pantoprazole (PROTONIX) 40 MG tablet Take 40 mg by mouth daily.  [name] thiamine 100 MG tablet Take 100 mg by mouth daily.     Allergies:   Penicillins   Social History   Socioeconomic History  . Marital status: Married    Spouse name: Not on file  . Number of children: Not on file  . Years of education: Not on file  . Highest education level: Not on file  Occupational History  . Not on file  Social Needs  . Financial resource strain: Not on file  . Food insecurity    Worry: Not on file    Inability: Not on file  . Transportation needs    Medical: Not on file     Non-medical: Not on file  Tobacco Use  . Smoking status: Former Research scientist (life sciences)  . Smokeless tobacco: Never Used  Substance and Sexual Activity  . Alcohol use: No  . Drug use: No  . Sexual activity: Not on file  Lifestyle  . Physical activity    Days per week: Not on file    Minutes per session: Not on file  . Stress: Not on file  Relationships  . Social Herbalist on phone: Not on file    Gets together: Not on file    Attends religious service: Not on file    Active member of club or organization: Not on file    Attends meetings of clubs or organizations: Not on file    Relationship status: Not on file  Other Topics Concern  . Not on file  Social History Narrative  . Not on file     Family History: The patient's family history includes Cancer in his brother and mother; Heart disease in his brother, father, and sister.  ROS:   Please see the history of present illness.    All other systems reviewed and are negative.  EKGs/Labs/Other Studies Reviewed:    The following studies were reviewed today: I reviewed previous records and discussed with him   Recent Labs: No results found for requested labs within last 8760 hours.  Recent Lipid Panel No results found for: CHOL, TRIG, HDL, CHOLHDL, VLDL, LDLCALC, LDLDIRECT  Physical Exam:    VS:  BP 128/82 (BP Location: Left Arm, Patient Position: Sitting, Cuff Size: Normal)   Pulse 91   Ht 5\' 9"  (1.753 m)   Wt 205 lb (93 kg)   SpO2 97%   BMI 30.27 kg/m     Wt Readings from Last 3 Encounters:  01/20/19 205 lb (93 kg)  03/24/18 209 lb (94.8 kg)  10/14/17 215 lb (97.5 kg)     GEN: Patient is in no acute distress HEENT: Normal NECK: No JVD; No carotid bruits LYMPHATICS: No lymphadenopathy CARDIAC: Hear sounds irregular, 2/6 systolic murmur at the apex. RESPIRATORY:  Clear to auscultation without rales, wheezing or rhonchi  ABDOMEN: Soft, non-tender, non-distended MUSCULOSKELETAL:  No edema; No deformity  SKIN:  Warm and dry NEUROLOGIC:  Alert and oriented x 3 PSYCHIATRIC:  Normal affect   Signed, Jenean Lindau, MD  01/20/2019 2:25 PM    [facility]

## 2019-01-20 NOTE — Patient Instructions (Signed)

## 2019-02-08 DIAGNOSIS — M810 Age-related osteoporosis without current pathological fracture: Secondary | ICD-10-CM | POA: Diagnosis not present

## 2019-03-04 DIAGNOSIS — K2270 Barrett's esophagus without dysplasia: Secondary | ICD-10-CM | POA: Diagnosis not present

## 2019-03-16 ENCOUNTER — Other Ambulatory Visit: Payer: Self-pay | Admitting: Cardiology

## 2019-03-29 DIAGNOSIS — C61 Malignant neoplasm of prostate: Secondary | ICD-10-CM | POA: Diagnosis not present

## 2019-03-29 DIAGNOSIS — N401 Enlarged prostate with lower urinary tract symptoms: Secondary | ICD-10-CM | POA: Diagnosis not present

## 2019-04-14 DIAGNOSIS — K2270 Barrett's esophagus without dysplasia: Secondary | ICD-10-CM | POA: Diagnosis not present

## 2019-04-20 DIAGNOSIS — L821 Other seborrheic keratosis: Secondary | ICD-10-CM | POA: Diagnosis not present

## 2019-04-20 DIAGNOSIS — C44319 Basal cell carcinoma of skin of other parts of face: Secondary | ICD-10-CM | POA: Diagnosis not present

## 2019-04-20 DIAGNOSIS — L814 Other melanin hyperpigmentation: Secondary | ICD-10-CM | POA: Diagnosis not present

## 2019-04-20 DIAGNOSIS — D225 Melanocytic nevi of trunk: Secondary | ICD-10-CM | POA: Diagnosis not present

## 2019-04-20 DIAGNOSIS — D485 Neoplasm of uncertain behavior of skin: Secondary | ICD-10-CM | POA: Diagnosis not present

## 2019-04-27 DIAGNOSIS — C44319 Basal cell carcinoma of skin of other parts of face: Secondary | ICD-10-CM | POA: Diagnosis not present

## 2019-04-28 DIAGNOSIS — I10 Essential (primary) hypertension: Secondary | ICD-10-CM | POA: Diagnosis not present

## 2019-04-28 DIAGNOSIS — Z23 Encounter for immunization: Secondary | ICD-10-CM | POA: Diagnosis not present

## 2019-04-28 DIAGNOSIS — I4891 Unspecified atrial fibrillation: Secondary | ICD-10-CM | POA: Diagnosis not present

## 2019-04-28 DIAGNOSIS — E119 Type 2 diabetes mellitus without complications: Secondary | ICD-10-CM | POA: Diagnosis not present

## 2019-05-11 DIAGNOSIS — H5789 Other specified disorders of eye and adnexa: Secondary | ICD-10-CM | POA: Diagnosis not present

## 2019-05-11 DIAGNOSIS — H029 Unspecified disorder of eyelid: Secondary | ICD-10-CM | POA: Diagnosis not present

## 2019-05-11 DIAGNOSIS — Z961 Presence of intraocular lens: Secondary | ICD-10-CM | POA: Diagnosis not present

## 2019-05-11 DIAGNOSIS — E119 Type 2 diabetes mellitus without complications: Secondary | ICD-10-CM | POA: Diagnosis not present

## 2019-05-18 ENCOUNTER — Other Ambulatory Visit: Payer: Self-pay

## 2019-05-19 ENCOUNTER — Other Ambulatory Visit: Payer: Self-pay | Admitting: *Deleted

## 2019-05-19 NOTE — Patient Outreach (Signed)
[facility]) Care Management  05/19/2019  Todd Sampson April 29, 1935 [id number]   Telephone Screen  Referral Date:  05/18/2019 Referral Source:  EMMI Prevent Reason for Referral:  Screening Insurance:  Medicare   Outreach Attempt:  Successful telephone outreach to patient for telephone screening.  HIPAA verified with patient.  [facility] services reviewed and dicussed.  Patient declining screening and [facility] services.  States he is managing well.  Agrees to receive Successful Outreach Letter with [facility] brochure.  Encouraged patient to contact [facility] in the future if needs arise.  Plan: RN Health Coach will send patient Successful Letter with [facility] pamphlet. RN Health Coach will close case and make inactive with [facility] as patient is declining services at this time.  [place] [phone] [email or web address]

## 2019-06-15 ENCOUNTER — Other Ambulatory Visit: Payer: Self-pay | Admitting: Cardiology

## 2019-06-15 NOTE — Telephone Encounter (Signed)
Refill Request.  

## 2019-06-15 NOTE — Telephone Encounter (Signed)
66M 93KG SCR 1.180 MG/ 01/20/2019 Lovw/revankar 01/20/19

## 2019-07-14 ENCOUNTER — Other Ambulatory Visit: Payer: Self-pay | Admitting: Cardiology

## 2019-07-26 ENCOUNTER — Encounter: Payer: Self-pay | Admitting: Cardiology

## 2019-07-26 ENCOUNTER — Other Ambulatory Visit: Payer: Self-pay

## 2019-07-26 ENCOUNTER — Ambulatory Visit (INDEPENDENT_AMBULATORY_CARE_PROVIDER_SITE_OTHER): Payer: Medicare Other | Admitting: Cardiology

## 2019-07-26 VITALS — BP 148/80 | HR 38 | Ht 69.0 in | Wt 214.0 lb

## 2019-07-26 DIAGNOSIS — Z951 Presence of aortocoronary bypass graft: Secondary | ICD-10-CM | POA: Diagnosis not present

## 2019-07-26 DIAGNOSIS — Z1329 Encounter for screening for other suspected endocrine disorder: Secondary | ICD-10-CM

## 2019-07-26 DIAGNOSIS — E119 Type 2 diabetes mellitus without complications: Secondary | ICD-10-CM | POA: Diagnosis not present

## 2019-07-26 DIAGNOSIS — I340 Nonrheumatic mitral (valve) insufficiency: Secondary | ICD-10-CM

## 2019-07-26 DIAGNOSIS — E785 Hyperlipidemia, unspecified: Secondary | ICD-10-CM | POA: Diagnosis not present

## 2019-07-26 DIAGNOSIS — I4821 Permanent atrial fibrillation: Secondary | ICD-10-CM

## 2019-07-26 DIAGNOSIS — I10 Essential (primary) hypertension: Secondary | ICD-10-CM

## 2019-07-26 HISTORY — DX: Nonrheumatic mitral (valve) insufficiency: I34.0

## 2019-07-26 NOTE — Progress Notes (Signed)
Cardiology Office Note:    Date:  07/26/2019   ID:  Todd Sampson, DOB April 04, 1935, MRN [id number]  PCP:  Todd Roger, MD  Cardiologist:  Todd Lindau, MD   Referring MD: Todd Roger, MD    ASSESSMENT:    1. Essential hypertension   2. Permanent atrial fibrillation ([place])   3. Diet-controlled diabetes mellitus (Todd Sampson)   4. Dyslipidemia (high LDL; low HDL)   5. Hx of CABG   6. Mitral valve insufficiency, unspecified etiology    PLAN:    In order of problems listed above:  1. Coronary artery disease: Secondary prevention stressed with the patient.  Importance of compliance with diet and medication stressed and he vocalized understanding. 2. Essential hypertension: Blood pressure is stable 3. Permanent atrial fibrillation:I discussed with the patient atrial fibrillation, disease process. Management and therapy including rate and rhythm control, anticoagulation benefits and potential risks were discussed extensively with the patient. Patient had multiple questions which were answered to patient's satisfaction.  EKG done today reveals atrial fibrillation with slow ventricular rate of 38 and nonspecific ST-T changes. 4. Bradycardia arrhythmia: In view of this I have discontinued his carvedilol.  His heart rate is 38 and he is asymptomatic.  I will see him back in 2 weeks at which time he will have complete fasting blood work including lipids 5. Mixed dyslipidemia: Diet was emphasized.  Importance of regular exercise stressed and he promises to do better.   Medication Adjustments/Labs and Tests Ordered: Current medicines are reviewed at length with the patient today.  Concerns regarding medicines are outlined above.  No orders of the defined types were placed in this encounter.  No orders of the defined types were placed in this encounter.    Chief Complaint  Patient presents with  . Follow-up     History of Present Illness:    Todd Sampson is a 85 y.o. male.  Patient has  past medical history of coronary artery disease post CABG, essential hypertension, dyslipidemia, permanent atrial fibrillation.  He denies any problems at this time and takes care of activities of daily living.  No chest pain orthopnea or PND.  No dizzy spells or any such issues.  No syncope.  At the time of my evaluation, the patient is alert awake oriented and in no distress.  Past Medical History:  Diagnosis Date  . Anemia   . Barrett's esophagus   . Diabetes mellitus   . Guillain-Barre disease (Todd Sampson)   . H/O: hemorrhoidectomy   . Heart disease   . Hyperlipidemia   . Hypertension   . Prostate cancer (Todd Sampson)   . Salivary gland calculi     Past Surgical History:  Procedure Laterality Date  . APPENDECTOMY    . CARDIAC BYPASS    . REPLACEMENT TOTAL KNEE BILATERAL      Current Medications: Current Meds  Medication Sig  . amLODipine (NORVASC) 10 MG tablet Take 10 mg by mouth daily.    . carvedilol (COREG) 3.125 MG tablet TAKE ONE TABLET BY MOUTH TWICE DAILY WITH meal  . ELIQUIS 5 MG TABS tablet TAKE 1 TABLET BY MOUTH TWICE A DAY  . finasteride (PROSCAR) 5 MG tablet Take 10 mg by mouth daily.   . folic acid (FOLVITE) 1 MG tablet Take 1 mg by mouth daily.    . isosorbide mononitrate (IMDUR) 60 MG 24 hr tablet Take 60 mg by mouth daily.   [name] lisinopril (PRINIVIL,ZESTRIL) 5 MG tablet Take 5 mg by  mouth daily.  [name] lovastatin (MEVACOR) 20 MG tablet Take 1 tablet (20 mg total) by mouth daily at 6 PM.  . Omega-3 Fatty Acids (FISH OIL) 1000 MG CAPS Take by mouth.  . pantoprazole (PROTONIX) 40 MG tablet Take 40 mg by mouth daily.  [name] thiamine 100 MG tablet Take 100 mg by mouth daily.     Allergies:   Penicillins   Social History   Socioeconomic History  . Marital status: Married    Spouse name: Not on file  . Number of children: Not on file  . Years of education: Not on file  . Highest education level: Not on file  Occupational History  . Not on file  Tobacco Use  . Smoking status:  Former Research scientist (life sciences)  . Smokeless tobacco: Never Used  Substance and Sexual Activity  . Alcohol use: No  . Drug use: No  . Sexual activity: Not on file  Other Topics Concern  . Not on file  Social History Narrative  . Not on file   Social Determinants of Health   Financial Resource Strain:   . Difficulty of Paying Living Expenses: Not on file  Food Insecurity:   . Worried About Charity fundraiser in the Last Year: Not on file  . Ran Out of Food in the Last Year: Not on file  Transportation Needs:   . Lack of Transportation (Medical): Not on file  . Lack of Transportation (Non-Medical): Not on file  Physical Activity:   . Days of Exercise per Week: Not on file  . Minutes of Exercise per Session: Not on file  Stress:   . Feeling of Stress : Not on file  Social Connections:   . Frequency of Communication with Friends and Family: Not on file  . Frequency of Social Gatherings with Friends and Family: Not on file  . Attends Religious Services: Not on file  . Active Member of Clubs or Organizations: Not on file  . Attends Archivist Meetings: Not on file  . Marital Status: Not on file     Family History: The patient's family history includes Cancer in his brother and mother; Heart disease in his brother, father, and sister.  ROS:   Please see the history of present illness.    All other systems reviewed and are negative.  EKGs/Labs/Other Studies Reviewed:    The following studies were reviewed today: Study Highlights    Nuclear stress EF: 48%. There was septal wall hypokinesis (post CABG changes)  There was no ST segment deviation noted during stress.  The study is normal.  This is a low risk study. Other than mildly reduced ejection fraction calculated at 48%, there is no ischemia identified.   Candee Furbish, MD   Study Conclusions  - Left ventricle: The cavity size was normal. Wall thickness was   normal. The estimated ejection fraction was 55%. Doppler    parameters are consistent with both elevated ventricular   end-diastolic filling pressure and elevated left atrial filling   pressure. - Aortic valve: There was mild regurgitation. Valve area (VTI):   2.62 cm^2. Valve area (Vmax): 2.96 cm^2. Valve area (Vmean): 3.08   cm^2. - Mitral valve: Calcified annulus. There was mild regurgitation. - Left atrium: The atrium was moderately dilated. - Right atrium: The atrium was moderately dilated. - Atrial septum: A patent foramen ovale cannot be excluded. - Tricuspid valve: There was moderate regurgitation.   Recent Labs: No results found for requested labs within last 8760  hours.  Recent Lipid Panel No results found for: CHOL, TRIG, HDL, CHOLHDL, VLDL, LDLCALC, LDLDIRECT  Physical Exam:    VS:  Ht 5\' 9"  (1.753 m)   Wt 214 lb (97.1 kg)   SpO2 97%   BMI 31.60 kg/m     Wt Readings from Last 3 Encounters:  07/26/19 214 lb (97.1 kg)  01/20/19 205 lb (93 kg)  03/24/18 209 lb (94.8 kg)     GEN: Patient is in no acute distress HEENT: Normal NECK: No JVD; No carotid bruits LYMPHATICS: No lymphadenopathy CARDIAC: Hear sounds regular, 2/6 systolic murmur at the apex. RESPIRATORY:  Clear to auscultation without rales, wheezing or rhonchi  ABDOMEN: Soft, non-tender, non-distended MUSCULOSKELETAL:  No edema; No deformity  SKIN: Warm and dry NEUROLOGIC:  Alert and oriented x 3 PSYCHIATRIC:  Normal affect   Signed, Todd Lindau, MD  07/26/2019 11:13 AM    [facility]

## 2019-07-26 NOTE — Patient Instructions (Signed)
Medication Instructions:  Your physician has recommended you make the following change in your medication:  STOP taking coreg *If you need a refill on your cardiac medications before your next appointment, please call your pharmacy*  Lab Work: Your physician recommends that you return FASTING for BMP, CBC, TSH, Hepatic and lipid to be drawn  If you have labs (blood work) drawn today and your tests are completely normal, you will receive your results only by: [name] MyChart Message (if you have MyChart) OR . A paper copy in the mail If you have any lab test that is abnormal or we need to change your treatment, we will call you to review the results.  Testing/Procedures: You had an EKG performed  Follow-Up: At [facility], you and your health needs are our priority.  As part of our continuing mission to provide you with exceptional heart care, we have created designated Provider Care Teams.  These Care Teams include your primary Cardiologist (physician) and Advanced Practice Providers (APPs -  Physician Assistants and Nurse Practitioners) who all work together to provide you with the care you need, when you need it.  Your next appointment:   2 week(s)  The format for your next appointment:   In Person  Provider:   Jyl Heinz, MD

## 2019-07-26 NOTE — Addendum Note (Signed)
Addended by: Beckey Rutter on: 07/26/2019 12:01 PM   Modules accepted: Orders

## 2019-07-29 DIAGNOSIS — I4891 Unspecified atrial fibrillation: Secondary | ICD-10-CM | POA: Diagnosis not present

## 2019-07-29 DIAGNOSIS — E119 Type 2 diabetes mellitus without complications: Secondary | ICD-10-CM | POA: Diagnosis not present

## 2019-07-29 DIAGNOSIS — I10 Essential (primary) hypertension: Secondary | ICD-10-CM | POA: Diagnosis not present

## 2019-08-05 DIAGNOSIS — Z951 Presence of aortocoronary bypass graft: Secondary | ICD-10-CM | POA: Diagnosis not present

## 2019-08-05 DIAGNOSIS — I340 Nonrheumatic mitral (valve) insufficiency: Secondary | ICD-10-CM | POA: Diagnosis not present

## 2019-08-05 DIAGNOSIS — I10 Essential (primary) hypertension: Secondary | ICD-10-CM | POA: Diagnosis not present

## 2019-08-05 DIAGNOSIS — Z1329 Encounter for screening for other suspected endocrine disorder: Secondary | ICD-10-CM | POA: Diagnosis not present

## 2019-08-05 DIAGNOSIS — I4821 Permanent atrial fibrillation: Secondary | ICD-10-CM | POA: Diagnosis not present

## 2019-08-05 DIAGNOSIS — E785 Hyperlipidemia, unspecified: Secondary | ICD-10-CM | POA: Diagnosis not present

## 2019-08-06 LAB — CBC
Hematocrit: 44.4 % (ref 37.5–51.0)
Hemoglobin: 15.3 g/dL (ref 13.0–17.7)
MCH: 30.5 pg (ref 26.6–33.0)
MCHC: 34.5 g/dL (ref 31.5–35.7)
MCV: 89 fL (ref 79–97)
Platelets: 174 10*3/uL (ref 150–450)
RBC: 5.01 x10E6/uL (ref 4.14–5.80)
RDW: 13.4 % (ref 11.6–15.4)
WBC: 6.6 10*3/uL (ref 3.4–10.8)

## 2019-08-06 LAB — BASIC METABOLIC PANEL
BUN/Creatinine Ratio: 15 (ref 10–24)
BUN: 18 mg/dL (ref 8–27)
CO2: 24 mmol/L (ref 20–29)
Calcium: 9.4 mg/dL (ref 8.6–10.2)
Chloride: 102 mmol/L (ref 96–106)
Creatinine, Ser: 1.17 mg/dL (ref 0.76–1.27)
GFR calc Af Amer: 66 mL/min/{1.73_m2} (ref >59)
GFR calc non Af Amer: 57 mL/min/{1.73_m2} — ABNORMAL LOW (ref >59)
Glucose: 101 mg/dL — ABNORMAL HIGH (ref 65–99)
Potassium: 4.4 mmol/L (ref 3.5–5.2)
Sodium: 139 mmol/L (ref 134–144)

## 2019-08-06 LAB — HEPATIC FUNCTION PANEL
ALT: 18 IU/L (ref 0–44)
AST: 26 IU/L (ref 0–40)
Albumin: 4.3 g/dL (ref 3.6–4.6)
Alkaline Phosphatase: 58 IU/L (ref 39–117)
Bilirubin Total: 0.5 mg/dL (ref 0.0–1.2)
Bilirubin, Direct: 0.16 mg/dL (ref 0.00–0.40)
Total Protein: 7.1 g/dL (ref 6.0–8.5)

## 2019-08-06 LAB — LIPID PANEL
Chol/HDL Ratio: 3.4 ratio (ref 0.0–5.0)
Cholesterol, Total: 102 mg/dL (ref 100–199)
HDL: 30 mg/dL — ABNORMAL LOW (ref >39)
LDL Chol Calc (NIH): 58 mg/dL (ref 0–99)
Triglycerides: 60 mg/dL (ref 0–149)
VLDL Cholesterol Cal: 14 mg/dL (ref 5–40)

## 2019-08-06 LAB — TSH: TSH: 2.22 u[IU]/mL (ref 0.450–4.500)

## 2019-08-09 ENCOUNTER — Encounter: Payer: Self-pay | Admitting: Cardiology

## 2019-08-09 ENCOUNTER — Other Ambulatory Visit: Payer: Self-pay

## 2019-08-09 ENCOUNTER — Ambulatory Visit (INDEPENDENT_AMBULATORY_CARE_PROVIDER_SITE_OTHER): Payer: Medicare Other | Admitting: Cardiology

## 2019-08-09 VITALS — BP 140/80 | HR 64 | Resp 18 | Ht 69.0 in | Wt 212.1 lb

## 2019-08-09 DIAGNOSIS — I4821 Permanent atrial fibrillation: Secondary | ICD-10-CM | POA: Diagnosis not present

## 2019-08-09 DIAGNOSIS — Z951 Presence of aortocoronary bypass graft: Secondary | ICD-10-CM

## 2019-08-09 DIAGNOSIS — I25118 Atherosclerotic heart disease of native coronary artery with other forms of angina pectoris: Secondary | ICD-10-CM | POA: Diagnosis not present

## 2019-08-09 DIAGNOSIS — E785 Hyperlipidemia, unspecified: Secondary | ICD-10-CM

## 2019-08-09 DIAGNOSIS — I340 Nonrheumatic mitral (valve) insufficiency: Secondary | ICD-10-CM

## 2019-08-09 DIAGNOSIS — I10 Essential (primary) hypertension: Secondary | ICD-10-CM

## 2019-08-09 NOTE — Progress Notes (Signed)
Cardiology Office Note:    Date:  08/09/2019   ID:  Todd Sampson, DOB 1934/07/19, MRN [id number]  PCP:  Townsend Roger, MD  Cardiologist:  Jenean Lindau, MD   Referring MD: Townsend Roger, MD    ASSESSMENT:    1. Coronary artery disease of native artery of native heart with stable angina pectoris ([place])   2. Essential hypertension   3. Permanent atrial fibrillation ([place])   4. Mitral valve insufficiency, unspecified etiology   5. Dyslipidemia (high LDL; low HDL)   6. Hx of CABG    PLAN:    In order of problems listed above:  1. Coronary artery disease: Secondary prevention stressed to the patient.  Importance of compliance with diet and medication stressed and he vocalized understanding.  Importance of regular walking and weight reduction was stressed and he promises to do better. 2. Essential hypertension: He keeps a track of his blood pressures his primary care physician change his blood pressure recently he is not able to tell me which 1 will have a Chem-7 done today.  His blood pressure is stable.  Diet and salt intake issues were discussed with him and he understood. 3. Mixed dyslipidemia: Diet was discussed for dyslipidemia.  Lipids managed by primary care physician. 4. Post CABG: Stable at this time. 5. Patient will be seen in follow-up appointment in 6 months or earlier if the patient has any concerns    Medication Adjustments/Labs and Tests Ordered: Current medicines are reviewed at length with the patient today.  Concerns regarding medicines are outlined above.  No orders of the defined types were placed in this encounter.  No orders of the defined types were placed in this encounter.    Chief Complaint  Patient presents with  . Follow-up    2 week follow up. Denies any complaints of chest pain, lightheadness or shortness of breath.     History of Present Illness:    Todd Sampson is a 85 y.o. male.  Patient has past medical history of coronary artery  disease, essential hypertension, permanent atrial fibrillation and dyslipidemia.  He is post CABG surgery.  He denies any problems at this time and takes care of activities of daily living.  No chest pain orthopnea or PND.  At the time of my evaluation, the patient is alert awake oriented and in no distress.  Past Medical History:  Diagnosis Date  . Anemia   . Barrett's esophagus   . Diabetes mellitus   . Guillain-Barre disease ([place])   . H/O: hemorrhoidectomy   . Heart disease   . Hyperlipidemia   . Hypertension   . Prostate cancer ([place])   . Salivary gland calculi     Past Surgical History:  Procedure Laterality Date  . APPENDECTOMY    . CARDIAC BYPASS    . REPLACEMENT TOTAL KNEE BILATERAL      Current Medications: Current Meds  Medication Sig  . amLODipine (NORVASC) 10 MG tablet Take 10 mg by mouth daily.    [name] ELIQUIS 5 MG TABS tablet TAKE 1 TABLET BY MOUTH TWICE A DAY  . finasteride (PROSCAR) 5 MG tablet Take 10 mg by mouth daily.   . folic acid (FOLVITE) 1 MG tablet Take 1 mg by mouth daily.    . isosorbide mononitrate (IMDUR) 60 MG 24 hr tablet Take 60 mg by mouth daily.   [name] lisinopril (PRINIVIL,ZESTRIL) 5 MG tablet Take 5 mg by mouth daily.  [name] lovastatin (MEVACOR) 20 MG  tablet Take 1 tablet (20 mg total) by mouth daily at 6 PM.  . Omega-3 Fatty Acids (FISH OIL) 1000 MG CAPS Take by mouth.  . pantoprazole (PROTONIX) 40 MG tablet Take 40 mg by mouth daily.  [name] thiamine 100 MG tablet Take 100 mg by mouth daily.     Allergies:   Penicillins   Social History   Socioeconomic History  . Marital status: Married    Spouse name: Not on file  . Number of children: Not on file  . Years of education: Not on file  . Highest education level: Not on file  Occupational History  . Not on file  Tobacco Use  . Smoking status: Former Research scientist (life sciences)  . Smokeless tobacco: Never Used  Substance and Sexual Activity  . Alcohol use: No  . Drug use: No  . Sexual activity: Not on file  Other  Topics Concern  . Not on file  Social History Narrative  . Not on file   Social Determinants of Health   Financial Resource Strain:   . Difficulty of Paying Living Expenses: Not on file  Food Insecurity:   . Worried About Charity fundraiser in the Last Year: Not on file  . Ran Out of Food in the Last Year: Not on file  Transportation Needs:   . Lack of Transportation (Medical): Not on file  . Lack of Transportation (Non-Medical): Not on file  Physical Activity:   . Days of Exercise per Week: Not on file  . Minutes of Exercise per Session: Not on file  Stress:   . Feeling of Stress : Not on file  Social Connections:   . Frequency of Communication with Friends and Family: Not on file  . Frequency of Social Gatherings with Friends and Family: Not on file  . Attends Religious Services: Not on file  . Active Member of Clubs or Organizations: Not on file  . Attends Archivist Meetings: Not on file  . Marital Status: Not on file     Family History: The patient's family history includes Cancer in his brother and mother; Heart disease in his brother, father, and sister.  ROS:   Please see the history of present illness.    All other systems reviewed and are negative.  EKGs/Labs/Other Studies Reviewed:    The following studies were reviewed today: I discussed my findings with the patient at extensive length.   Recent Labs: 08/05/2019: ALT 18; BUN 18; Creatinine, Ser 1.17; Hemoglobin 15.3; Platelets 174; Potassium 4.4; Sodium 139; TSH 2.220  Recent Lipid Panel    Component Value Date/Time   CHOL 102 08/05/2019 0818   TRIG 60 08/05/2019 0818   HDL 30 (L) 08/05/2019 0818   CHOLHDL 3.4 08/05/2019 0818   LDLCALC 58 08/05/2019 0818    Physical Exam:    VS:  BP 140/80 (BP Location: Right Arm, Patient Position: Sitting, Cuff Size: Normal)   Pulse 64   Resp 18   Ht 5\' 9"  (1.753 m)   Wt 212 lb 2 oz (96.2 kg)   SpO2 98%   BMI 31.33 kg/m     Wt Readings from Last 3  Encounters:  08/09/19 212 lb 2 oz (96.2 kg)  07/26/19 214 lb (97.1 kg)  01/20/19 205 lb (93 kg)     GEN: Patient is in no acute distress HEENT: Normal NECK: No JVD; No carotid bruits LYMPHATICS: No lymphadenopathy CARDIAC: Hear sounds regular, 2/6 systolic murmur at the apex. RESPIRATORY:  Clear to auscultation  without rales, wheezing or rhonchi  ABDOMEN: Soft, non-tender, non-distended MUSCULOSKELETAL:  No edema; No deformity  SKIN: Warm and dry NEUROLOGIC:  Alert and oriented x 3 PSYCHIATRIC:  Normal affect   Signed, Jenean Lindau, MD  08/09/2019 11:08 AM    [facility]

## 2019-08-09 NOTE — Patient Instructions (Signed)
Medication Instructions:  No medication changes *If you need a refill on your cardiac medications before your next appointment, please call your pharmacy*  Lab Work: You had a BMET today in the office. If you have labs (blood work) drawn today and your tests are completely normal, you will receive your results only by: [name] MyChart Message (if you have MyChart) OR . A paper copy in the mail If you have any lab test that is abnormal or we need to change your treatment, we will call you to review the results.  Testing/Procedures: None ordered  Follow-Up: At [facility], you and your health needs are our priority.  As part of our continuing mission to provide you with exceptional heart care, we have created designated Provider Care Teams.  These Care Teams include your primary Cardiologist (physician) and Advanced Practice Providers (APPs -  Physician Assistants and Nurse Practitioners) who all work together to provide you with the care you need, when you need it.  Your next appointment:   6 month(s)  The format for your next appointment:   In Person  Provider:   Jyl Heinz, MD  Other Instructions NA

## 2019-08-10 LAB — BASIC METABOLIC PANEL
BUN/Creatinine Ratio: 14 (ref 10–24)
BUN: 15 mg/dL (ref 8–27)
CO2: 21 mmol/L (ref 20–29)
Calcium: 9.7 mg/dL (ref 8.6–10.2)
Chloride: 105 mmol/L (ref 96–106)
Creatinine, Ser: 1.06 mg/dL (ref 0.76–1.27)
GFR calc Af Amer: 75 mL/min/{1.73_m2} (ref >59)
GFR calc non Af Amer: 65 mL/min/{1.73_m2} (ref >59)
Glucose: 99 mg/dL (ref 65–99)
Potassium: 4.7 mmol/L (ref 3.5–5.2)
Sodium: 142 mmol/L (ref 134–144)

## 2019-08-17 DIAGNOSIS — L821 Other seborrheic keratosis: Secondary | ICD-10-CM | POA: Diagnosis not present

## 2019-08-17 DIAGNOSIS — L570 Actinic keratosis: Secondary | ICD-10-CM | POA: Diagnosis not present

## 2019-09-10 DIAGNOSIS — M79674 Pain in right toe(s): Secondary | ICD-10-CM | POA: Insufficient documentation

## 2019-09-10 DIAGNOSIS — L84 Corns and callosities: Secondary | ICD-10-CM | POA: Diagnosis not present

## 2019-09-10 DIAGNOSIS — E119 Type 2 diabetes mellitus without complications: Secondary | ICD-10-CM | POA: Diagnosis not present

## 2019-09-10 DIAGNOSIS — M79675 Pain in left toe(s): Secondary | ICD-10-CM

## 2019-09-10 HISTORY — DX: Pain in left toe(s): M79.675

## 2019-09-10 HISTORY — DX: Pain in right toe(s): M79.674

## 2019-09-20 ENCOUNTER — Telehealth: Payer: Self-pay | Admitting: Cardiology

## 2019-09-20 NOTE — Telephone Encounter (Signed)
*  STAT* If patient is at the pharmacy, call can be transferred to refill team.   1. Which medications need to be refilled? (please list name of each medication and dose if known) ELIQUIS 5 MG TABS tablet  2. Which pharmacy/location (including street and city if local pharmacy) is medication to be sent to? [facility], [place]  3. Do they need a 30 day or 90 day supply? 90 day  Patient is out of medication

## 2019-09-21 NOTE — Telephone Encounter (Signed)
[facility] calling back, stating they have not received anything yet.

## 2019-09-22 ENCOUNTER — Other Ambulatory Visit: Payer: Self-pay

## 2019-09-22 MED ORDER — APIXABAN 5 MG PO TABS: 5.0000 mg | ORAL_TABLET | Freq: Two times a day (BID) | ORAL | 3 refills | Status: DC

## 2019-09-22 NOTE — Telephone Encounter (Signed)
Pl check what this is about

## 2019-09-27 DIAGNOSIS — C61 Malignant neoplasm of prostate: Secondary | ICD-10-CM | POA: Diagnosis not present

## 2019-09-27 DIAGNOSIS — R351 Nocturia: Secondary | ICD-10-CM | POA: Diagnosis not present

## 2019-10-21 DIAGNOSIS — R112 Nausea with vomiting, unspecified: Secondary | ICD-10-CM | POA: Diagnosis not present

## 2019-10-21 DIAGNOSIS — I10 Essential (primary) hypertension: Secondary | ICD-10-CM | POA: Diagnosis not present

## 2019-10-21 DIAGNOSIS — R0789 Other chest pain: Secondary | ICD-10-CM | POA: Diagnosis not present

## 2019-10-21 DIAGNOSIS — R0989 Other specified symptoms and signs involving the circulatory and respiratory systems: Secondary | ICD-10-CM | POA: Diagnosis not present

## 2019-10-21 DIAGNOSIS — R079 Chest pain, unspecified: Secondary | ICD-10-CM | POA: Diagnosis not present

## 2019-10-21 DIAGNOSIS — K7689 Other specified diseases of liver: Secondary | ICD-10-CM | POA: Diagnosis not present

## 2019-10-21 DIAGNOSIS — R110 Nausea: Secondary | ICD-10-CM | POA: Diagnosis not present

## 2019-10-21 DIAGNOSIS — N281 Cyst of kidney, acquired: Secondary | ICD-10-CM | POA: Diagnosis not present

## 2019-10-21 DIAGNOSIS — R509 Fever, unspecified: Secondary | ICD-10-CM | POA: Diagnosis not present

## 2019-10-21 DIAGNOSIS — R1012 Left upper quadrant pain: Secondary | ICD-10-CM | POA: Diagnosis not present

## 2019-10-21 DIAGNOSIS — I4891 Unspecified atrial fibrillation: Secondary | ICD-10-CM | POA: Diagnosis not present

## 2019-10-22 DIAGNOSIS — I2510 Atherosclerotic heart disease of native coronary artery without angina pectoris: Secondary | ICD-10-CM | POA: Diagnosis not present

## 2019-10-22 DIAGNOSIS — K219 Gastro-esophageal reflux disease without esophagitis: Secondary | ICD-10-CM | POA: Diagnosis present

## 2019-10-22 DIAGNOSIS — I480 Paroxysmal atrial fibrillation: Secondary | ICD-10-CM | POA: Diagnosis present

## 2019-10-22 DIAGNOSIS — R0989 Other specified symptoms and signs involving the circulatory and respiratory systems: Secondary | ICD-10-CM | POA: Diagnosis not present

## 2019-10-22 DIAGNOSIS — R109 Unspecified abdominal pain: Secondary | ICD-10-CM | POA: Diagnosis not present

## 2019-10-22 DIAGNOSIS — Z79899 Other long term (current) drug therapy: Secondary | ICD-10-CM | POA: Diagnosis not present

## 2019-10-22 DIAGNOSIS — Z880 Allergy status to penicillin: Secondary | ICD-10-CM | POA: Diagnosis not present

## 2019-10-22 DIAGNOSIS — E785 Hyperlipidemia, unspecified: Secondary | ICD-10-CM | POA: Diagnosis present

## 2019-10-22 DIAGNOSIS — R509 Fever, unspecified: Secondary | ICD-10-CM | POA: Diagnosis present

## 2019-10-22 DIAGNOSIS — I10 Essential (primary) hypertension: Secondary | ICD-10-CM | POA: Diagnosis present

## 2019-10-22 DIAGNOSIS — Z951 Presence of aortocoronary bypass graft: Secondary | ICD-10-CM | POA: Diagnosis not present

## 2019-10-22 DIAGNOSIS — E119 Type 2 diabetes mellitus without complications: Secondary | ICD-10-CM | POA: Diagnosis present

## 2019-10-22 DIAGNOSIS — Z7902 Long term (current) use of antithrombotics/antiplatelets: Secondary | ICD-10-CM | POA: Diagnosis not present

## 2019-10-22 DIAGNOSIS — M1990 Unspecified osteoarthritis, unspecified site: Secondary | ICD-10-CM | POA: Diagnosis present

## 2019-10-22 DIAGNOSIS — R1012 Left upper quadrant pain: Secondary | ICD-10-CM | POA: Diagnosis not present

## 2019-10-22 DIAGNOSIS — N400 Enlarged prostate without lower urinary tract symptoms: Secondary | ICD-10-CM | POA: Diagnosis present

## 2019-10-22 DIAGNOSIS — K7689 Other specified diseases of liver: Secondary | ICD-10-CM | POA: Diagnosis not present

## 2019-10-22 DIAGNOSIS — K811 Chronic cholecystitis: Secondary | ICD-10-CM | POA: Diagnosis not present

## 2019-10-22 DIAGNOSIS — Z66 Do not resuscitate: Secondary | ICD-10-CM | POA: Diagnosis present

## 2019-10-22 DIAGNOSIS — N281 Cyst of kidney, acquired: Secondary | ICD-10-CM | POA: Diagnosis not present

## 2019-10-22 DIAGNOSIS — E860 Dehydration: Secondary | ICD-10-CM | POA: Diagnosis not present

## 2019-10-22 DIAGNOSIS — R112 Nausea with vomiting, unspecified: Secondary | ICD-10-CM | POA: Diagnosis present

## 2019-10-23 DIAGNOSIS — R112 Nausea with vomiting, unspecified: Secondary | ICD-10-CM | POA: Diagnosis not present

## 2019-10-23 DIAGNOSIS — R509 Fever, unspecified: Secondary | ICD-10-CM | POA: Diagnosis not present

## 2019-10-29 DIAGNOSIS — K811 Chronic cholecystitis: Secondary | ICD-10-CM | POA: Diagnosis not present

## 2019-11-09 DIAGNOSIS — K429 Umbilical hernia without obstruction or gangrene: Secondary | ICD-10-CM

## 2019-11-09 DIAGNOSIS — Z6832 Body mass index (BMI) 32.0-32.9, adult: Secondary | ICD-10-CM

## 2019-11-09 DIAGNOSIS — R112 Nausea with vomiting, unspecified: Secondary | ICD-10-CM | POA: Insufficient documentation

## 2019-11-09 DIAGNOSIS — Z1159 Encounter for screening for other viral diseases: Secondary | ICD-10-CM

## 2019-11-09 DIAGNOSIS — K8010 Calculus of gallbladder with chronic cholecystitis without obstruction: Secondary | ICD-10-CM | POA: Insufficient documentation

## 2019-11-09 DIAGNOSIS — Z7901 Long term (current) use of anticoagulants: Secondary | ICD-10-CM | POA: Diagnosis not present

## 2019-11-09 HISTORY — DX: Calculus of gallbladder with chronic cholecystitis without obstruction: K80.10

## 2019-11-09 HISTORY — DX: Body mass index (BMI) 32.0-32.9, adult: Z68.32

## 2019-11-09 HISTORY — DX: Encounter for screening for other viral diseases: Z11.59

## 2019-11-09 HISTORY — DX: Nausea with vomiting, unspecified: R11.2

## 2019-11-09 HISTORY — DX: Umbilical hernia without obstruction or gangrene: K42.9

## 2019-11-12 ENCOUNTER — Telehealth: Payer: Self-pay | Admitting: *Deleted

## 2019-11-12 NOTE — Telephone Encounter (Signed)
   [facility] Pre-operative Risk Assessment    Request for surgical clearance:  1. What type of surgery is being performed? Laparoscopic Cholecystectomy   2. When is this surgery scheduled? TBD   3. What type of clearance is required (medical clearance vs. Pharmacy clearance to hold med vs. Both)? Both  4. Are there any medications that need to be held prior to surgery and how long?Eliquis   5. Practice name and name of physician performing surgery? [facility] [place], Dr. Jerel Shepherd and Dr. Melynda Ripple   6. What is your office phone number [phone]     7.   What is your office fax number [phone]  8.   Anesthesia type (None, local, MAC, general) ? Unknown   Roselie Skinner 11/12/2019, 4:40 PM  _________________________________________________________________   (provider comments below)

## 2019-11-16 NOTE — Telephone Encounter (Signed)
   Primary Cardiologist: Jenean Lindau, MD  Chart reviewed as part of pre-operative protocol coverage. Hx of CAD s/p CABG, permanent atrial fibrillation, HTN, HLD and mitral valve insufficiency.   Left voice mail to call back. Pharmacy to review anticoagulation.   [place], [place] 11/16/2019, 2:16 PM

## 2019-11-17 NOTE — Telephone Encounter (Signed)
Patient with diagnosis of A Fib on Eliquis for anticoagulation.    Procedure: Laparoscopic Cholecystectomy Date of procedure: TBD  CHADS2-VASc score of  5 (HTN, AGE x 2, DM2, CAD)  CrCl 60 mL/min using adjusted body weight Platelet count  Per office protocol, patient can hold Eliquis for 2 days prior to procedure.

## 2019-11-18 NOTE — Telephone Encounter (Signed)
   Primary Cardiologist: Todd Lindau, MD  Chart reviewed as part of pre-operative protocol coverage. Patient was contacted 11/18/2019 in reference to pre-operative risk assessment for pending surgery as outlined below.  Todd Sampson was last seen on 08/09/2019 by Dr. Geraldo Sampson. Since that day, Todd Sampson has been doing very well from a CV standpoint.  He gets greater than 4 METS of daily activity without anginal symptoms.  Patient with diagnosis of A Fib on Eliquis for anticoagulation.    Procedure: Laparoscopic Cholecystectomy Date of procedure: TBD  CHADS2-VASc score of  5 (HTN, AGE x 2, DM2, CAD)  CrCl 60 mL/min using adjusted body weight Platelet count  Per office protocol, patient can hold Eliquis for 2 days prior to procedure.    Therefore, based on ACC/AHA guidelines, the patient would be at acceptable risk for the planned procedure without further cardiovascular testing.   I will route this recommendation to the requesting party via Epic fax function and remove from pre-op pool.  Please call with questions.  Todd Drown, NP 11/18/2019, 8:59 AM

## 2019-11-25 DIAGNOSIS — Z1159 Encounter for screening for other viral diseases: Secondary | ICD-10-CM | POA: Diagnosis not present

## 2019-12-01 DIAGNOSIS — Z79899 Other long term (current) drug therapy: Secondary | ICD-10-CM | POA: Diagnosis not present

## 2019-12-01 DIAGNOSIS — Z951 Presence of aortocoronary bypass graft: Secondary | ICD-10-CM | POA: Diagnosis not present

## 2019-12-01 DIAGNOSIS — R112 Nausea with vomiting, unspecified: Secondary | ICD-10-CM | POA: Diagnosis not present

## 2019-12-01 DIAGNOSIS — Z96653 Presence of artificial knee joint, bilateral: Secondary | ICD-10-CM | POA: Diagnosis not present

## 2019-12-01 DIAGNOSIS — Z87891 Personal history of nicotine dependence: Secondary | ICD-10-CM | POA: Diagnosis not present

## 2019-12-01 DIAGNOSIS — Z7901 Long term (current) use of anticoagulants: Secondary | ICD-10-CM | POA: Diagnosis not present

## 2019-12-01 DIAGNOSIS — K219 Gastro-esophageal reflux disease without esophagitis: Secondary | ICD-10-CM | POA: Diagnosis not present

## 2019-12-01 DIAGNOSIS — K8010 Calculus of gallbladder with chronic cholecystitis without obstruction: Secondary | ICD-10-CM | POA: Diagnosis not present

## 2019-12-01 DIAGNOSIS — I10 Essential (primary) hypertension: Secondary | ICD-10-CM | POA: Diagnosis not present

## 2019-12-01 DIAGNOSIS — I2510 Atherosclerotic heart disease of native coronary artery without angina pectoris: Secondary | ICD-10-CM | POA: Diagnosis not present

## 2019-12-01 DIAGNOSIS — M1990 Unspecified osteoarthritis, unspecified site: Secondary | ICD-10-CM | POA: Diagnosis not present

## 2019-12-01 DIAGNOSIS — E119 Type 2 diabetes mellitus without complications: Secondary | ICD-10-CM | POA: Diagnosis not present

## 2019-12-01 DIAGNOSIS — E7800 Pure hypercholesterolemia, unspecified: Secondary | ICD-10-CM | POA: Diagnosis not present

## 2019-12-01 DIAGNOSIS — I4891 Unspecified atrial fibrillation: Secondary | ICD-10-CM | POA: Diagnosis not present

## 2019-12-13 DIAGNOSIS — Z09 Encounter for follow-up examination after completed treatment for conditions other than malignant neoplasm: Secondary | ICD-10-CM | POA: Insufficient documentation

## 2019-12-13 HISTORY — DX: Encounter for follow-up examination after completed treatment for conditions other than malignant neoplasm: Z09

## 2020-01-19 DIAGNOSIS — I10 Essential (primary) hypertension: Secondary | ICD-10-CM | POA: Diagnosis not present

## 2020-01-19 DIAGNOSIS — H6120 Impacted cerumen, unspecified ear: Secondary | ICD-10-CM | POA: Diagnosis not present

## 2020-01-19 DIAGNOSIS — E119 Type 2 diabetes mellitus without complications: Secondary | ICD-10-CM | POA: Diagnosis not present

## 2020-02-24 ENCOUNTER — Other Ambulatory Visit: Payer: Self-pay

## 2020-02-24 ENCOUNTER — Encounter: Payer: Self-pay | Admitting: Cardiology

## 2020-02-24 ENCOUNTER — Ambulatory Visit (INDEPENDENT_AMBULATORY_CARE_PROVIDER_SITE_OTHER): Payer: Medicare Other | Admitting: Cardiology

## 2020-02-24 VITALS — BP 148/68 | HR 63 | Ht 69.0 in | Wt 210.2 lb

## 2020-02-24 DIAGNOSIS — I10 Essential (primary) hypertension: Secondary | ICD-10-CM | POA: Diagnosis not present

## 2020-02-24 DIAGNOSIS — I4821 Permanent atrial fibrillation: Secondary | ICD-10-CM | POA: Diagnosis not present

## 2020-02-24 DIAGNOSIS — E119 Type 2 diabetes mellitus without complications: Secondary | ICD-10-CM | POA: Diagnosis not present

## 2020-02-24 DIAGNOSIS — I25118 Atherosclerotic heart disease of native coronary artery with other forms of angina pectoris: Secondary | ICD-10-CM | POA: Diagnosis not present

## 2020-02-24 DIAGNOSIS — Z951 Presence of aortocoronary bypass graft: Secondary | ICD-10-CM | POA: Diagnosis not present

## 2020-02-24 NOTE — Patient Instructions (Signed)

## 2020-02-24 NOTE — Progress Notes (Signed)
Cardiology Office Note:    Date:  02/24/2020   ID:  Todd Sampson, DOB 1935-02-10, MRN [id number]  PCP:  Townsend Roger, MD  Cardiologist:  Jenean Lindau, MD   Referring MD: Townsend Roger, MD    ASSESSMENT:    1. Coronary artery disease of native artery of native heart with stable angina pectoris ([place])   2. Essential hypertension   3. Permanent atrial fibrillation ([place])   4. Diet-controlled diabetes mellitus ([place])   5. Hx of CABG    PLAN:    In order of problems listed above:  1. Coronary artery disease: Secondary prevention stressed with the patient.  Importance of compliance with diet medication stressed and he vocalized understanding.  His effort tolerance is age-appropriate and he will continue to do so. 2. Essential hypertension: Blood pressure stable and diet was emphasized 3. Mixed dyslipidemia and diabetes mellitus: Diet emphasized.  Patient's effort tolerance is good and in the next few weeks he will have complete blood work by primary care physician and he promises to send me a copy. 4. Permanent atrial fibrillation:I discussed with the patient atrial fibrillation, disease process. Management and therapy including rate and rhythm control, anticoagulation benefits and potential risks were discussed extensively with the patient. Patient had multiple questions which were answered to patient's satisfaction. 5. Patient will be seen in follow-up appointment in 6 months or earlier if the patient has any concerns    Medication Adjustments/Labs and Tests Ordered: Current medicines are reviewed at length with the patient today.  Concerns regarding medicines are outlined above.  No orders of the defined types were placed in this encounter.  No orders of the defined types were placed in this encounter.    No chief complaint on file.    History of Present Illness:    Todd Sampson is a 85 y.o. male.  Patient has past medical history of coronary artery disease, essential  hypertension dyslipidemia and diabetes mellitus.  He has permanent atrial fibrillation and is tolerating anticoagulation well.  He denies any chest pain orthopnea or PND.  He recently had a cholecystectomy.  At the time of my evaluation, the patient is alert awake oriented and in no distress.  He walks more than a mile on a regular basis.  Past Medical History:  Diagnosis Date  . Anemia   . Barrett's esophagus   . Diabetes mellitus   . Guillain-Barre disease ([place])   . H/O: hemorrhoidectomy   . Heart disease   . Hyperlipidemia   . Hypertension   . Prostate cancer ([place])   . Salivary gland calculi     Past Surgical History:  Procedure Laterality Date  . APPENDECTOMY    . CARDIAC BYPASS    . REPLACEMENT TOTAL KNEE BILATERAL      Current Medications: Current Meds  Medication Sig  . amLODipine (NORVASC) 10 MG tablet Take 10 mg by mouth daily.    [name] apixaban (ELIQUIS) 5 MG TABS tablet Take 1 tablet (5 mg total) by mouth 2 (two) times daily.  . carvedilol (COREG) 3.125 MG tablet Take 3.125 tablets by mouth daily.  . finasteride (PROSCAR) 5 MG tablet Take 5 mg by mouth daily.   . folic acid (FOLVITE) 1 MG tablet Take 1 mg by mouth daily.    . isosorbide mononitrate (IMDUR) 60 MG 24 hr tablet Take 60 mg by mouth daily.   [name] lisinopril (ZESTRIL) 40 MG tablet Take 40 mg by mouth daily.  [name] lovastatin (MEVACOR)  20 MG tablet Take 1 tablet (20 mg total) by mouth daily at 6 PM.  . omega-3 acid ethyl esters (LOVAZA) 1 g capsule Take 500 mg by mouth daily.  . Omega-3 Fatty Acids (FISH OIL) 1000 MG CAPS Take by mouth.  . ondansetron (ZOFRAN-ODT) 4 MG disintegrating tablet Take 4 mg by mouth every 6 (six) hours as needed.  . pantoprazole (PROTONIX) 40 MG tablet Take 40 mg by mouth daily.  [name] thiamine 100 MG tablet Take 100 mg by mouth daily.     Allergies:   Penicillins   Social History   Socioeconomic History  . Marital status: Married    Spouse name: Not on file  . Number of children: Not  on file  . Years of education: Not on file  . Highest education level: Not on file  Occupational History  . Not on file  Tobacco Use  . Smoking status: Former Research scientist (life sciences)  . Smokeless tobacco: Never Used  Vaping Use  . Vaping Use: Never used  Substance and Sexual Activity  . Alcohol use: No  . Drug use: No  . Sexual activity: Not on file  Other Topics Concern  . Not on file  Social History Narrative  . Not on file   Social Determinants of Health   Financial Resource Strain:   . Difficulty of Paying Living Expenses: Not on file  Food Insecurity:   . Worried About Charity fundraiser in the Last Year: Not on file  . Ran Out of Food in the Last Year: Not on file  Transportation Needs:   . Lack of Transportation (Medical): Not on file  . Lack of Transportation (Non-Medical): Not on file  Physical Activity:   . Days of Exercise per Week: Not on file  . Minutes of Exercise per Session: Not on file  Stress:   . Feeling of Stress : Not on file  Social Connections:   . Frequency of Communication with Friends and Family: Not on file  . Frequency of Social Gatherings with Friends and Family: Not on file  . Attends Religious Services: Not on file  . Active Member of Clubs or Organizations: Not on file  . Attends Archivist Meetings: Not on file  . Marital Status: Not on file     Family History: The patient's family history includes Cancer in his brother and mother; Heart disease in his brother, father, and sister.  ROS:   Please see the history of present illness.    All other systems reviewed and are negative.  EKGs/Labs/Other Studies Reviewed:    The following studies were reviewed today: EKG reveals atrial fibrillation with well-controlled ventricular rate and was done in the past   Recent Labs: 08/05/2019: ALT 18; Hemoglobin 15.3; Platelets 174; TSH 2.220 08/09/2019: BUN 15; Creatinine, Ser 1.06; Potassium 4.7; Sodium 142  Recent Lipid Panel    Component Value  Date/Time   CHOL 102 08/05/2019 0818   TRIG 60 08/05/2019 0818   HDL 30 (L) 08/05/2019 0818   CHOLHDL 3.4 08/05/2019 0818   LDLCALC 58 08/05/2019 0818    Physical Exam:    VS:  BP (!) 148/68   Pulse 63   Ht 5\' 9"  (1.753 m)   Wt 210 lb 3.2 oz (95.3 kg)   SpO2 97%   BMI 31.04 kg/m     Wt Readings from Last 3 Encounters:  02/24/20 210 lb 3.2 oz (95.3 kg)  08/09/19 212 lb 2 oz (96.2 kg)  07/26/19 214  lb (97.1 kg)     GEN: Patient is in no acute distress HEENT: Normal NECK: No JVD; No carotid bruits LYMPHATICS: No lymphadenopathy CARDIAC: Hear sounds regular, 2/6 systolic murmur at the apex. RESPIRATORY:  Clear to auscultation without rales, wheezing or rhonchi  ABDOMEN: Soft, non-tender, non-distended MUSCULOSKELETAL:  No edema; No deformity  SKIN: Warm and dry NEUROLOGIC:  Alert and oriented x 3 PSYCHIATRIC:  Normal affect   Signed, Jenean Lindau, MD  02/24/2020 10:55 AM    [place]

## 2020-03-09 DIAGNOSIS — R05 Cough: Secondary | ICD-10-CM | POA: Diagnosis not present

## 2020-03-09 DIAGNOSIS — U071 COVID-19: Secondary | ICD-10-CM | POA: Diagnosis not present

## 2020-03-10 DIAGNOSIS — U071 COVID-19: Secondary | ICD-10-CM | POA: Diagnosis not present

## 2020-03-30 DIAGNOSIS — R351 Nocturia: Secondary | ICD-10-CM | POA: Diagnosis not present

## 2020-03-30 DIAGNOSIS — C61 Malignant neoplasm of prostate: Secondary | ICD-10-CM | POA: Diagnosis not present

## 2020-03-30 DIAGNOSIS — N50819 Testicular pain, unspecified: Secondary | ICD-10-CM | POA: Diagnosis not present

## 2020-03-30 DIAGNOSIS — N5089 Other specified disorders of the male genital organs: Secondary | ICD-10-CM | POA: Diagnosis not present

## 2020-03-31 DIAGNOSIS — M25422 Effusion, left elbow: Secondary | ICD-10-CM | POA: Diagnosis not present

## 2020-03-31 DIAGNOSIS — M25522 Pain in left elbow: Secondary | ICD-10-CM | POA: Diagnosis not present

## 2020-04-03 DIAGNOSIS — M7989 Other specified soft tissue disorders: Secondary | ICD-10-CM | POA: Diagnosis not present

## 2020-04-03 DIAGNOSIS — M25422 Effusion, left elbow: Secondary | ICD-10-CM | POA: Diagnosis not present

## 2020-04-03 DIAGNOSIS — M19022 Primary osteoarthritis, left elbow: Secondary | ICD-10-CM | POA: Diagnosis not present

## 2020-04-03 DIAGNOSIS — M25522 Pain in left elbow: Secondary | ICD-10-CM | POA: Diagnosis not present

## 2020-04-11 DIAGNOSIS — N433 Hydrocele, unspecified: Secondary | ICD-10-CM | POA: Diagnosis not present

## 2020-04-11 DIAGNOSIS — C61 Malignant neoplasm of prostate: Secondary | ICD-10-CM | POA: Diagnosis not present

## 2020-04-11 DIAGNOSIS — N50819 Testicular pain, unspecified: Secondary | ICD-10-CM | POA: Diagnosis not present

## 2020-04-11 DIAGNOSIS — M1611 Unilateral primary osteoarthritis, right hip: Secondary | ICD-10-CM | POA: Diagnosis not present

## 2020-04-11 DIAGNOSIS — N281 Cyst of kidney, acquired: Secondary | ICD-10-CM | POA: Diagnosis not present

## 2020-04-11 DIAGNOSIS — M4856XA Collapsed vertebra, not elsewhere classified, lumbar region, initial encounter for fracture: Secondary | ICD-10-CM | POA: Diagnosis not present

## 2020-04-11 DIAGNOSIS — I700 Atherosclerosis of aorta: Secondary | ICD-10-CM | POA: Diagnosis not present

## 2020-04-11 DIAGNOSIS — I861 Scrotal varices: Secondary | ICD-10-CM | POA: Diagnosis not present

## 2020-04-11 DIAGNOSIS — N5089 Other specified disorders of the male genital organs: Secondary | ICD-10-CM | POA: Diagnosis not present

## 2020-04-24 DIAGNOSIS — E1121 Type 2 diabetes mellitus with diabetic nephropathy: Secondary | ICD-10-CM | POA: Diagnosis not present

## 2020-04-24 DIAGNOSIS — Z6829 Body mass index (BMI) 29.0-29.9, adult: Secondary | ICD-10-CM | POA: Diagnosis not present

## 2020-04-24 DIAGNOSIS — J45909 Unspecified asthma, uncomplicated: Secondary | ICD-10-CM | POA: Diagnosis not present

## 2020-04-24 DIAGNOSIS — I10 Essential (primary) hypertension: Secondary | ICD-10-CM | POA: Diagnosis not present

## 2020-04-24 DIAGNOSIS — E7800 Pure hypercholesterolemia, unspecified: Secondary | ICD-10-CM | POA: Diagnosis not present

## 2020-04-24 DIAGNOSIS — Z1389 Encounter for screening for other disorder: Secondary | ICD-10-CM | POA: Diagnosis not present

## 2020-04-24 DIAGNOSIS — Z0000 Encounter for general adult medical examination without abnormal findings: Secondary | ICD-10-CM | POA: Diagnosis not present

## 2020-04-24 DIAGNOSIS — Z23 Encounter for immunization: Secondary | ICD-10-CM | POA: Diagnosis not present

## 2020-04-24 DIAGNOSIS — I25810 Atherosclerosis of coronary artery bypass graft(s) without angina pectoris: Secondary | ICD-10-CM | POA: Diagnosis not present

## 2020-04-24 DIAGNOSIS — K2270 Barrett's esophagus without dysplasia: Secondary | ICD-10-CM | POA: Diagnosis not present

## 2020-04-24 DIAGNOSIS — I4891 Unspecified atrial fibrillation: Secondary | ICD-10-CM | POA: Diagnosis not present

## 2020-04-24 DIAGNOSIS — M810 Age-related osteoporosis without current pathological fracture: Secondary | ICD-10-CM | POA: Diagnosis not present

## 2020-05-17 DIAGNOSIS — E119 Type 2 diabetes mellitus without complications: Secondary | ICD-10-CM | POA: Diagnosis not present

## 2020-05-17 DIAGNOSIS — Z961 Presence of intraocular lens: Secondary | ICD-10-CM | POA: Diagnosis not present

## 2020-06-13 DIAGNOSIS — Z471 Aftercare following joint replacement surgery: Secondary | ICD-10-CM | POA: Diagnosis not present

## 2020-06-13 DIAGNOSIS — Z96642 Presence of left artificial hip joint: Secondary | ICD-10-CM | POA: Diagnosis not present

## 2020-06-14 DIAGNOSIS — Z23 Encounter for immunization: Secondary | ICD-10-CM | POA: Diagnosis not present

## 2020-06-28 ENCOUNTER — Telehealth: Payer: Self-pay | Admitting: Cardiology

## 2020-06-28 ENCOUNTER — Emergency Department (HOSPITAL_COMMUNITY)
Admission: EM | Admit: 2020-06-28 | Discharge: 2020-06-29 | Disposition: A | Discharge status: Home / Self Care | Payer: Medicare Other | Attending: Emergency Medicine | Admitting: Emergency Medicine

## 2020-06-28 ENCOUNTER — Encounter: Payer: Self-pay | Admitting: Cardiology

## 2020-06-28 ENCOUNTER — Ambulatory Visit (INDEPENDENT_AMBULATORY_CARE_PROVIDER_SITE_OTHER): Payer: Medicare Other | Admitting: Cardiology

## 2020-06-28 ENCOUNTER — Other Ambulatory Visit: Payer: Self-pay

## 2020-06-28 ENCOUNTER — Emergency Department (HOSPITAL_COMMUNITY): Payer: Medicare Other

## 2020-06-28 ENCOUNTER — Encounter (HOSPITAL_COMMUNITY): Payer: Self-pay | Admitting: Emergency Medicine

## 2020-06-28 VITALS — BP 134/82 | HR 61 | Ht 69.0 in | Wt 217.0 lb

## 2020-06-28 DIAGNOSIS — Z955 Presence of coronary angioplasty implant and graft: Secondary | ICD-10-CM | POA: Insufficient documentation

## 2020-06-28 DIAGNOSIS — M79602 Pain in left arm: Secondary | ICD-10-CM | POA: Diagnosis not present

## 2020-06-28 DIAGNOSIS — R0789 Other chest pain: Secondary | ICD-10-CM | POA: Diagnosis not present

## 2020-06-28 DIAGNOSIS — I10 Essential (primary) hypertension: Secondary | ICD-10-CM | POA: Diagnosis not present

## 2020-06-28 DIAGNOSIS — I25110 Atherosclerotic heart disease of native coronary artery with unstable angina pectoris: Secondary | ICD-10-CM | POA: Insufficient documentation

## 2020-06-28 DIAGNOSIS — E119 Type 2 diabetes mellitus without complications: Secondary | ICD-10-CM

## 2020-06-28 DIAGNOSIS — E785 Hyperlipidemia, unspecified: Secondary | ICD-10-CM | POA: Diagnosis not present

## 2020-06-28 DIAGNOSIS — Z87891 Personal history of nicotine dependence: Secondary | ICD-10-CM | POA: Diagnosis not present

## 2020-06-28 DIAGNOSIS — R0602 Shortness of breath: Secondary | ICD-10-CM | POA: Insufficient documentation

## 2020-06-28 DIAGNOSIS — R079 Chest pain, unspecified: Secondary | ICD-10-CM | POA: Diagnosis not present

## 2020-06-28 DIAGNOSIS — Z8546 Personal history of malignant neoplasm of prostate: Secondary | ICD-10-CM | POA: Insufficient documentation

## 2020-06-28 DIAGNOSIS — Z7901 Long term (current) use of anticoagulants: Secondary | ICD-10-CM | POA: Diagnosis not present

## 2020-06-28 DIAGNOSIS — I340 Nonrheumatic mitral (valve) insufficiency: Secondary | ICD-10-CM

## 2020-06-28 DIAGNOSIS — Z951 Presence of aortocoronary bypass graft: Secondary | ICD-10-CM

## 2020-06-28 DIAGNOSIS — I4821 Permanent atrial fibrillation: Secondary | ICD-10-CM | POA: Diagnosis not present

## 2020-06-28 DIAGNOSIS — I4891 Unspecified atrial fibrillation: Secondary | ICD-10-CM | POA: Diagnosis not present

## 2020-06-28 DIAGNOSIS — Z96653 Presence of artificial knee joint, bilateral: Secondary | ICD-10-CM | POA: Diagnosis not present

## 2020-06-28 DIAGNOSIS — Z79899 Other long term (current) drug therapy: Secondary | ICD-10-CM | POA: Insufficient documentation

## 2020-06-28 DIAGNOSIS — I25118 Atherosclerotic heart disease of native coronary artery with other forms of angina pectoris: Secondary | ICD-10-CM | POA: Diagnosis not present

## 2020-06-28 DIAGNOSIS — J439 Emphysema, unspecified: Secondary | ICD-10-CM | POA: Diagnosis not present

## 2020-06-28 IMAGING — CR DG CHEST 2V
2 series · 2 of 2 positions shown · non-contrast
Comparison: 10/21/2019

CLINICAL DATA: Chest pain and shortness of breath since yesterday.

EXAM:
CHEST - 2 VIEW

[chest pa]
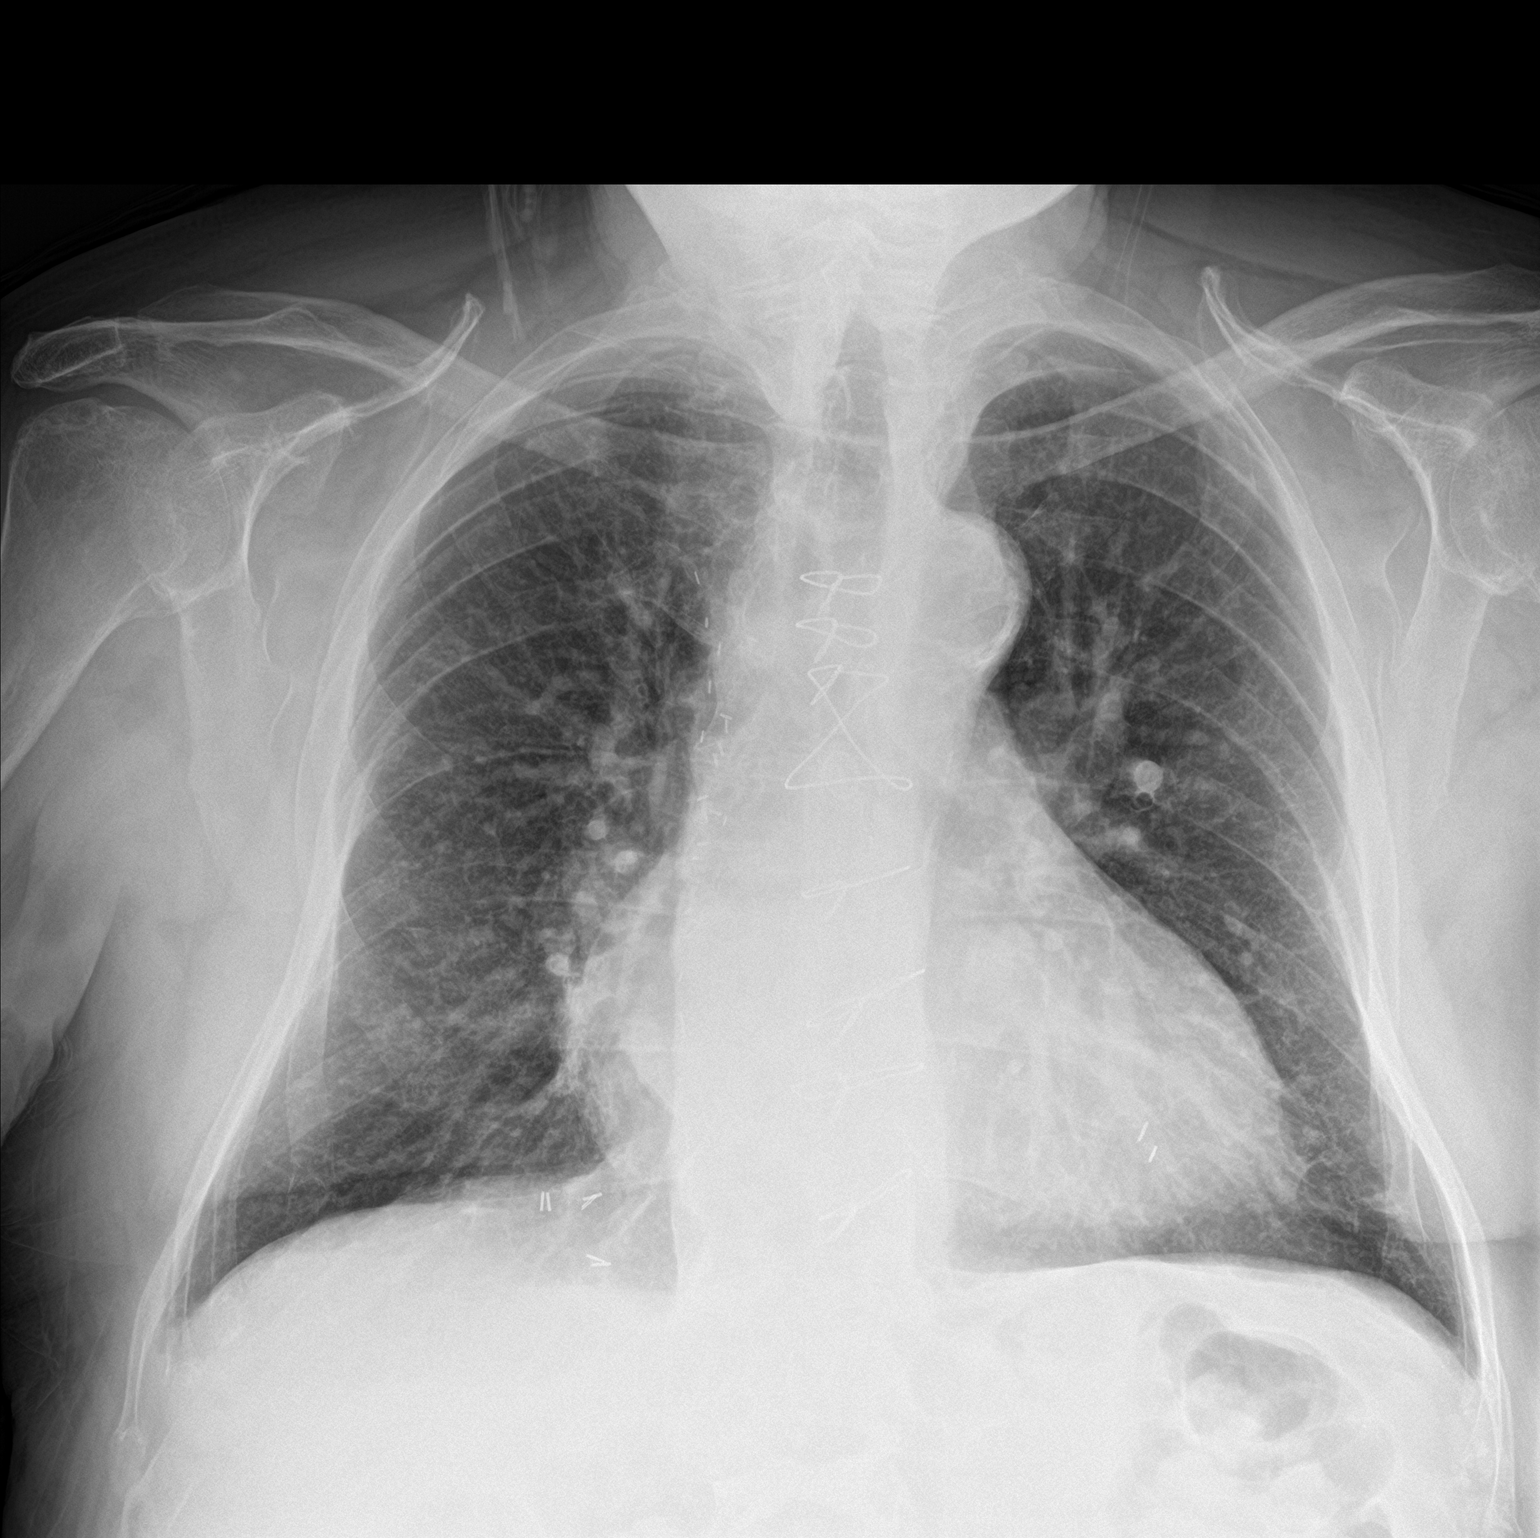

[chest lat]
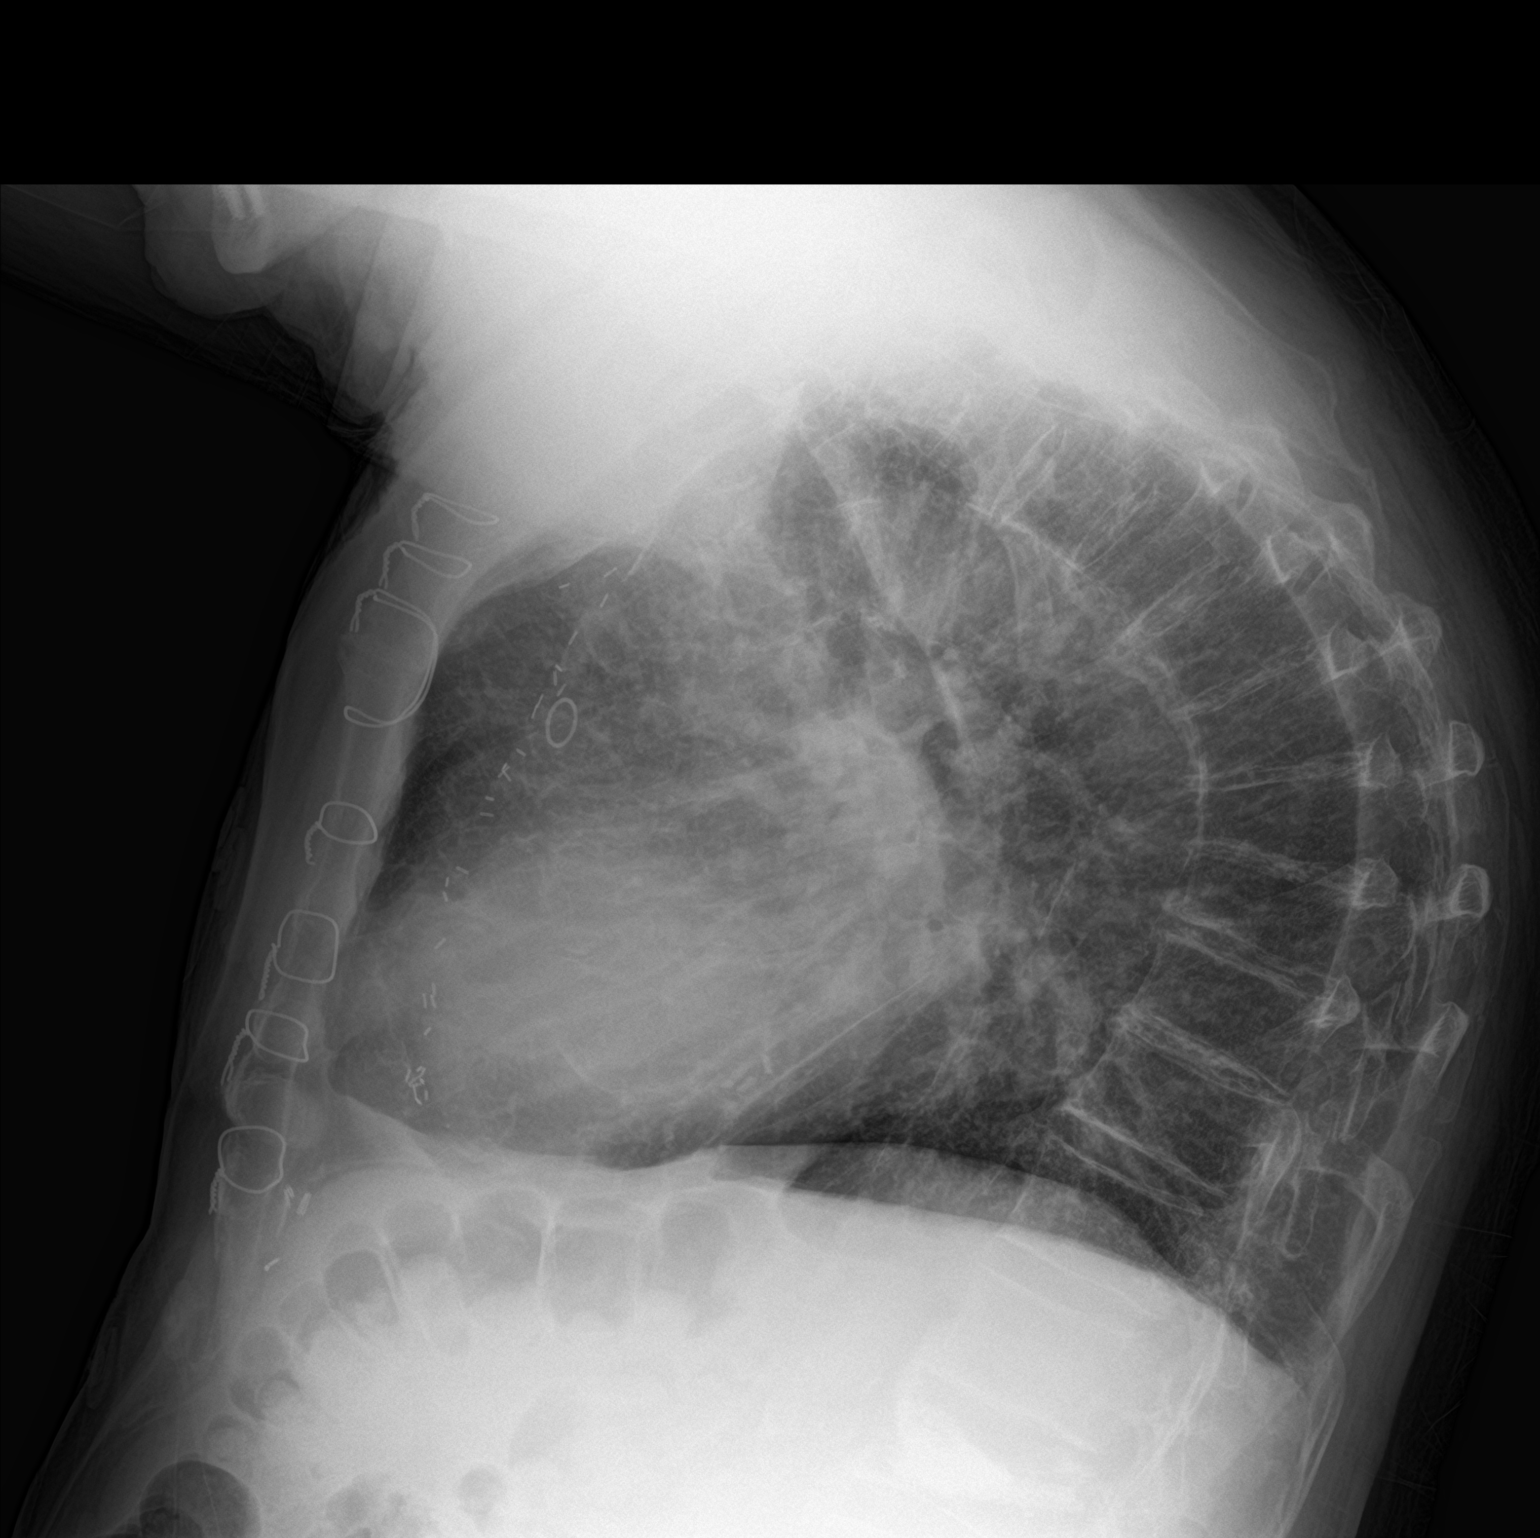

[2 of 2 positions shown; findings below may reference images not displayed]

FINDINGS: Postoperative changes in the mediastinum. Cardiac enlargement with
mild vascular congestion. No edema or consolidation. No pleural
effusions. No pneumothorax. Emphysematous changes in the lungs.
Calcification of the aorta. Degenerative changes in the spine.
Previous lumbar vertebral kyphoplasty.
IMPRESSION: Cardiac enlargement with mild vascular congestion. No edema or
consolidation.

## 2020-06-28 MED ORDER — NITROGLYCERIN 0.4 MG SL SUBL: 0.4000 mg | SUBLINGUAL_TABLET | SUBLINGUAL | 11 refills | Status: DC | PRN

## 2020-06-28 MED ORDER — ISOSORBIDE MONONITRATE ER 120 MG PO TB24
120.0000 mg | ORAL_TABLET | Freq: Every day | ORAL | 3 refills | Status: DC
Start: 2020-06-28 — End: 2021-07-04

## 2020-06-28 NOTE — Telephone Encounter (Signed)
Pt c/o Shortness Of Breath: STAT if SOB developed within the last 24 hours or pt is noticeably SOB on the phone  1. Are you currently SOB (can you hear that pt is SOB on the phone)? No.  2. How long have you been experiencing SOB? Yesterday.  3. Are you SOB when sitting or when up moving around? With exertion.  4. Are you currently experiencing any other symptoms? Patient states that his arm and shoulder hurts when he is having SOB. Please advise.

## 2020-06-28 NOTE — Patient Instructions (Signed)
Medication Instructions:  Your physician has recommended you make the following change in your medication:   Stop Lisinopril. Increase your Isosorbide to 120 mg daily. Take Nitroglycerin as needed for chest pain.  *If you need a refill on your cardiac medications before your next appointment, please call your pharmacy*   Lab Work: Your physician recommends that you have labs done in the office today. Your test included  basic metabolic panel and stat troponin.  If you have labs (blood work) drawn today and your tests are completely normal, you will receive your results only by:  MyChart Message (if you have MyChart) OR  A paper copy in the mail If you have any lab test that is abnormal or we need to change your treatment, we will call you to review the results.   Testing/Procedures: None ordered   Follow-Up: At [facility], you and your health needs are our priority.  As part of our continuing mission to provide you with exceptional heart care, we have created designated Provider Care Teams.  These Care Teams include your primary Cardiologist (physician) and Advanced Practice Providers (APPs -  Physician Assistants and Nurse Practitioners) who all work together to provide you with the care you need, when you need it.  We recommend signing up for the patient portal called "MyChart".  Sign up information is provided on this After Visit Summary.  MyChart is used to connect with patients for Virtual Visits (Telemedicine).  Patients are able to view lab/test results, encounter notes, upcoming appointments, etc.  Non-urgent messages can be sent to your provider as well.   To learn more about what you can do with MyChart, go to [email or web address].    Your next appointment:   1 week(s)  The format for your next appointment:   In Person  Provider:   Belva Crome, MD   Other Instructions NA

## 2020-06-28 NOTE — Progress Notes (Addendum)
Cardiology Office Note:    Date:  06/28/2020   ID:  Todd Sampson, DOB 06-15-1935, MRN [id number]  PCP:  Crist Fat, MD  Cardiologist:  Garwin Brothers, MD   Referring MD: Crist Fat, MD    ASSESSMENT:    1. Permanent atrial fibrillation (HCC)   2. Mitral valve insufficiency, unspecified etiology   3. Essential hypertension   4. Coronary artery disease of native artery of native heart with stable angina pectoris (HCC)   5. Hx of CABG   6. Dyslipidemia (high LDL; low HDL)   7. Diet-controlled diabetes mellitus (HCC)    PLAN:    In order of problems listed above:  1. Chest pain: Coronary artery disease: CABG surgery in the remote past: I discussed my findings with the patient at length.  He is symptoms are atypical.  However they are of some concern.  He does not have nitroglycerin with him.  I sent a prescription and its use was detailed to him.  He vocalized understanding.  In view of his symptoms I will need to Chem-7 and troponin as stat today.  Further recommendations will be made based on the findings of the test.  EKG reveals atrial fibrillation with well-controlled ventricular rate.  No significant changes to suggest ischemia.  Again patient is very comfortable and cheerful at the time of my evaluation.  To evaluate this further we will do a Lexiscan sestamibi electively.  Also in view of this I stopped his lisinopril temporarily and doubled his isosorbide so that if there is any issues with angina it will help him.  I will reinstate him on lisinopril at some point in the future being the fact that he is a diabetic.  I discussed this with him at length and questions were answered to his satisfaction.  He knows to go to the nearest emergency room for any concerning symptoms. 2. Permanent atrial fibrillation:I discussed with the patient atrial fibrillation, disease process. Management and therapy including rate and rhythm control, anticoagulation benefits and potential risks  were discussed extensively with the patient. Patient had multiple questions which were answered to patient's satisfaction. 3. Essential hypertension: Blood pressure stable and diet was emphasized. 4. Mixed dyslipidemia and diabetes mellitus: I discussed my findings with the patient in extensive length and diet was emphasized and weight reduction was stressed.  Risks of obesity explained and he vocalized understanding.  Addendum: I got a call from [facility] that the Troponin value is 26 (Upper Level of normal is 22). I called the pt. He is completely asymptomatic. I told him to have a family member take him to the nearest Er or better still go to hospital via ambulance. He agrees and wants to go only to [facility]. I told him my preference is nearest ED. I have called ED doc at [facility] and alerted them of this scenario.   Medication Adjustments/Labs and Tests Ordered: Current medicines are reviewed at length with the patient today.  Concerns regarding medicines are outlined above.  No orders of the defined types were placed in this encounter.  No orders of the defined types were placed in this encounter.    No chief complaint on file.    History of Present Illness:    Todd Sampson is a 85 y.o. male.  Patient has past medical history of coronary artery disease post CABG surgery, essential hypertension dyslipidemia diabetes mellitus.  He mentions to me that he had an episode of chest tightness yesterday.  He  was walking uphill.  No orthopnea or PND.  Subsequently felt better.  He also had an episode of chest tightness this morning.  This was not related to exertion.  Since then he has done fine.  At the time of my evaluation he is alert awake oriented and in no distress.  He has persistent atrial fibrillation.  Past Medical History:  Diagnosis Date  . Anemia   . Barrett's esophagus   . Diabetes mellitus   . Guillain-Barre disease (HCC)   . H/O: hemorrhoidectomy   . Heart disease   . Hyperlipidemia    . Hypertension   . Prostate cancer (HCC)   . Salivary gland calculi     Past Surgical History:  Procedure Laterality Date  . APPENDECTOMY    . CARDIAC BYPASS    . REPLACEMENT TOTAL KNEE BILATERAL      Current Medications: Current Meds  Medication Sig  . amLODipine (NORVASC) 10 MG tablet Take 10 mg by mouth daily.  [name] apixaban (ELIQUIS) 5 MG TABS tablet Take 1 tablet (5 mg total) by mouth 2 (two) times daily.  . finasteride (PROSCAR) 5 MG tablet Take 5 mg by mouth daily.   . folic acid (FOLVITE) 1 MG tablet Take 1 mg by mouth daily.  . isosorbide mononitrate (IMDUR) 60 MG 24 hr tablet Take 60 mg by mouth daily.  [name] lisinopril (ZESTRIL) 40 MG tablet Take 40 mg by mouth daily.  [name] lovastatin (MEVACOR) 20 MG tablet Take 1 tablet (20 mg total) by mouth daily at 6 PM.  . nitroGLYCERIN (NITROSTAT) 0.4 MG SL tablet Place 1 tablet (0.4 mg total) under the tongue every 5 (five) minutes as needed for chest pain.  [name] omega-3 acid ethyl esters (LOVAZA) 1 g capsule Take 500 mg by mouth daily.  . Omega-3 Fatty Acids (FISH OIL) 1000 MG CAPS Take by mouth.  . ondansetron (ZOFRAN-ODT) 4 MG disintegrating tablet Take 4 mg by mouth every 6 (six) hours as needed.  . pantoprazole (PROTONIX) 40 MG tablet Take 40 mg by mouth daily.  [name] thiamine 100 MG tablet Take 100 mg by mouth daily.  . [DISCONTINUED] carvedilol (COREG) 3.125 MG tablet Take 3.125 tablets by mouth daily.     Allergies:   Penicillins   Social History   Socioeconomic History  . Marital status: Married    Spouse name: Not on file  . Number of children: Not on file  . Years of education: Not on file  . Highest education level: Not on file  Occupational History  . Not on file  Tobacco Use  . Smoking status: Former Games developer  . Smokeless tobacco: Never Used  Vaping Use  . Vaping Use: Never used  Substance and Sexual Activity  . Alcohol use: No  . Drug use: No  . Sexual activity: Not on file  Other Topics Concern  . Not on file   Social History Narrative  . Not on file   Social Determinants of Health   Financial Resource Strain: Not on file  Food Insecurity: Not on file  Transportation Needs: Not on file  Physical Activity: Not on file  Stress: Not on file  Social Connections: Not on file     Family History: The patient's family history includes Cancer in his brother and mother; Heart disease in his brother, father, and sister.  ROS:   Please see the history of present illness.    All other systems reviewed and are negative.  EKGs/Labs/Other Studies Reviewed:  The following studies were reviewed today: EKG reveals atrial fibrillation with well-controlled ventricular rate.   Recent Labs: 08/05/2019: ALT 18; Hemoglobin 15.3; Platelets 174; TSH 2.220 08/09/2019: BUN 15; Creatinine, Ser 1.06; Potassium 4.7; Sodium 142  Recent Lipid Panel    Component Value Date/Time   CHOL 102 08/05/2019 0818   TRIG 60 08/05/2019 0818   HDL 30 (L) 08/05/2019 0818   CHOLHDL 3.4 08/05/2019 0818   LDLCALC 58 08/05/2019 0818    Physical Exam:    VS:  BP 134/82   Pulse 61   Ht 5\' 9"  (1.753 m)   Wt 217 lb (98.4 kg)   SpO2 97%   BMI 32.05 kg/m     Wt Readings from Last 3 Encounters:  06/28/20 217 lb (98.4 kg)  02/24/20 210 lb 3.2 oz (95.3 kg)  08/09/19 212 lb 2 oz (96.2 kg)     GEN: Patient is in no acute distress HEENT: Normal NECK: No JVD; No carotid bruits LYMPHATICS: No lymphadenopathy CARDIAC: Hear sounds irregular, 2/6 systolic murmur at the apex. RESPIRATORY:  Clear to auscultation without rales, wheezing or rhonchi  ABDOMEN: Soft, non-tender, non-distended MUSCULOSKELETAL:  No edema; No deformity  SKIN: Warm and dry NEUROLOGIC:  Alert and oriented x 3 PSYCHIATRIC:  Normal affect   Signed, Jenean Lindau, MD  06/28/2020 3:13 PM    [facility]

## 2020-06-28 NOTE — Telephone Encounter (Signed)
Appointment made for today with Dr. Revankar. 

## 2020-06-28 NOTE — Addendum Note (Signed)
Addended by: Eleonore Chiquito on: 06/28/2020 03:28 PM   Modules accepted: Orders

## 2020-06-28 NOTE — ED Triage Notes (Signed)
Patient reports left upper chest pain with SOB and palpitations onset yesterday , advised by his MD to go to ER for evaluation , history of CAD/CABG .

## 2020-06-28 NOTE — Addendum Note (Signed)
Addended by: Eleonore Chiquito on: 06/28/2020 03:38 PM   Modules accepted: Orders

## 2020-06-29 DIAGNOSIS — R079 Chest pain, unspecified: Secondary | ICD-10-CM | POA: Diagnosis not present

## 2020-06-29 LAB — CBC
HCT: 41.2 % (ref 39.0–52.0)
Hemoglobin: 13.6 g/dL (ref 13.0–17.0)
MCH: 30 pg (ref 26.0–34.0)
MCHC: 33 g/dL (ref 30.0–36.0)
MCV: 90.7 fL (ref 80.0–100.0)
Platelets: 167 10*3/uL (ref 150–400)
RBC: 4.54 MIL/uL (ref 4.22–5.81)
RDW: 13.2 % (ref 11.5–15.5)
WBC: 7 10*3/uL (ref 4.0–10.5)
nRBC: 0 % (ref 0.0–0.2)

## 2020-06-29 LAB — BASIC METABOLIC PANEL
Anion gap: 9 (ref 5–15)
BUN/Creatinine Ratio: 16 (ref 10–24)
BUN: 17 mg/dL (ref 8–23)
BUN: 18 mg/dL (ref 8–27)
CO2: 24 mmol/L (ref 22–32)
CO2: 25 mmol/L (ref 20–29)
Calcium: 9.1 mg/dL (ref 8.6–10.2)
Calcium: 9.3 mg/dL (ref 8.9–10.3)
Chloride: 103 mmol/L (ref 96–106)
Chloride: 105 mmol/L (ref 98–111)
Creatinine, Ser: 1.15 mg/dL (ref 0.76–1.27)
Creatinine, Ser: 1.2 mg/dL (ref 0.61–1.24)
GFR calc Af Amer: 68 mL/min/{1.73_m2} (ref >59)
GFR calc non Af Amer: 59 mL/min/{1.73_m2} — ABNORMAL LOW (ref >59)
GFR, Estimated: >60 mL/min (ref >60)
Glucose, Bld: 146 mg/dL — ABNORMAL HIGH (ref 70–99)
Glucose: 139 mg/dL — ABNORMAL HIGH (ref 65–99)
Potassium: 3.8 mmol/L (ref 3.5–5.1)
Potassium: 4.7 mmol/L (ref 3.5–5.2)
Sodium: 138 mmol/L (ref 135–145)
Sodium: 139 mmol/L (ref 134–144)

## 2020-06-29 LAB — TROPONIN I (HIGH SENSITIVITY)
Troponin I (High Sensitivity): 16 ng/L (ref <18)
Troponin I (High Sensitivity): 17 ng/L (ref <18)

## 2020-06-29 LAB — PROTIME-INR
INR: 1.2 (ref 0.8–1.2)
Prothrombin Time: 14.9 seconds (ref 11.4–15.2)

## 2020-06-29 LAB — TROPONIN T: Troponin T (Highly Sensitive): 26 ng/L (ref 0–22)

## 2020-06-29 NOTE — ED Provider Notes (Signed)
[place] EMERGENCY DEPARTMENT Provider Note   CSN: [id number] Arrival date & time: 06/28/20  2255     History Chief Complaint  Patient presents with  . Chest Pain    Palpitations    Todd Sampson is a 85 y.o. male.  Patient presents to the emergency department for evaluation of chest pain.  Patient had left upper chest and left arm pain 2 days ago when walking.  Patient reports that he walks about 1 mile every day.  He has not had exertional chest pain prior to this.  He reports that he had to stop walking, was very short of breath.  He noted rapid heart rate.  He rested for about 10 minutes and symptoms improved and then he slowly finished his walk.  Patient saw his cardiologist earlier today.  He had blood work performed.  He was called at home by his cardiologist who told him that his troponin was elevated and he should go to the emergency department.        Past Medical History:  Diagnosis Date  . Anemia   . Barrett's esophagus   . Diabetes mellitus   . Guillain-Barre disease ([place])   . H/O: hemorrhoidectomy   . Heart disease   . Hyperlipidemia   . Hypertension   . Prostate cancer ([place])   . Salivary gland calculi     Patient Active Problem List   Diagnosis Date Noted  . Postoperative examination 12/13/2019  . BMI 32.0-32.9,adult 11/09/2019  . Calculus of gallbladder with chronic cholecystitis without obstruction 11/09/2019  . Non-intractable vomiting with nausea 11/09/2019  . Special screening examination for viral disease 11/09/2019  . Umbilical hernia without obstruction and without gangrene 11/09/2019  . Toe pain, bilateral 09/10/2019  . Mitral regurgitation 07/26/2019  . Primary osteoarthritis of left hip 04/20/2018  . Pre-operative cardiovascular examination 10/02/2017  . Hx of CABG 10/02/2017  . Coronary artery disease of native artery of native heart with stable angina pectoris ([place]) 06/06/2016  . Diet-controlled diabetes mellitus  ([place]) 06/06/2016  . Dyslipidemia (high LDL; low HDL) 06/06/2016  . Essential hypertension 06/06/2016  . Permanent atrial fibrillation ([place]) 06/06/2016  . Prostate cancer ([place]) 05/13/2011    Past Surgical History:  Procedure Laterality Date  . APPENDECTOMY    . CARDIAC BYPASS    . REPLACEMENT TOTAL KNEE BILATERAL         Family History  Problem Relation Age of Onset  . Cancer Mother   . Heart disease Father   . Heart disease Sister   . Heart disease Brother   . Cancer Brother     Social History   Tobacco Use  . Smoking status: Former Research scientist (life sciences)  . Smokeless tobacco: Never Used  Vaping Use  . Vaping Use: Never used  Substance Use Topics  . Alcohol use: No  . Drug use: No    Home Medications Prior to Admission medications   Medication Sig Start Date End Date Taking? Authorizing Provider  amLODipine (NORVASC) 10 MG tablet Take 10 mg by mouth daily.    [provider]  apixaban (ELIQUIS) 5 MG TABS tablet Take 1 tablet (5 mg total) by mouth 2 (two) times daily. 09/22/19   Revankar, Reita Cliche, MD  finasteride (PROSCAR) 5 MG tablet Take 5 mg by mouth daily.  05/16/16   [provider]  folic acid (FOLVITE) 1 MG tablet Take 1 mg by mouth daily.    [provider]  isosorbide mononitrate (IMDUR) 120 MG  24 hr tablet Take 1 tablet (120 mg total) by mouth daily. 06/28/20   Revankar, Reita Cliche, MD  lovastatin (MEVACOR) 20 MG tablet Take 1 tablet (20 mg total) by mouth daily at 6 PM. 10/06/17   Revankar, Reita Cliche, MD  nitroGLYCERIN (NITROSTAT) 0.4 MG SL tablet Place 1 tablet (0.4 mg total) under the tongue every 5 (five) minutes as needed for chest pain. 06/28/20 06/28/21  Revankar, Reita Cliche, MD  omega-3 acid ethyl esters (LOVAZA) 1 g capsule Take 500 mg by mouth daily.    [provider]  Omega-3 Fatty Acids (FISH OIL) 1000 MG CAPS Take by mouth.    [provider]  ondansetron (ZOFRAN-ODT) 4 MG disintegrating tablet Take 4 mg by mouth every 6 (six)  hours as needed. 12/01/19   [provider]  pantoprazole (PROTONIX) 40 MG tablet Take 40 mg by mouth daily.    [provider]  thiamine 100 MG tablet Take 100 mg by mouth daily.    [provider]    Allergies    Penicillins  Review of Systems   Review of Systems  Respiratory: Positive for shortness of breath.   Cardiovascular: Positive for chest pain and palpitations.  All other systems reviewed and are negative.   Physical Exam Updated Vital Signs BP (!) 150/74   Pulse (!) 51   Temp 98.1 F (36.7 C) (Oral)   Resp 15   Ht 5\' 9"  (1.753 m)   Wt 98.4 kg   SpO2 93%   BMI 32.05 kg/m   Physical Exam Vitals and nursing note reviewed.  Constitutional:      General: He is not in acute distress.    Appearance: Normal appearance. He is well-developed and well-nourished.  HENT:     Head: Normocephalic and atraumatic.     Right Ear: Hearing normal.     Left Ear: Hearing normal.     Nose: Nose normal.     Mouth/Throat:     Mouth: Oropharynx is clear and moist and mucous membranes are normal.  Eyes:     Extraocular Movements: EOM normal.     Conjunctiva/sclera: Conjunctivae normal.     Pupils: Pupils are equal, round, and reactive to light.  Cardiovascular:     Rate and Rhythm: Bradycardia present. Rhythm irregular.     Heart sounds: S1 normal and S2 normal. No murmur heard. No friction rub. No gallop.   Pulmonary:     Effort: Pulmonary effort is normal. No respiratory distress.     Breath sounds: Normal breath sounds.  Chest:     Chest wall: No tenderness.  Abdominal:     General: Bowel sounds are normal.     Palpations: Abdomen is soft. There is no hepatosplenomegaly.     Tenderness: There is no abdominal tenderness. There is no guarding or rebound. Negative signs include Murphy's sign and McBurney's sign.     Hernia: No hernia is present.  Musculoskeletal:        General: Normal range of motion.     Cervical back: Normal range of motion and  neck supple.  Skin:    General: Skin is warm, dry and intact.     Findings: No rash.     Nails: There is no cyanosis.  Neurological:     Mental Status: He is alert and oriented to person, place, and time.     GCS: GCS eye subscore is 4. GCS verbal subscore is 5. GCS motor subscore is 6.  Cranial Nerves: No cranial nerve deficit.     Sensory: No sensory deficit.     Coordination: Coordination normal.     Deep Tendon Reflexes: Strength normal.  Psychiatric:        Mood and Affect: Mood and affect normal.        Speech: Speech normal.        Behavior: Behavior normal.        Thought Content: Thought content normal.     ED Results / Procedures / Treatments   Labs (all labs ordered are listed, but only abnormal results are displayed) Labs Reviewed  BASIC METABOLIC PANEL - Abnormal; Notable for the following components:      Result Value   Glucose, Bld 146 (*)    All other components within normal limits  CBC  PROTIME-INR  TROPONIN I (HIGH SENSITIVITY)  TROPONIN I (HIGH SENSITIVITY)    EKG EKG Interpretation  Date/Time:  Wednesday June 28 2020 23:28:49 EST Ventricular Rate:  57 PR Interval:    QRS Duration: 130 QT Interval:  458 QTC Calculation: 445 R Axis:   117 Text Interpretation: Atrial fibrillation with slow ventricular response Non-specific intra-ventricular conduction block T wave abnormality, consider inferior ischemia Abnormal ECG Confirmed by Gilda Crease [phone]) on 06/29/2020 4:05:32 AM   Radiology DG Chest 2 View  Result Date: 06/28/2020 CLINICAL DATA:  Chest pain and shortness of breath since yesterday. EXAM: CHEST - 2 VIEW COMPARISON:  10/21/2019 FINDINGS: Postoperative changes in the mediastinum. Cardiac enlargement with mild vascular congestion. No edema or consolidation. No pleural effusions. No pneumothorax. Emphysematous changes in the lungs. Calcification of the aorta. Degenerative changes in the spine. Previous lumbar vertebral  kyphoplasty. IMPRESSION: Cardiac enlargement with mild vascular congestion. No edema or consolidation. Electronically Signed   By: Burman Nieves M.D.   On: 06/28/2020 23:55    Procedures Procedures (including critical care time)  Medications Ordered in ED Medications - No data to display  ED Course  I have reviewed the triage vital signs and the nursing notes.  Pertinent labs & imaging results that were available during my care of the patient were reviewed by me and considered in my medical decision making (see chart for details).    MDM Rules/Calculators/A&P                          Patient presents to the emergency department for evaluation of chest pain and elevated troponin.  Patient had exertional chest and arm pain 2 days ago but has not had any recurrence since.  He saw his cardiologist and had his Imdur increased.  There is a plan to have outpatient stress test noted in the chart.  At that visit earlier today, however, he had a troponin drawn which ultimately came back slightly elevated.  At this point he was told to come to the ER for further evaluation.  His EKG is unremarkable.  Both troponins are negative here.  Patient discussed with Dr. Royann Shivers, on-call for cardiology.  He has reviewed the patient's EKG and labs and feels that the patient is stable for discharge and outpatient stress test.  There is no indication for admission or heart catheterization at this time.  Patient was counseled to return for any worsening symptoms.  Final Clinical Impression(s) / ED Diagnoses Final diagnoses:  Chest pain, unspecified type    Rx / DC Orders ED Discharge Orders    None       Oliverio Cho, Cristal Deer  J, MD 06/29/20 [phone]

## 2020-07-07 DIAGNOSIS — Z9889 Other specified postprocedural states: Secondary | ICD-10-CM | POA: Insufficient documentation

## 2020-07-07 DIAGNOSIS — G610 Guillain-Barre syndrome: Secondary | ICD-10-CM | POA: Insufficient documentation

## 2020-07-07 DIAGNOSIS — I519 Heart disease, unspecified: Secondary | ICD-10-CM | POA: Insufficient documentation

## 2020-07-07 DIAGNOSIS — K115 Sialolithiasis: Secondary | ICD-10-CM | POA: Insufficient documentation

## 2020-07-07 DIAGNOSIS — E785 Hyperlipidemia, unspecified: Secondary | ICD-10-CM | POA: Insufficient documentation

## 2020-07-07 DIAGNOSIS — D649 Anemia, unspecified: Secondary | ICD-10-CM | POA: Insufficient documentation

## 2020-07-07 DIAGNOSIS — I10 Essential (primary) hypertension: Secondary | ICD-10-CM | POA: Insufficient documentation

## 2020-07-07 DIAGNOSIS — K2270 Barrett's esophagus without dysplasia: Secondary | ICD-10-CM | POA: Insufficient documentation

## 2020-07-10 ENCOUNTER — Ambulatory Visit (INDEPENDENT_AMBULATORY_CARE_PROVIDER_SITE_OTHER): Payer: Medicare Other | Admitting: Cardiology

## 2020-07-10 ENCOUNTER — Encounter: Payer: Self-pay | Admitting: Cardiology

## 2020-07-10 ENCOUNTER — Other Ambulatory Visit: Payer: Self-pay

## 2020-07-10 VITALS — BP 134/78 | HR 70 | Ht 69.0 in | Wt 211.6 lb

## 2020-07-10 DIAGNOSIS — I25118 Atherosclerotic heart disease of native coronary artery with other forms of angina pectoris: Secondary | ICD-10-CM | POA: Diagnosis not present

## 2020-07-10 DIAGNOSIS — I10 Essential (primary) hypertension: Secondary | ICD-10-CM | POA: Diagnosis not present

## 2020-07-10 DIAGNOSIS — Z9889 Other specified postprocedural states: Secondary | ICD-10-CM | POA: Diagnosis not present

## 2020-07-10 DIAGNOSIS — R072 Precordial pain: Secondary | ICD-10-CM

## 2020-07-10 DIAGNOSIS — R0789 Other chest pain: Secondary | ICD-10-CM | POA: Diagnosis not present

## 2020-07-10 DIAGNOSIS — E782 Mixed hyperlipidemia: Secondary | ICD-10-CM

## 2020-07-10 DIAGNOSIS — I4821 Permanent atrial fibrillation: Secondary | ICD-10-CM | POA: Diagnosis not present

## 2020-07-10 NOTE — Patient Instructions (Addendum)
Medication Instructions:  No medication changes. *If you need a refill on your cardiac medications before your next appointment, please call your pharmacy*   Lab Work: None ordered If you have labs (blood work) drawn today and your tests are completely normal, you will receive your results only by: MyChart Message (if you have MyChart) OR A paper copy in the mail If you have any lab test that is abnormal or we need to change your treatment, we will call you to review the results.   Testing/Procedures: Your physician has requested that you have a lexiscan myoview. For further information please visit [email or web address]. Please follow instruction sheet, as given.  The test will take approximately 3 to 4 hours to complete; you may bring reading material.  If someone comes with you to your appointment, they will need to remain in the main lobby due to limited space in the testing area. **If you are pregnant or breastfeeding, please notify the nuclear lab prior to your appointment**  How to prepare for your Myocardial Perfusion Test: Do not eat or drink 3 hours prior to your test, except you may have water. Do not consume products containing caffeine (regular or decaffeinated) 12 hours prior to your test. (ex: coffee, chocolate, sodas, tea). Do bring a list of your current medications with you.  If not listed below, you may take your medications as normal. Do wear comfortable clothes (no dresses or overalls) and walking shoes, tennis shoes preferred (No heels or open toe shoes are allowed). Do NOT wear cologne, perfume, aftershave, or lotions (deodorant is allowed). If these instructions are not followed, your test will have to be rescheduled.    Follow-Up: At [facility], you and your health needs are our priority.  As part of our continuing mission to provide you with exceptional heart care, we have created designated Provider Care Teams.  These Care Teams include your primary  Cardiologist (physician) and Advanced Practice Providers (APPs -  Physician Assistants and Nurse Practitioners) who all work together to provide you with the care you need, when you need it.  We recommend signing up for the patient portal called "MyChart".  Sign up information is provided on this After Visit Summary.  MyChart is used to connect with patients for Virtual Visits (Telemedicine).  Patients are able to view lab/test results, encounter notes, upcoming appointments, etc.  Non-urgent messages can be sent to your provider as well.   To learn more about what you can do with MyChart, go to [email or web address].    Your next appointment:   3 month(s)  The format for your next appointment:   In Person  Provider:   Rajan Revankar, MD   Other Instructions Cardiac Nuclear Scan A cardiac nuclear scan is a test that is done to check the flow of blood to your heart. It is done when you are resting and when you are exercising. The test looks for problems such as: Not enough blood reaching a portion of the heart. The heart muscle not working as it should. You may need this test if: You have heart disease. You have had lab results that are not normal. You have had heart surgery or a balloon procedure to open up blocked arteries (angioplasty). You have chest pain. You have shortness of breath. In this test, a special dye (tracer) is put into your bloodstream. The tracer will travel to your heart. A camera will then take pictures of your heart to see how the tracer moves through your heart.   This test is usually done at a hospital and takes 2-4 hours. Tell a doctor about: Any allergies you have. All medicines you are taking, including vitamins, herbs, eye drops, creams, and over-the-counter medicines. Any problems you or family members have had with anesthetic medicines. Any blood disorders you have. Any surgeries you have had. Any medical conditions you have. Whether you are pregnant or  may be pregnant. What are the risks? Generally, this is a safe test. However, problems may occur, such as: Serious chest pain and heart attack. This is only a risk if the stress portion of the test is done. Rapid heartbeat. A feeling of warmth in your chest. This feeling usually does not last long. Allergic reaction to the tracer. What happens before the test? Ask your doctor about changing or stopping your normal medicines. This is important. Follow instructions from your doctor about what you cannot eat or drink. Remove your jewelry on the day of the test. What happens during the test? An IV tube will be inserted into one of your veins. Your doctor will give you a small amount of tracer through the IV tube. You will wait for 20-40 minutes while the tracer moves through your bloodstream. Your heart will be monitored with an electrocardiogram (ECG). You will lie down on an exam table. Pictures of your heart will be taken for about 15-20 minutes. You may also have a stress test. For this test, one of these things may be done: You will be asked to exercise on a treadmill or a stationary bike. You will be given medicines that will make your heart work harder. This is done if you are unable to exercise. When blood flow to your heart has peaked, a tracer will again be given through the IV tube. After 20-40 minutes, you will get back on the exam table. More pictures will be taken of your heart. Depending on the tracer that is used, more pictures may need to be taken 3-4 hours later. Your IV tube will be removed when the test is over. The test may vary among doctors and hospitals. What happens after the test? Ask your doctor: Whether you can return to your normal schedule, including diet, activities, and medicines. Whether you should drink more fluids. This will help to remove the tracer from your body. Drink enough fluid to keep your pee (urine) pale yellow. Ask your doctor, or the department  that is doing the test: When will my results be ready? How will I get my results? Summary A cardiac nuclear scan is a test that is done to check the flow of blood to your heart. Tell your doctor whether you are pregnant or may be pregnant. Before the test, ask your doctor about changing or stopping your normal medicines. This is important. Ask your doctor whether you can return to your normal activities. You may be asked to drink more fluids. This information is not intended to replace advice given to you by your health care provider. Make sure you discuss any questions you have with your health care provider. Document Revised: 10/07/2018 Document Reviewed: 12/01/2017 Elsevier Patient Education  2021 Elsevier Inc.    

## 2020-07-10 NOTE — Progress Notes (Signed)
Cardiology Office Note:    Date:  07/10/2020   ID:  Todd Sampson, DOB 08-12-1934, MRN [id number]  PCP:  Townsend Roger, MD  Cardiologist:  Jenean Lindau, MD   Referring MD: Townsend Roger, MD    ASSESSMENT:    1. Coronary artery disease of native artery of native heart with stable angina pectoris ([place])   2. Essential hypertension   3. Mixed hyperlipidemia   4. Permanent atrial fibrillation ([place])   5. H/O: hemorrhoidectomy    PLAN:    In order of problems listed above:  1. Coronary artery disease: Secondary prevention stressed with the patient.  Importance of compliance with diet medication stressed and vocalized understanding.  [facility].  He is chest pain-free and asymptomatic with activities of daily living.  His stress test is pending. 2. Essential hypertension: Blood pressure is stable and diet was emphasized 3. Mixed dyslipidemia: Lipids were reviewed and diet was emphasized to the patient. 4. Permanent atrial fibrillation:I discussed with the patient atrial fibrillation, disease process. Management and therapy including rate and rhythm control, anticoagulation benefits and potential risks were discussed extensively with the patient. Patient had multiple questions which were answered to patient's satisfaction. 5. Patient will be seen in follow-up appointment in 3 months or earlier if the patient has any concerns    Medication Adjustments/Labs and Tests Ordered: Current medicines are reviewed at length with the patient today.  Concerns regarding medicines are outlined above.  No orders of the defined types were placed in this encounter.  No orders of the defined types were placed in this encounter.    No chief complaint on file.    History of Present Illness:    Todd Sampson is a 86 y.o. male.  Patient has past medical history of coronary artery disease post CABG surgery, essential hypertension dyslipidemia and diabetes mellitus.  He  was evaluated by me and had symptoms of chest discomfort.  His troponin was abnormal so I sent him to the [facility].  They repeated his blood work and there was troponin which was normal they sent him home.  Subsequently is done fine.  No chest pain orthopnea or PND.  He takes aspirin and also Eliquis for his atrial fibrillation.  At the time of my evaluation, the patient is alert awake oriented and in no distress.  He has not ambulated much because of back strain that he developed when he was trying to pick his wife of the flow.  This is been evaluated by his primary care provider.  Past Medical History:  Diagnosis Date  . Anemia   . Barrett's esophagus   . BMI 32.0-32.9,adult 11/09/2019  . Calculus of gallbladder with chronic cholecystitis without obstruction 11/09/2019  . Coronary artery disease of native artery of native heart with stable angina pectoris ([place]) 06/06/2016  . Diabetes mellitus   . Diet-controlled diabetes mellitus ([place]) 06/06/2016  . Dyslipidemia (high LDL; low HDL) 06/06/2016  . Essential hypertension 06/06/2016  . Guillain-Barre disease ([place])   . H/O: hemorrhoidectomy   . Heart disease   . Hx of CABG 10/02/2017  . Hyperlipidemia   . Hypertension   . Mitral regurgitation 07/26/2019  . Non-intractable vomiting with nausea 11/09/2019  . Permanent atrial fibrillation ([place]) 06/06/2016  . Postoperative examination 12/13/2019  . Pre-operative cardiovascular examination 10/02/2017  . Primary osteoarthritis of left hip 04/20/2018   Added automatically from request for surgery [phone]  Formatting of this note might be  different from the original. Added automatically from request for surgery [phone]  . Prostate cancer ([place])   . Salivary gland calculi   . Special screening examination for viral disease 11/09/2019  . Toe pain, bilateral 09/10/2019  . Umbilical hernia without obstruction and without gangrene 11/09/2019    Past Surgical History:  Procedure Laterality Date  .  APPENDECTOMY    . CARDIAC BYPASS    . REPLACEMENT TOTAL KNEE BILATERAL      Current Medications: Current Meds  Medication Sig  . amLODipine (NORVASC) 10 MG tablet Take 10 mg by mouth daily.  [name] apixaban (ELIQUIS) 5 MG TABS tablet Take 1 tablet (5 mg total) by mouth 2 (two) times daily.  . finasteride (PROSCAR) 5 MG tablet Take 5 mg by mouth daily.   . folic acid (FOLVITE) 1 MG tablet Take 1 mg by mouth daily.  . isosorbide mononitrate (IMDUR) 120 MG 24 hr tablet Take 1 tablet (120 mg total) by mouth daily.  [name] lovastatin (MEVACOR) 20 MG tablet Take 1 tablet (20 mg total) by mouth daily at 6 PM.  . nitroGLYCERIN (NITROSTAT) 0.4 MG SL tablet Place 1 tablet (0.4 mg total) under the tongue every 5 (five) minutes as needed for chest pain.  [name] omega-3 acid ethyl esters (LOVAZA) 1 g capsule Take 500 mg by mouth daily.  . Omega-3 Fatty Acids (FISH OIL) 1000 MG CAPS Take 1,000 mg by mouth daily.  . pantoprazole (PROTONIX) 40 MG tablet Take 40 mg by mouth daily.  [name] thiamine 100 MG tablet Take 100 mg by mouth daily.     Allergies:   Penicillins   Social History   Socioeconomic History  . Marital status: Married    Spouse name: Not on file  . Number of children: Not on file  . Years of education: Not on file  . Highest education level: Not on file  Occupational History  . Not on file  Tobacco Use  . Smoking status: Former Research scientist (life sciences)  . Smokeless tobacco: Never Used  Vaping Use  . Vaping Use: Never used  Substance and Sexual Activity  . Alcohol use: No  . Drug use: No  . Sexual activity: Not on file  Other Topics Concern  . Not on file  Social History Narrative  . Not on file   Social Determinants of Health   Financial Resource Strain: Not on file  Food Insecurity: Not on file  Transportation Needs: Not on file  Physical Activity: Not on file  Stress: Not on file  Social Connections: Not on file     Family History: The patient's family history includes Cancer in his brother and  mother; Heart disease in his brother, father, and sister.  ROS:   Please see the history of present illness.    All other systems reviewed and are negative.  EKGs/Labs/Other Studies Reviewed:    The following studies were reviewed today: I discussed my findings with the patient at length and emergency room records were reviewed   Recent Labs: 08/05/2019: ALT 18; TSH 2.220 06/28/2020: BUN 17; Creatinine, Ser 1.20; Hemoglobin 13.6; Platelets 167; Potassium 3.8; Sodium 138  Recent Lipid Panel    Component Value Date/Time   CHOL 102 08/05/2019 0818   TRIG 60 08/05/2019 0818   HDL 30 (L) 08/05/2019 0818   CHOLHDL 3.4 08/05/2019 0818   LDLCALC 58 08/05/2019 0818    Physical Exam:    VS:  BP 134/78   Pulse 70   Ht 5\' 9"  (1.753 m)  Wt 211 lb 9.6 oz (96 kg)   SpO2 96%   BMI 31.25 kg/m     Wt Readings from Last 3 Encounters:  07/10/20 211 lb 9.6 oz (96 kg)  06/28/20 217 lb (98.4 kg)  06/28/20 217 lb (98.4 kg)     GEN: Patient is in no acute distress HEENT: Normal NECK: No JVD; No carotid bruits LYMPHATICS: No lymphadenopathy CARDIAC: Hear sounds regular, 2/6 systolic murmur at the apex. RESPIRATORY:  Clear to auscultation without rales, wheezing or rhonchi  ABDOMEN: Soft, non-tender, non-distended MUSCULOSKELETAL:  No edema; No deformity  SKIN: Warm and dry NEUROLOGIC:  Alert and oriented x 3 PSYCHIATRIC:  Normal affect   Signed, Jenean Lindau, MD  07/10/2020 9:02 AM    [place]

## 2020-07-10 NOTE — Addendum Note (Signed)
Addended by: Truddie Hidden on: 07/10/2020 09:12 AM   Modules accepted: Orders

## 2020-07-10 NOTE — Addendum Note (Signed)
Addended by: Truddie Hidden on: 07/10/2020 09:10 AM   Modules accepted: Orders

## 2020-07-11 NOTE — Addendum Note (Signed)
Addended by: Jyl Heinz R on: 07/11/2020 02:12 PM   Modules accepted: Orders

## 2020-07-12 ENCOUNTER — Telehealth: Payer: Self-pay

## 2020-07-12 NOTE — Telephone Encounter (Signed)
Detailed instructions left on the patient's answering machine. Asked to call back with any questions. S.Harlei Lehrmann EMTP 

## 2020-07-14 DIAGNOSIS — R0602 Shortness of breath: Secondary | ICD-10-CM | POA: Diagnosis not present

## 2020-07-14 DIAGNOSIS — I700 Atherosclerosis of aorta: Secondary | ICD-10-CM | POA: Diagnosis not present

## 2020-07-14 DIAGNOSIS — R0781 Pleurodynia: Secondary | ICD-10-CM | POA: Diagnosis not present

## 2020-07-26 DIAGNOSIS — I10 Essential (primary) hypertension: Secondary | ICD-10-CM | POA: Diagnosis not present

## 2020-07-26 DIAGNOSIS — I25118 Atherosclerotic heart disease of native coronary artery with other forms of angina pectoris: Secondary | ICD-10-CM | POA: Diagnosis not present

## 2020-07-26 DIAGNOSIS — E119 Type 2 diabetes mellitus without complications: Secondary | ICD-10-CM | POA: Diagnosis not present

## 2020-07-27 ENCOUNTER — Ambulatory Visit (INDEPENDENT_AMBULATORY_CARE_PROVIDER_SITE_OTHER): Payer: Medicare Other

## 2020-07-27 ENCOUNTER — Other Ambulatory Visit: Payer: Self-pay

## 2020-07-27 DIAGNOSIS — R072 Precordial pain: Secondary | ICD-10-CM

## 2020-07-27 LAB — MYOCARDIAL PERFUSION IMAGING
Peak HR: 76 {beats}/min
Rest HR: 58 {beats}/min
SDS: 0
SRS: 0
SSS: 0
TID: 1.06

## 2020-07-27 MED ORDER — REGADENOSON 0.4 MG/5ML IV SOLN
0.4000 mg | Freq: Once | INTRAVENOUS | Status: AC
  Administered 2020-07-27: 0.4 mg via INTRAVENOUS

## 2020-07-27 MED ORDER — TECHNETIUM TC 99M TETROFOSMIN IV KIT
24.9000 | PACK | Freq: Once | INTRAVENOUS | Status: AC | PRN
  Administered 2020-07-27: 24.9 via INTRAVENOUS

## 2020-07-27 MED ORDER — TECHNETIUM TC 99M TETROFOSMIN IV KIT
8.7000 | PACK | Freq: Once | INTRAVENOUS | Status: AC | PRN
  Administered 2020-07-27: 8.7 via INTRAVENOUS

## 2020-08-11 ENCOUNTER — Other Ambulatory Visit: Payer: Self-pay | Admitting: Cardiology

## 2020-08-11 NOTE — Telephone Encounter (Signed)
Prescription refill request for Eliquis received. Indication:atrial fibrillation Last office visit:1/22 revankar Scr:1.2 12/21 Age: 86 Weight:95.7 kg  Prescription refilled

## 2020-08-30 ENCOUNTER — Ambulatory Visit: Payer: Medicare Other | Admitting: Cardiology

## 2020-10-04 ENCOUNTER — Ambulatory Visit (INDEPENDENT_AMBULATORY_CARE_PROVIDER_SITE_OTHER): Payer: Medicare Other | Admitting: Cardiology

## 2020-10-04 ENCOUNTER — Other Ambulatory Visit: Payer: Self-pay

## 2020-10-04 ENCOUNTER — Encounter: Payer: Self-pay | Admitting: Cardiology

## 2020-10-04 VITALS — BP 128/72 | HR 60 | Ht 69.6 in | Wt 212.0 lb

## 2020-10-04 DIAGNOSIS — E782 Mixed hyperlipidemia: Secondary | ICD-10-CM

## 2020-10-04 DIAGNOSIS — I10 Essential (primary) hypertension: Secondary | ICD-10-CM | POA: Diagnosis not present

## 2020-10-04 DIAGNOSIS — Z1329 Encounter for screening for other suspected endocrine disorder: Secondary | ICD-10-CM

## 2020-10-04 DIAGNOSIS — I4821 Permanent atrial fibrillation: Secondary | ICD-10-CM | POA: Diagnosis not present

## 2020-10-04 DIAGNOSIS — Z951 Presence of aortocoronary bypass graft: Secondary | ICD-10-CM | POA: Diagnosis not present

## 2020-10-04 DIAGNOSIS — I25118 Atherosclerotic heart disease of native coronary artery with other forms of angina pectoris: Secondary | ICD-10-CM | POA: Diagnosis not present

## 2020-10-04 NOTE — Patient Instructions (Signed)
Medication Instructions:  No medication changes. *If you need a refill on your cardiac medications before your next appointment, please call your pharmacy*   Lab Work: Your physician recommends that you return for lab work in: 1 month. You need to have labs done when you are fasting.  You can come Monday through Friday 8:30 am to 12:00 pm and 1:15 to 4:30. You do not need to make an appointment as the order has already been placed. The labs you are going to have done are BMET, TSH, LFT and Lipids.  If you have labs (blood work) drawn today and your tests are completely normal, you will receive your results only by: [name] MyChart Message (if you have MyChart) OR . A paper copy in the mail If you have any lab test that is abnormal or we need to change your treatment, we will call you to review the results.   Testing/Procedures: None ordered   Follow-Up: At [facility], you and your health needs are our priority.  As part of our continuing mission to provide you with exceptional heart care, we have created designated Provider Care Teams.  These Care Teams include your primary Cardiologist (physician) and Advanced Practice Providers (APPs -  Physician Assistants and Nurse Practitioners) who all work together to provide you with the care you need, when you need it.  We recommend signing up for the patient portal called "MyChart".  Sign up information is provided on this After Visit Summary.  MyChart is used to connect with patients for Virtual Visits (Telemedicine).  Patients are able to view lab/test results, encounter notes, upcoming appointments, etc.  Non-urgent messages can be sent to your provider as well.   To learn more about what you can do with MyChart, go to [email or web address].    Your next appointment:   6 month(s)  The format for your next appointment:   In Person  Provider:   Jyl Heinz, MD   Other Instructions NA

## 2020-10-04 NOTE — Progress Notes (Signed)
Cardiology Office Note:    Date:  10/04/2020   ID:  Todd Sampson, DOB 02-Jan-1935, MRN [id number]  PCP:  Townsend Roger, MD  Cardiologist:  Jenean Lindau, MD   Referring MD: Townsend Roger, MD    ASSESSMENT:    1. Coronary artery disease of native artery of native heart with stable angina pectoris ([place])   2. Essential hypertension   3. Permanent atrial fibrillation ([place])   4. Hx of CABG   5. Mixed hyperlipidemia    PLAN:    In order of problems listed above:  1. Coronary artery disease: Secondary prevention stressed with the patient.  Importance of compliance with diet medication stressed and he vocalized understanding.  I told him to walk at least half an hour a day 5 days a week and he promises to do so. 2. Prominent atrial fibrillation:I discussed with the patient atrial fibrillation, disease process. Management and therapy including rate and rhythm control, anticoagulation benefits and potential risks were discussed extensively with the patient. Patient had multiple questions which were answered to patient's satisfaction. 3. Essential hypertension: Blood pressure stable and diet was emphasized.  Lifestyle modification was also revisited. 4. Mixed dyslipidemia: On statin therapy.  Blood work reviewed from [facility] 5. Patient will be seen in follow-up appointment in 6 months or earlier if the patient has any concerns    Medication Adjustments/Labs and Tests Ordered: Current medicines are reviewed at length with the patient today.  Concerns regarding medicines are outlined above.  No orders of the defined types were placed in this encounter.  No orders of the defined types were placed in this encounter.    No chief complaint on file.    History of Present Illness:    Todd Sampson is a 86 y.o. male.  Patient has past medical history of coronary artery disease post CABG, essential hypertension and dyslipidemia.  He denies any problems at this time and takes care of  activities of daily living.  No chest pain orthopnea or PND.  At the time of my evaluation, the patient is alert awake oriented and in no distress.  He walks on a regular basis.  Past Medical History:  Diagnosis Date  . Anemia   . Barrett's esophagus   . BMI 32.0-32.9,adult 11/09/2019  . Calculus of gallbladder with chronic cholecystitis without obstruction 11/09/2019  . Coronary artery disease of native artery of native heart with stable angina pectoris ([place]) 06/06/2016  . Diet-controlled diabetes mellitus ([place]) 06/06/2016  . Dyslipidemia (high LDL; low HDL) 06/06/2016  . Essential hypertension 06/06/2016  . Guillain-Barre disease ([place])   . H/O: hemorrhoidectomy   . Heart disease   . Hx of CABG 10/02/2017  . Hyperlipidemia   . Hypertension   . Mitral regurgitation 07/26/2019  . Non-intractable vomiting with nausea 11/09/2019  . Permanent atrial fibrillation ([place]) 06/06/2016  . Postoperative examination 12/13/2019  . Pre-operative cardiovascular examination 10/02/2017  . Primary osteoarthritis of left hip 04/20/2018   Added automatically from request for surgery [phone]  Formatting of this note might be different from the original. Added automatically from request for surgery [phone]  . Prostate cancer ([place])   . Salivary gland calculi   . Special screening examination for viral disease 11/09/2019  . Toe pain, bilateral 09/10/2019  . Umbilical hernia without obstruction and without gangrene 11/09/2019    Past Surgical History:  Procedure Laterality Date  . APPENDECTOMY    . CARDIAC BYPASS    . REPLACEMENT TOTAL  KNEE BILATERAL      Current Medications: Current Meds  Medication Sig  . amLODipine (NORVASC) 10 MG tablet Take 10 mg by mouth daily.  [name] apixaban (ELIQUIS) 5 MG TABS tablet Take 5 mg by mouth 2 (two) times daily.  . finasteride (PROSCAR) 5 MG tablet Take 5 mg by mouth daily.   . folic acid (FOLVITE) 1 MG tablet Take 1 mg by mouth daily.  . isosorbide mononitrate (IMDUR) 120 MG 24 hr  tablet Take 1 tablet (120 mg total) by mouth daily.  [name] lisinopril (ZESTRIL) 40 MG tablet Take 40 mg by mouth daily.  [name] lovastatin (MEVACOR) 20 MG tablet Take 1 tablet (20 mg total) by mouth daily at 6 PM.  . nitroGLYCERIN (NITROSTAT) 0.4 MG SL tablet Place 1 tablet (0.4 mg total) under the tongue every 5 (five) minutes as needed for chest pain.  . Omega-3 Fatty Acids (FISH OIL) 1000 MG CAPS Take 1,000 mg by mouth daily.  . pantoprazole (PROTONIX) 40 MG tablet Take 40 mg by mouth daily.  [name] thiamine 100 MG tablet Take 100 mg by mouth daily.     Allergies:   Penicillins   Social History   Socioeconomic History  . Marital status: Married    Spouse name: Not on file  . Number of children: Not on file  . Years of education: Not on file  . Highest education level: Not on file  Occupational History  . Not on file  Tobacco Use  . Smoking status: Former Research scientist (life sciences)  . Smokeless tobacco: Never Used  Vaping Use  . Vaping Use: Never used  Substance and Sexual Activity  . Alcohol use: No  . Drug use: No  . Sexual activity: Not on file  Other Topics Concern  . Not on file  Social History Narrative  . Not on file   Social Determinants of Health   Financial Resource Strain: Not on file  Food Insecurity: Not on file  Transportation Needs: Not on file  Physical Activity: Not on file  Stress: Not on file  Social Connections: Not on file     Family History: The patient's family history includes Cancer in his brother and mother; Heart disease in his brother, father, and sister.  ROS:   Please see the history of present illness.    All other systems reviewed and are negative.  EKGs/Labs/Other Studies Reviewed:    The following studies were reviewed today: Study Highlights   There was no ST segment deviation noted during stress.  No T wave inversion was noted during stress.  This is a low risk study.  The study is normal. This is a non-gated study.      Recent  Labs: 06/28/2020: BUN 17; Creatinine, Ser 1.20; Hemoglobin 13.6; Platelets 167; Potassium 3.8; Sodium 138  Recent Lipid Panel    Component Value Date/Time   CHOL 102 08/05/2019 0818   TRIG 60 08/05/2019 0818   HDL 30 (L) 08/05/2019 0818   CHOLHDL 3.4 08/05/2019 0818   LDLCALC 58 08/05/2019 0818    Physical Exam:    VS:  BP 128/72   Pulse 60   Ht 5' 9.6" (1.768 m)   Wt 212 lb (96.2 kg)   SpO2 99%   BMI 30.77 kg/m     Wt Readings from Last 3 Encounters:  10/04/20 212 lb (96.2 kg)  07/27/20 211 lb (95.7 kg)  07/10/20 211 lb 9.6 oz (96 kg)     GEN: Patient is in no acute distress  HEENT: Normal NECK: No JVD; No carotid bruits LYMPHATICS: No lymphadenopathy CARDIAC: Hear sounds regular, 2/6 systolic murmur at the apex. RESPIRATORY:  Clear to auscultation without rales, wheezing or rhonchi  ABDOMEN: Soft, non-tender, non-distended MUSCULOSKELETAL:  No edema; No deformity  SKIN: Warm and dry NEUROLOGIC:  Alert and oriented x 3 PSYCHIATRIC:  Normal affect   Signed, Jenean Lindau, MD  10/04/2020 2:20 PM    [facility]

## 2020-10-24 DIAGNOSIS — R351 Nocturia: Secondary | ICD-10-CM | POA: Diagnosis not present

## 2020-10-24 DIAGNOSIS — C61 Malignant neoplasm of prostate: Secondary | ICD-10-CM | POA: Diagnosis not present

## 2020-10-27 DIAGNOSIS — E119 Type 2 diabetes mellitus without complications: Secondary | ICD-10-CM | POA: Diagnosis not present

## 2020-10-27 DIAGNOSIS — I10 Essential (primary) hypertension: Secondary | ICD-10-CM | POA: Diagnosis not present

## 2021-01-24 DIAGNOSIS — M1611 Unilateral primary osteoarthritis, right hip: Secondary | ICD-10-CM | POA: Diagnosis not present

## 2021-01-29 DIAGNOSIS — I10 Essential (primary) hypertension: Secondary | ICD-10-CM | POA: Diagnosis not present

## 2021-01-29 DIAGNOSIS — E119 Type 2 diabetes mellitus without complications: Secondary | ICD-10-CM | POA: Diagnosis not present

## 2021-01-30 ENCOUNTER — Other Ambulatory Visit: Payer: Self-pay | Admitting: Cardiology

## 2021-01-30 NOTE — Telephone Encounter (Signed)
Prescription refill request for Eliquis received. Indication:afib Last office visit:revankar 10/04/20 Scr:1.2 06/28/20 Age: 66mWWeight:68.2kg

## 2021-03-14 ENCOUNTER — Telehealth: Payer: Self-pay | Admitting: Cardiology

## 2021-03-14 NOTE — Telephone Encounter (Signed)
Left VM to evaluate symptoms.   Will send to pharmD to address eliquis.

## 2021-03-14 NOTE — Telephone Encounter (Signed)
Patient with diagnosis of afib on Eliquis for anticoagulation.    Procedure: right THA Date of procedure: 05/11/21  CHA2DS2-VASc Score = 5  This indicates a 7.2% annual risk of stroke. The patient's score is based upon: CHF History: 0 HTN History: 1 Diabetes History: 1 Stroke History: 0 Vascular Disease History: 1 Age Score: 2 Gender Score: 0   CrCl 45m/min using adjusted body weight Platelet count 167K  Per office protocol, patient can hold Eliquis for 3 days prior to procedure.

## 2021-03-14 NOTE — Telephone Encounter (Signed)
   [facility] Pre-operative Risk Assessment    Patient Name: PERVIS MACINTYRE  DOB: [birth year] MRN: [id number]  HEARTCARE STAFF:  - IMPORTANT!!!!!! Under Visit Info/Reason for Call, type in Other and utilize the format Clearance MM/DD/YY or Clearance TBD. Do not use dashes or single digits. - Please review there is not already an duplicate clearance open for this procedure. - If request is for dental extraction, please clarify the # of teeth to be extracted. - If the patient is currently at the dentist's office, call Pre-Op Callback Staff (MA/nurse) to input urgent request.  - If the patient is not currently in the dentist office, please route to the Pre-Op pool.  Request for surgical clearance:  What type of surgery is being performed? R Total Hip Arthroplasty   When is this surgery scheduled? 05/11/21  What type of clearance is required (medical clearance vs. Pharmacy clearance to hold med vs. Both)? Both  Are there any medications that need to be held prior to surgery and how long? TBD by Cardiology  Practice name and name of physician performing surgery? DR. Beckey Rutter, [facility]  What is the office phone number? [phone]   7.   What is the office fax number? [phone]  8.   Anesthesia type (None, local, MAC, general) ? General    Johnna Acosta 03/14/2021, 10:58 AM  _________________________________________________________________   (provider comments below)

## 2021-03-15 NOTE — Telephone Encounter (Signed)
Patient was returning the call asking that he gets a call back on his cell. Please advise

## 2021-03-16 NOTE — Telephone Encounter (Signed)
LVM on home and cell phone for pt to call and schedule a pre op appt/kbl 03/16/21

## 2021-03-16 NOTE — Telephone Encounter (Signed)
   Name: Todd Sampson  DOB: [birth year]  MRN: [id number]  Primary Cardiologist: Jenean Lindau, MD  Chart reviewed as part of pre-operative protocol coverage and before his R hip surgery 05/11/21.  He has history of CAD s/p CABG, hypertension, HLD permanent atrial fibrillation on anticoagulation.  He was last seen in office 10/04/2020.  On 9/16, I called the pt to review symptoms and evaluate RCRI and METS before his surgery.  He reports CP since his last visit, though not concerning to him.  He reports he usually takes 1 nitro, after which time his chest pain goes away.  On further discussion, he is not certain that it is necessarily chest pain, though was unable to describe it further.    He also states that the presurgery paperwork he has in his possession requires an EKG within the last 6 months. His last EKG was 05/2020 on review of chart.  Per pharmacy, he should hold Eliquis 3 days prior to his procedure.  Pre-op covering staff: - Please schedule appointment and call patient to inform them. - Please contact requesting surgeon's office via preferred method (i.e, phone, fax) to inform them of need for appointment prior to surgery. --Please place in the appointment notes that this is preop and make sure he gets an EKG prior to his surgery, as requested by the surgeon.   As a reminder to the provider seeing the patient, per pharmacy, hold Eliquis 3d before procedure.  I will route this back to the preop pool and remove from the APP basket.  Signed, Arvil Chaco, PA-C 03/16/2021, 11:45 AM

## 2021-03-20 ENCOUNTER — Other Ambulatory Visit: Payer: Self-pay

## 2021-03-20 NOTE — Telephone Encounter (Signed)
Pt has appt tomorrow with Dr. Geraldo Pitter for pre op clearance. Will forward notes to MD for upcoming appt. Will send FYI to surgeon's office pt has appt.

## 2021-03-20 NOTE — Telephone Encounter (Signed)
Left message for the pt to call the office to schedule an appt for pre op clearance with Dr. Geraldo Pitter or APP.

## 2021-03-21 ENCOUNTER — Encounter: Payer: Self-pay | Admitting: Cardiology

## 2021-03-21 ENCOUNTER — Other Ambulatory Visit: Payer: Self-pay

## 2021-03-21 ENCOUNTER — Ambulatory Visit (INDEPENDENT_AMBULATORY_CARE_PROVIDER_SITE_OTHER): Payer: Medicare Other | Admitting: Cardiology

## 2021-03-21 VITALS — BP 180/70 | HR 63 | Ht 69.6 in | Wt 213.0 lb

## 2021-03-21 DIAGNOSIS — I340 Nonrheumatic mitral (valve) insufficiency: Secondary | ICD-10-CM

## 2021-03-21 DIAGNOSIS — I4821 Permanent atrial fibrillation: Secondary | ICD-10-CM

## 2021-03-21 DIAGNOSIS — Z09 Encounter for follow-up examination after completed treatment for conditions other than malignant neoplasm: Secondary | ICD-10-CM | POA: Diagnosis not present

## 2021-03-21 DIAGNOSIS — Z951 Presence of aortocoronary bypass graft: Secondary | ICD-10-CM

## 2021-03-21 DIAGNOSIS — I25118 Atherosclerotic heart disease of native coronary artery with other forms of angina pectoris: Secondary | ICD-10-CM

## 2021-03-21 DIAGNOSIS — I10 Essential (primary) hypertension: Secondary | ICD-10-CM | POA: Diagnosis not present

## 2021-03-21 MED ORDER — HYDROCHLOROTHIAZIDE 25 MG PO TABS: 25.0000 mg | ORAL_TABLET | Freq: Every day | ORAL | 3 refills | Status: DC

## 2021-03-21 NOTE — Progress Notes (Signed)
Cardiology Office Note:    Date:  03/21/2021   ID:  Todd Sampson, DOB Feb 28, 1935, MRN [id number]  PCP:  Townsend Roger, MD  Cardiologist:  Jenean Lindau, MD   Referring MD: Townsend Roger, MD    ASSESSMENT:    1. Coronary artery disease of native artery of native heart with stable angina pectoris ([place])   2. Essential hypertension   3. Permanent atrial fibrillation ([place])   4. Mitral valve insufficiency, unspecified etiology   5. Hx of CABG   6. Postoperative examination    PLAN:    In order of problems listed above:  Preoperative stratification: I discussed my findings with the patient in extensive length.  He has stable angina pectoris.  However he has history of chest pain at times relieved with nitroglycerin.  I am not sure whether this is completely chest pain or whether it is an issue which is driving his blood pressure up because of pain.  At any rate to check I will do a Lexiscan sestamibi.  He has had 1 done in December which was negative.  But its been quite sometime.  I discussed this with him and he understands. Coronary artery disease: As mentioned above.  Stable angina pectoris.  Will evaluate with Lexiscan.  Secondary prevention stressed.  Importance of compliance with diet medication stressed and vocalized understanding. Essential hypertension: Blood pressure is elevated and diet was emphasized.  I will add valsartan 40 mg daily regimen.  He will keep a track of his blood pressures at home.  He will have a Chem-7 today and at follow-up appointment in a week's time with nurse visit. Mixed dyslipidemia and diabetes mellitus: Compliance urged weight reduction stressed risks of obesity explained and he promises to do better. Patient will be seen in follow-up appointment in 1 months or earlier if the patient has any concerns    Medication Adjustments/Labs and Tests Ordered: Current medicines are reviewed at length with the patient today.  Concerns regarding medicines are  outlined above.  No orders of the defined types were placed in this encounter.  No orders of the defined types were placed in this encounter.    Chief Complaint  Patient presents with   Pre-op Exam     History of Present Illness:    Todd Sampson is a 86 y.o. male.  Patient has past medical history of coronary artery disease post CABG surgery, essential hypertension dyslipidemia and diabetes mellitus.  He denies any problems at this time and takes care of activities of daily living without any problem.  He ambulates with a cane because he is planning to undergo hip surgery.  No chest pain orthopnea PND.  Occasionally with his chest tightness he uses nitroglycerin and it helps.  Past Medical History:  Diagnosis Date   Anemia    Barrett's esophagus    BMI 32.0-32.9,adult 11/09/2019   Calculus of gallbladder with chronic cholecystitis without obstruction 11/09/2019   Coronary artery disease of native artery of native heart with stable angina pectoris ([place]) 06/06/2016   Diet-controlled diabetes mellitus ([place]) 06/06/2016   Dyslipidemia (high LDL; low HDL) 06/06/2016   Essential hypertension 06/06/2016   Guillain-Barre disease ([place])    H/O: hemorrhoidectomy    Heart disease    Hx of CABG 10/02/2017   Hyperlipidemia    Hypertension    Mitral regurgitation 07/26/2019   Non-intractable vomiting with nausea 11/09/2019   Permanent atrial fibrillation ([place]) 06/06/2016   Postoperative examination 12/13/2019   Pre-operative  cardiovascular examination 10/02/2017   Primary osteoarthritis of left hip 04/20/2018   Added automatically from request for surgery [id number]  Formatting of this note might be different from the original. Added automatically from request for surgery [id number]   Prostate cancer [facility])    Salivary gland calculi    Special screening examination for viral disease 11/09/2019   Toe pain, bilateral 1/51/7616   Umbilical hernia without obstruction and without gangrene 11/09/2019    Past  Surgical History:  Procedure Laterality Date   APPENDECTOMY     CARDIAC BYPASS     REPLACEMENT TOTAL KNEE BILATERAL      Current Medications: Current Meds  Medication Sig   amLODipine (NORVASC) 10 MG tablet Take 10 mg by mouth daily.   ELIQUIS 5 MG TABS tablet TAKE ONE TABLET BY MOUTH TWICE DAILY   finasteride (PROSCAR) 5 MG tablet Take 5 mg by mouth daily.    folic acid (FOLVITE) 1 MG tablet Take 1 mg by mouth daily.   hydrALAZINE (APRESOLINE) 25 MG tablet Take 25 mg by mouth 2 (two) times daily.   isosorbide mononitrate (IMDUR) 120 MG 24 hr tablet Take 1 tablet (120 mg total) by mouth daily.   lisinopril (ZESTRIL) 40 MG tablet Take 40 mg by mouth daily.   lovastatin (MEVACOR) 20 MG tablet Take 1 tablet (20 mg total) by mouth daily at 6 PM.   nitroGLYCERIN (NITROSTAT) 0.4 MG SL tablet Place 1 tablet (0.4 mg total) under the tongue every 5 (five) minutes as needed for chest pain.   Omega-3 Fatty Acids (FISH OIL) 1000 MG CAPS Take 1,000 mg by mouth daily.   pantoprazole (PROTONIX) 40 MG tablet Take 40 mg by mouth daily.   thiamine 100 MG tablet Take 100 mg by mouth daily.     Allergies:   Penicillins   Social History   Socioeconomic History   Marital status: Married    Spouse name: Not on file   Number of children: Not on file   Years of education: Not on file   Highest education level: Not on file  Occupational History   Not on file  Tobacco Use   Smoking status: Former   Smokeless tobacco: Never  Vaping Use   Vaping Use: Never used  Substance and Sexual Activity   Alcohol use: No   Drug use: No   Sexual activity: Not on file  Other Topics Concern   Not on file  Social History Narrative   Not on file   Social Determinants of Health   Financial Resource Strain: Not on file  Food Insecurity: Not on file  Transportation Needs: Not on file  Physical Activity: Not on file  Stress: Not on file  Social Connections: Not on file     Family History: The patient's  family history includes Cancer in his brother and mother; Heart disease in his brother, father, and sister.  ROS:   Please see the history of present illness.    All other systems reviewed and are negative.  EKGs/Labs/Other Studies Reviewed:    The following studies were reviewed today: I discussed my findings with the patient at extensive length.  EKG reveals sinus rhythm and nonspecific ST-T changes   Recent Labs: 06/28/2020: BUN 17; Creatinine, Ser 1.20; Hemoglobin 13.6; Platelets 167; Potassium 3.8; Sodium 138  Recent Lipid Panel    Component Value Date/Time   CHOL 102 08/05/2019 0818   TRIG 60 08/05/2019 0818   HDL 30 (L) 08/05/2019 0818   CHOLHDL 3.4  08/05/2019 0818   LDLCALC 58 08/05/2019 0818    Physical Exam:    VS:  BP (!) 180/70 (BP Location: Right Arm, Patient Position: Sitting, Cuff Size: Normal)   Pulse 63   Ht 5' 9.6" (1.768 m)   Wt 213 lb (96.6 kg)   SpO2 98%   BMI 30.91 kg/m     Wt Readings from Last 3 Encounters:  03/21/21 213 lb (96.6 kg)  10/04/20 212 lb (96.2 kg)  07/27/20 211 lb (95.7 kg)     GEN: Patient is in no acute distress HEENT: Normal NECK: No JVD; No carotid bruits LYMPHATICS: No lymphadenopathy CARDIAC: Hear sounds regular, 2/6 systolic murmur at the apex. RESPIRATORY:  Clear to auscultation without rales, wheezing or rhonchi  ABDOMEN: Soft, non-tender, non-distended MUSCULOSKELETAL:  No edema; No deformity  SKIN: Warm and dry NEUROLOGIC:  Alert and oriented x 3 PSYCHIATRIC:  Normal affect   Signed, Jenean Lindau, MD  03/21/2021 3:04 PM    [facility]

## 2021-03-21 NOTE — Patient Instructions (Addendum)
Medication Instructions:  Your physician has recommended you make the following change in your medication:   Start Hydrochlorothiazide 25 mg daily.  *If you need a refill on your cardiac medications before your next appointment, please call your pharmacy*   Lab Work: Your physician recommends that you have a BMET today in the office.  Your physician recommends that you return for lab work in: 8-10 days for repeat BMET. You do not need to fast. This can be done at your nurse visit.  If you have labs (blood work) drawn today and your tests are completely normal, you will receive your results only by: [place] (if you have MyChart) OR A paper copy in the mail If you have any lab test that is abnormal or we need to change your treatment, we will call you to review the results.   Testing/Procedures: Your physician has requested that you have a lexiscan myoview. For further information please visit [email or web address]. Please follow instruction sheet, as given.  The test will take approximately 3 to 4 hours to complete; you may bring reading material.  If someone comes with you to your appointment, they will need to remain in the main lobby due to limited space in the testing area.   How to prepare for your Myocardial Perfusion Test: Do not eat or drink 3 hours prior to your test, except you may have water. Do not consume products containing caffeine (regular or decaffeinated) 12 hours prior to your test. (ex: coffee, chocolate, sodas, tea). Do bring a list of your current medications with you.  If not listed below, you may take your medications as normal. Do wear comfortable clothes (no dresses or overalls) and walking shoes, tennis shoes preferred (No heels or open toe shoes are allowed). Do NOT wear cologne, perfume, aftershave, or lotions (deodorant is allowed). If these instructions are not followed, your test will have to be rescheduled.    Follow-Up: At [facility], you  and your health needs are our priority.  As part of our continuing mission to provide you with exceptional heart care, we have created designated Provider Care Teams.  These Care Teams include your primary Cardiologist (physician) and Advanced Practice Providers (APPs -  Physician Assistants and Nurse Practitioners) who all work together to provide you with the care you need, when you need it.  We recommend signing up for the patient portal called "MyChart".  Sign up information is provided on this After Visit Summary.  MyChart is used to connect with patients for Virtual Visits (Telemedicine).  Patients are able to view lab/test results, encounter notes, upcoming appointments, etc.  Non-urgent messages can be sent to your provider as well.   To learn more about what you can do with MyChart, go to [email or web address].    Your next appointment:   2-3 week(s)  The format for your next appointment:   In Person  Provider:   Jyl Heinz, MD   Other Instructions Cardiac Nuclear Scan A cardiac nuclear scan is a test that is done to check the flow of blood to your heart. It is done when you are resting and when you are exercising. The test looks for problems such as: Not enough blood reaching a portion of the heart. The heart muscle not working as it should. You may need this test if: You have heart disease. You have had lab results that are not normal. You have had heart surgery or a balloon procedure to open up blocked arteries (angioplasty). You have  chest pain. You have shortness of breath. In this test, a special dye (tracer) is put into your bloodstream. The tracer will travel to your heart. A camera will then take pictures of your heart to see how the tracer moves through your heart. This test is usually done at a hospital and takes 2-4 hours. Tell a doctor about: Any allergies you have. All medicines you are taking, including vitamins, herbs, eye drops, creams, and  over-the-counter medicines. Any problems you or family members have had with anesthetic medicines. Any blood disorders you have. Any surgeries you have had. Any medical conditions you have. Whether you are pregnant or may be pregnant. What are the risks? Generally, this is a safe test. However, problems may occur, such as: Serious chest pain and heart attack. This is only a risk if the stress portion of the test is done. Rapid heartbeat. A feeling of warmth in your chest. This feeling usually does not last long. Allergic reaction to the tracer. What happens before the test? Ask your doctor about changing or stopping your normal medicines. This is important. Follow instructions from your doctor about what you cannot eat or drink. Remove your jewelry on the day of the test. What happens during the test? An IV tube will be inserted into one of your veins. Your doctor will give you a small amount of tracer through the IV tube. You will wait for 20-40 minutes while the tracer moves through your bloodstream. Your heart will be monitored with an electrocardiogram (ECG). You will lie down on an exam table. Pictures of your heart will be taken for about 15-20 minutes. You may also have a stress test. For this test, one of these things may be done: You will be asked to exercise on a treadmill or a stationary bike. You will be given medicines that will make your heart work harder. This is done if you are unable to exercise. When blood flow to your heart has peaked, a tracer will again be given through the IV tube. After 20-40 minutes, you will get back on the exam table. More pictures will be taken of your heart. Depending on the tracer that is used, more pictures may need to be taken 3-4 hours later. Your IV tube will be removed when the test is over. The test may vary among doctors and hospitals. What happens after the test? Ask your doctor: Whether you can return to your normal schedule,  including diet, activities, and medicines. Whether you should drink more fluids. This will help to remove the tracer from your body. Drink enough fluid to keep your pee (urine) pale yellow. Ask your doctor, or the department that is doing the test: When will my results be ready? How will I get my results? Summary A cardiac nuclear scan is a test that is done to check the flow of blood to your heart. Tell your doctor whether you are pregnant or may be pregnant. Before the test, ask your doctor about changing or stopping your normal medicines. This is important. Ask your doctor whether you can return to your normal activities. You may be asked to drink more fluids. This information is not intended to replace advice given to you by your health care provider. Make sure you discuss any questions you have with your health care provider. Document Revised: 10/07/2018 Document Reviewed: 12/01/2017 Elsevier Patient Education  2021 [place].    Blood Pressure Record Sheet To take your blood pressure, you will need a blood  pressure machine. You can buy a blood pressure machine (blood pressure monitor) at your clinic, drug store, or online. When choosing one, consider: An automatic monitor that has an arm cuff. A cuff that wraps snugly around your upper arm. You should be able to fit only one finger between your arm and the cuff. A device that stores blood pressure reading results. Do not choose a monitor that measures your blood pressure from your wrist or finger. Follow your health care provider's instructions for how to take your blood pressure. To use this form: Get one reading in the morning (a.m.) 1-2 hours after you take any medicines. Get one reading in the evening (p.m.) before supper. Take at least 2 readings with each blood pressure check. This makes sure the results are correct. Wait 1-2 minutes between measurements. Write down the results in the spaces on this form. Repeat this once a  week, or as told by your health care provider.  Make a follow-up appointment with your health care provider to discuss the results. Blood pressure log Date: _______________________ a.m. _____________________(1st reading) HR___________            p.m. _____________________(2nd reading) HR__________  Date: _______________________ a.m. _____________________(1st reading) HR___________            p.m. _____________________(2nd reading) HR__________ Date: _______________________ a.m. _____________________(1st reading) HR___________            p.m. _____________________(2nd reading) HR__________ Date: _______________________ a.m. _____________________(1st reading) HR___________            p.m. _____________________(2nd reading) HR__________  Date: _______________________ a.m. _____________________(1st reading) HR___________            p.m. _____________________(2nd reading) HR__________  Date: _______________________ a.m. _____________________(1st reading) HR___________            p.m. _____________________(2nd reading) HR__________  Date: _______________________ a.m. _____________________(1st reading) HR___________            p.m. _____________________(2nd reading) HR__________   This information is not intended to replace advice given to you by your health care provider. Make sure you discuss any questions you have with your health care provider. Document Revised: 10/06/2019 Document Reviewed: 10/06/2019 Elsevier Patient Education  2021 [employer].

## 2021-03-22 LAB — BASIC METABOLIC PANEL
BUN/Creatinine Ratio: 20 (ref 10–24)
BUN: 24 mg/dL (ref 8–27)
CO2: 22 mmol/L (ref 20–29)
Calcium: 9.2 mg/dL (ref 8.6–10.2)
Chloride: 102 mmol/L (ref 96–106)
Creatinine, Ser: 1.21 mg/dL (ref 0.76–1.27)
Glucose: 119 mg/dL — ABNORMAL HIGH (ref 65–99)
Potassium: 4.7 mmol/L (ref 3.5–5.2)
Sodium: 137 mmol/L (ref 134–144)
eGFR: 59 mL/min/{1.73_m2} — ABNORMAL LOW (ref >59)

## 2021-03-26 ENCOUNTER — Telehealth (HOSPITAL_COMMUNITY): Payer: Self-pay

## 2021-03-26 NOTE — Telephone Encounter (Signed)
Detailed instructions left on the patient's answering machine. Asked to call back with any questions. S.Loise Esguerra EMTP 

## 2021-03-27 ENCOUNTER — Other Ambulatory Visit: Payer: Self-pay

## 2021-03-27 ENCOUNTER — Ambulatory Visit (INDEPENDENT_AMBULATORY_CARE_PROVIDER_SITE_OTHER): Payer: Medicare Other

## 2021-03-27 DIAGNOSIS — I25118 Atherosclerotic heart disease of native coronary artery with other forms of angina pectoris: Secondary | ICD-10-CM

## 2021-03-27 DIAGNOSIS — Z09 Encounter for follow-up examination after completed treatment for conditions other than malignant neoplasm: Secondary | ICD-10-CM

## 2021-03-27 MED ORDER — REGADENOSON 0.4 MG/5ML IV SOLN
0.4000 mg | Freq: Once | INTRAVENOUS | Status: AC
  Administered 2021-03-27: 0.4 mg via INTRAVENOUS

## 2021-03-27 MED ORDER — TECHNETIUM TC 99M TETROFOSMIN IV KIT
30.0000 | PACK | Freq: Once | INTRAVENOUS | Status: AC | PRN
  Administered 2021-03-27: 30 via INTRAVENOUS

## 2021-03-27 MED ORDER — TECHNETIUM TC 99M TETROFOSMIN IV KIT
11.0000 | PACK | Freq: Once | INTRAVENOUS | Status: AC | PRN
  Administered 2021-03-27: 11 via INTRAVENOUS

## 2021-03-28 LAB — MYOCARDIAL PERFUSION IMAGING
LV dias vol: 138 mL (ref 62–150)
LV sys vol: 59 mL
Nuc Stress EF: 57 %
Peak HR: 81 {beats}/min
Rest HR: 71 {beats}/min
Rest Nuclear Isotope Dose: 11 mCi
SDS: 1
SRS: 0
SSS: 1
ST Depression (mm): 0 mm
Stress Nuclear Isotope Dose: 30 mCi
TID: 1

## 2021-03-29 ENCOUNTER — Ambulatory Visit (INDEPENDENT_AMBULATORY_CARE_PROVIDER_SITE_OTHER): Payer: Medicare Other

## 2021-03-29 ENCOUNTER — Other Ambulatory Visit: Payer: Self-pay

## 2021-03-29 VITALS — BP 138/68 | HR 78 | Ht 66.0 in | Wt 206.4 lb

## 2021-03-29 DIAGNOSIS — I340 Nonrheumatic mitral (valve) insufficiency: Secondary | ICD-10-CM | POA: Diagnosis not present

## 2021-03-29 DIAGNOSIS — Z951 Presence of aortocoronary bypass graft: Secondary | ICD-10-CM | POA: Diagnosis not present

## 2021-03-29 DIAGNOSIS — I10 Essential (primary) hypertension: Secondary | ICD-10-CM | POA: Diagnosis not present

## 2021-03-29 DIAGNOSIS — I4821 Permanent atrial fibrillation: Secondary | ICD-10-CM | POA: Diagnosis not present

## 2021-03-29 DIAGNOSIS — I25118 Atherosclerotic heart disease of native coronary artery with other forms of angina pectoris: Secondary | ICD-10-CM | POA: Diagnosis not present

## 2021-03-29 DIAGNOSIS — Z09 Encounter for follow-up examination after completed treatment for conditions other than malignant neoplasm: Secondary | ICD-10-CM | POA: Diagnosis not present

## 2021-03-29 NOTE — Progress Notes (Signed)
Patient came in today for a nurse visit blood pressure check and blood work. His vital signs today are all stable and the labs were completed.   His blood pressure on 03/21/2021 was 180/70. Patient was started on HCTZ 25 mg BID. Today his blood pressure is 138/68. Message sent to Dr. Geraldo Pitter to make him aware of this.

## 2021-03-30 LAB — BASIC METABOLIC PANEL
BUN/Creatinine Ratio: 15 (ref 10–24)
BUN: 20 mg/dL (ref 8–27)
CO2: 22 mmol/L (ref 20–29)
Calcium: 9.4 mg/dL (ref 8.6–10.2)
Chloride: 99 mmol/L (ref 96–106)
Creatinine, Ser: 1.31 mg/dL — ABNORMAL HIGH (ref 0.76–1.27)
Glucose: 120 mg/dL — ABNORMAL HIGH (ref 70–99)
Potassium: 4 mmol/L (ref 3.5–5.2)
Sodium: 137 mmol/L (ref 134–144)
eGFR: 54 mL/min/{1.73_m2} — ABNORMAL LOW (ref >59)

## 2021-04-02 NOTE — Addendum Note (Signed)
Addended by: Truddie Hidden on: 04/02/2021 10:10 AM   Modules accepted: Orders

## 2021-04-06 DIAGNOSIS — Z01810 Encounter for preprocedural cardiovascular examination: Secondary | ICD-10-CM | POA: Diagnosis not present

## 2021-04-06 DIAGNOSIS — M1990 Unspecified osteoarthritis, unspecified site: Secondary | ICD-10-CM | POA: Diagnosis not present

## 2021-04-06 DIAGNOSIS — Z23 Encounter for immunization: Secondary | ICD-10-CM | POA: Diagnosis not present

## 2021-04-09 ENCOUNTER — Telehealth: Payer: Self-pay | Admitting: Cardiology

## 2021-04-09 NOTE — Telephone Encounter (Signed)
   Pt c/o medication issue:  1. Name of Medication: lisinopril (ZESTRIL) 40 MG tablet  2. How are you currently taking this medication (dosage and times per day)?   3. Are you having a reaction (difficulty breathing--STAT)?   4. What is your medication issue? Clinical Pharmacist wanted to know if the patient still needs to be taking this medication.   Loriu, Clinical Pharmacist for Dr. Nona Dell called. She called the patient recently to discuss his care. At this time she was told by the patient that Dr. Geraldo Pitter started the patient on Valsartan. She noted that this patient is already on Lisinopril. She wanted to know if the patient is supposed to be on both medications.  The patient is scheduled to see Dr. Geraldo Pitter 04/10/21. Please follow up with Cecille Rubin. The number provided is her direct number

## 2021-04-10 ENCOUNTER — Ambulatory Visit (INDEPENDENT_AMBULATORY_CARE_PROVIDER_SITE_OTHER): Payer: Medicare Other | Admitting: Cardiology

## 2021-04-10 ENCOUNTER — Other Ambulatory Visit: Payer: Self-pay

## 2021-04-10 ENCOUNTER — Encounter: Payer: Self-pay | Admitting: Cardiology

## 2021-04-10 VITALS — BP 132/80 | HR 68 | Ht 69.0 in | Wt 211.4 lb

## 2021-04-10 DIAGNOSIS — I4821 Permanent atrial fibrillation: Secondary | ICD-10-CM | POA: Diagnosis not present

## 2021-04-10 DIAGNOSIS — Z951 Presence of aortocoronary bypass graft: Secondary | ICD-10-CM | POA: Diagnosis not present

## 2021-04-10 DIAGNOSIS — I25118 Atherosclerotic heart disease of native coronary artery with other forms of angina pectoris: Secondary | ICD-10-CM

## 2021-04-10 DIAGNOSIS — I10 Essential (primary) hypertension: Secondary | ICD-10-CM | POA: Diagnosis not present

## 2021-04-10 NOTE — Progress Notes (Signed)
Cardiology Office Note:    Date:  04/10/2021   ID:  Todd Sampson, DOB May 15, 1935, MRN [id number]  PCP:  Townsend Roger, MD  Cardiologist:  Jenean Lindau, MD   Referring MD: Townsend Roger, MD    ASSESSMENT:    No diagnosis found. PLAN:    In order of problems listed above:  Coronary artery disease: Secondary prevention stressed with the patient.  Importance of compliance with diet medication stressed any vocalized understanding. Preoperative restratification: Blood pressure stable.  Stress test negative for evidence of inducible ischemia.  In view of this he is not at high risk for coronary events during the aforementioned surgery.  Meticulous hemodynamic monitoring will further reduce the risk of coronary events. Essential hypertension: Stable blood pressure Mixed dyslipidemia: On statin therapy and tolerating well. Permanent atrial fibrillation:I discussed with the patient atrial fibrillation, disease process. Management and therapy including rate and rhythm control, anticoagulation benefits and potential risks were discussed extensively with the patient. Patient had multiple questions which were answered to patient's satisfaction.  I discussed bridging with him with Lovenox but is not keen on it.  Eliquis can be therefore withheld for the time felt appropriate by the surgeon and resumed as soon as possible and safe.  Please do not hesitate to call me with any questions in his cardiovascular management. Patient will be seen in follow-up appointment in 6 months or earlier if the patient has any concerns    Medication Adjustments/Labs and Tests Ordered: Current medicines are reviewed at length with the patient today.  Concerns regarding medicines are outlined above.  No orders of the defined types were placed in this encounter.  No orders of the defined types were placed in this encounter.    No chief complaint on file.    History of Present Illness:    Todd Sampson is a  86 y.o. male.  Patient has past medical history of coronary artery disease, essential hypertension, dyslipidemia and diabetes mellitus.  He underwent stress testing for preoperative reasons.  His blood pressure also has been under good control now.  He tells me it is fine at home now and also his blood pressure today is fine.  He is keen to undergo surgery.  At the time of my evaluation, the patient is alert awake oriented and in no distress.  Past Medical History:  Diagnosis Date   Anemia    Barrett's esophagus    BMI 32.0-32.9,adult 11/09/2019   Calculus of gallbladder with chronic cholecystitis without obstruction 11/09/2019   Coronary artery disease of native artery of native heart with stable angina pectoris ([place]) 06/06/2016   Diet-controlled diabetes mellitus ([place]) 06/06/2016   Dyslipidemia (high LDL; low HDL) 06/06/2016   Essential hypertension 06/06/2016   Guillain-Barre disease ([place])    H/O: hemorrhoidectomy    Heart disease    Hx of CABG 10/02/2017   Hyperlipidemia    Hypertension    Mitral regurgitation 07/26/2019   Non-intractable vomiting with nausea 11/09/2019   Permanent atrial fibrillation ([place]) 06/06/2016   Postoperative examination 12/13/2019   Pre-operative cardiovascular examination 10/02/2017   Primary osteoarthritis of left hip 04/20/2018   Added automatically from request for surgery [phone]  Formatting of this note might be different from the original. Added automatically from request for surgery [phone]   Prostate cancer [facility])    Salivary gland calculi    Special screening examination for viral disease 11/09/2019   Toe pain, bilateral 2/62/0355   Umbilical hernia without obstruction and  without gangrene 11/09/2019    Past Surgical History:  Procedure Laterality Date   APPENDECTOMY     CARDIAC BYPASS     REPLACEMENT TOTAL KNEE BILATERAL      Current Medications: Current Meds  Medication Sig   amLODipine (NORVASC) 10 MG tablet Take 10 mg by mouth daily.   ELIQUIS 5 MG  TABS tablet TAKE ONE TABLET BY MOUTH TWICE DAILY   finasteride (PROSCAR) 5 MG tablet Take 5 mg by mouth daily.    folic acid (FOLVITE) 1 MG tablet Take 1 mg by mouth daily.   hydrALAZINE (APRESOLINE) 25 MG tablet Take 25 mg by mouth 2 (two) times daily.   hydrochlorothiazide (HYDRODIURIL) 25 MG tablet Take 1 tablet (25 mg total) by mouth daily.   isosorbide mononitrate (IMDUR) 120 MG 24 hr tablet Take 1 tablet (120 mg total) by mouth daily.   lisinopril (ZESTRIL) 40 MG tablet Take 40 mg by mouth daily.   lovastatin (MEVACOR) 20 MG tablet Take 1 tablet (20 mg total) by mouth daily at 6 PM.   nitroGLYCERIN (NITROSTAT) 0.4 MG SL tablet Place 1 tablet (0.4 mg total) under the tongue every 5 (five) minutes as needed for chest pain.   Omega-3 Fatty Acids (FISH OIL) 1000 MG CAPS Take 1,000 mg by mouth daily.   pantoprazole (PROTONIX) 40 MG tablet Take 40 mg by mouth daily.   thiamine 100 MG tablet Take 100 mg by mouth daily.     Allergies:   Penicillins   Social History   Socioeconomic History   Marital status: Married    Spouse name: Not on file   Number of children: Not on file   Years of education: Not on file   Highest education level: Not on file  Occupational History   Not on file  Tobacco Use   Smoking status: Former   Smokeless tobacco: Never  Vaping Use   Vaping Use: Never used  Substance and Sexual Activity   Alcohol use: No   Drug use: No   Sexual activity: Not on file  Other Topics Concern   Not on file  Social History Narrative   Not on file   Social Determinants of Health   Financial Resource Strain: Not on file  Food Insecurity: Not on file  Transportation Needs: Not on file  Physical Activity: Not on file  Stress: Not on file  Social Connections: Not on file     Family History: The patient's family history includes Cancer in his brother and mother; Heart disease in his brother, father, and sister.  ROS:   Please see the history of present illness.     All other systems reviewed and are negative.  EKGs/Labs/Other Studies Reviewed:    The following studies were reviewed today: Study Highlights      The study is normal. Findings are consistent with no prior ischemia. The study is low risk.   No ST deviation was noted.   Left ventricular function is normal. Nuclear stress EF: 57 %. The left ventricular ejection fraction is normal (55-65%). End diastolic cavity size is normal.   Prior study available for comparison from 07/27/2020. No changes compared to prior study.   Recent Labs: 06/28/2020: Hemoglobin 13.6; Platelets 167 03/29/2021: BUN 20; Creatinine, Ser 1.31; Potassium 4.0; Sodium 137  Recent Lipid Panel    Component Value Date/Time   CHOL 102 08/05/2019 0818   TRIG 60 08/05/2019 0818   HDL 30 (L) 08/05/2019 0818   CHOLHDL 3.4 08/05/2019 0818  [place] 58 08/05/2019 0818    Physical Exam:    VS:  BP 132/80   Pulse 68   Ht 5\' 9"  (1.753 m)   Wt 211 lb 6.4 oz (95.9 kg)   SpO2 98%   BMI 31.22 kg/m     Wt Readings from Last 3 Encounters:  04/10/21 211 lb 6.4 oz (95.9 kg)  03/29/21 206 lb 6.4 oz (93.6 kg)  03/27/21 213 lb (96.6 kg)     GEN: Patient is in no acute distress HEENT: Normal NECK: No JVD; No carotid bruits LYMPHATICS: No lymphadenopathy CARDIAC: Hear sounds regular, 2/6 systolic murmur at the apex. RESPIRATORY:  Clear to auscultation without rales, wheezing or rhonchi  ABDOMEN: Soft, non-tender, non-distended MUSCULOSKELETAL:  No edema; No deformity  SKIN: Warm and dry NEUROLOGIC:  Alert and oriented x 3 PSYCHIATRIC:  Normal affect   Signed, Jenean Lindau, MD  04/10/2021 12:45 PM     [facility]

## 2021-04-10 NOTE — Patient Instructions (Signed)

## 2021-04-17 DIAGNOSIS — M1611 Unilateral primary osteoarthritis, right hip: Secondary | ICD-10-CM | POA: Diagnosis not present

## 2021-04-17 DIAGNOSIS — Z01810 Encounter for preprocedural cardiovascular examination: Secondary | ICD-10-CM | POA: Diagnosis not present

## 2021-04-17 DIAGNOSIS — E119 Type 2 diabetes mellitus without complications: Secondary | ICD-10-CM | POA: Diagnosis not present

## 2021-04-17 DIAGNOSIS — M25559 Pain in unspecified hip: Secondary | ICD-10-CM | POA: Diagnosis not present

## 2021-04-17 DIAGNOSIS — M25551 Pain in right hip: Secondary | ICD-10-CM | POA: Diagnosis not present

## 2021-04-17 DIAGNOSIS — M79609 Pain in unspecified limb: Secondary | ICD-10-CM | POA: Diagnosis not present

## 2021-04-17 DIAGNOSIS — Z22322 Carrier or suspected carrier of Methicillin resistant Staphylococcus aureus: Secondary | ICD-10-CM | POA: Diagnosis not present

## 2021-04-17 DIAGNOSIS — Z01818 Encounter for other preprocedural examination: Secondary | ICD-10-CM | POA: Diagnosis not present

## 2021-04-18 NOTE — Telephone Encounter (Signed)
Patient's appointment for surgery has been moved to 04/30/21.  We received a fax stating they need the clearance as soon as possible.

## 2021-04-19 NOTE — Telephone Encounter (Signed)
   Primary Cardiologist: Jenean Lindau, MD  Chart reviewed as part of pre-operative protocol coverage. Given past medical history and time since last visit, based on ACC/AHA guidelines, WILFRID HYSER would be at acceptable risk for the planned procedure without further cardiovascular testing.   I will route this recommendation to the requesting party via Epic fax function and remove from pre-op pool.  Please call with questions.  Jossie Ng. [place] Mirkin NP-C    04/19/2021, 7:56 AM [address] Office [phone] Fax [phone]

## 2021-04-19 NOTE — Telephone Encounter (Signed)
Faxed Dr. Julien Nordmann office note of clearance and copy of pt's EKG to [facility].

## 2021-04-24 ENCOUNTER — Telehealth: Payer: Self-pay | Admitting: Cardiology

## 2021-04-24 NOTE — Telephone Encounter (Signed)
Refaxed Dr. Ervin Knack office note, 04/10/2021, for surgical clearance to [place] at [phone].

## 2021-04-24 NOTE — Telephone Encounter (Signed)
[facility] calling back to have clearance forwards to their office. Patient surgical date was moved to Oct 31. Please advise

## 2021-04-24 NOTE — Telephone Encounter (Signed)
Covering preop today. Will route back to callback to assist to get contact information - patient saw Dr. Geraldo Pitter 04/10/21 at which time pre-op clearance was addressed (please forward his OV note to requesting party). Thank you. Rollin Kotowski PA-C

## 2021-04-30 DIAGNOSIS — I10 Essential (primary) hypertension: Secondary | ICD-10-CM | POA: Diagnosis not present

## 2021-04-30 DIAGNOSIS — M1611 Unilateral primary osteoarthritis, right hip: Secondary | ICD-10-CM | POA: Insufficient documentation

## 2021-04-30 DIAGNOSIS — I4891 Unspecified atrial fibrillation: Secondary | ICD-10-CM | POA: Diagnosis not present

## 2021-04-30 DIAGNOSIS — Z471 Aftercare following joint replacement surgery: Secondary | ICD-10-CM | POA: Diagnosis not present

## 2021-04-30 DIAGNOSIS — Z96643 Presence of artificial hip joint, bilateral: Secondary | ICD-10-CM | POA: Diagnosis not present

## 2021-04-30 HISTORY — DX: Unilateral primary osteoarthritis, right hip: M16.11

## 2021-05-02 DIAGNOSIS — Z79899 Other long term (current) drug therapy: Secondary | ICD-10-CM | POA: Diagnosis not present

## 2021-05-02 DIAGNOSIS — K219 Gastro-esophageal reflux disease without esophagitis: Secondary | ICD-10-CM | POA: Diagnosis present

## 2021-05-02 DIAGNOSIS — Z955 Presence of coronary angioplasty implant and graft: Secondary | ICD-10-CM | POA: Diagnosis not present

## 2021-05-02 DIAGNOSIS — N400 Enlarged prostate without lower urinary tract symptoms: Secondary | ICD-10-CM | POA: Diagnosis present

## 2021-05-02 DIAGNOSIS — I4821 Permanent atrial fibrillation: Secondary | ICD-10-CM | POA: Diagnosis present

## 2021-05-02 DIAGNOSIS — Z96642 Presence of left artificial hip joint: Secondary | ICD-10-CM | POA: Diagnosis present

## 2021-05-02 DIAGNOSIS — I4891 Unspecified atrial fibrillation: Secondary | ICD-10-CM | POA: Diagnosis not present

## 2021-05-02 DIAGNOSIS — Z7901 Long term (current) use of anticoagulants: Secondary | ICD-10-CM | POA: Diagnosis not present

## 2021-05-02 DIAGNOSIS — I2510 Atherosclerotic heart disease of native coronary artery without angina pectoris: Secondary | ICD-10-CM | POA: Diagnosis present

## 2021-05-02 DIAGNOSIS — E119 Type 2 diabetes mellitus without complications: Secondary | ICD-10-CM | POA: Diagnosis present

## 2021-05-02 DIAGNOSIS — Z96653 Presence of artificial knee joint, bilateral: Secondary | ICD-10-CM | POA: Diagnosis present

## 2021-05-02 DIAGNOSIS — E669 Obesity, unspecified: Secondary | ICD-10-CM | POA: Diagnosis present

## 2021-05-02 DIAGNOSIS — M1611 Unilateral primary osteoarthritis, right hip: Secondary | ICD-10-CM | POA: Diagnosis present

## 2021-05-02 DIAGNOSIS — E785 Hyperlipidemia, unspecified: Secondary | ICD-10-CM | POA: Diagnosis not present

## 2021-05-02 DIAGNOSIS — I10 Essential (primary) hypertension: Secondary | ICD-10-CM | POA: Diagnosis present

## 2021-05-02 DIAGNOSIS — Z6831 Body mass index (BMI) 31.0-31.9, adult: Secondary | ICD-10-CM | POA: Diagnosis not present

## 2021-05-02 DIAGNOSIS — E7800 Pure hypercholesterolemia, unspecified: Secondary | ICD-10-CM | POA: Diagnosis present

## 2021-05-02 DIAGNOSIS — Z87891 Personal history of nicotine dependence: Secondary | ICD-10-CM | POA: Diagnosis not present

## 2021-05-10 DIAGNOSIS — Z96641 Presence of right artificial hip joint: Secondary | ICD-10-CM | POA: Diagnosis not present

## 2021-05-10 DIAGNOSIS — Z471 Aftercare following joint replacement surgery: Secondary | ICD-10-CM | POA: Diagnosis not present

## 2021-05-22 ENCOUNTER — Ambulatory Visit: Payer: Medicare Other | Admitting: Cardiology

## 2021-05-23 DIAGNOSIS — E119 Type 2 diabetes mellitus without complications: Secondary | ICD-10-CM | POA: Diagnosis not present

## 2021-05-30 DIAGNOSIS — I25810 Atherosclerosis of coronary artery bypass graft(s) without angina pectoris: Secondary | ICD-10-CM | POA: Diagnosis not present

## 2021-05-30 DIAGNOSIS — I10 Essential (primary) hypertension: Secondary | ICD-10-CM | POA: Diagnosis not present

## 2021-06-14 DIAGNOSIS — Z471 Aftercare following joint replacement surgery: Secondary | ICD-10-CM | POA: Diagnosis not present

## 2021-06-14 DIAGNOSIS — T161XXA Foreign body in right ear, initial encounter: Secondary | ICD-10-CM | POA: Diagnosis not present

## 2021-06-14 DIAGNOSIS — H6123 Impacted cerumen, bilateral: Secondary | ICD-10-CM | POA: Diagnosis not present

## 2021-06-14 DIAGNOSIS — H9193 Unspecified hearing loss, bilateral: Secondary | ICD-10-CM | POA: Diagnosis not present

## 2021-06-14 DIAGNOSIS — Z96641 Presence of right artificial hip joint: Secondary | ICD-10-CM | POA: Diagnosis not present

## 2021-06-14 DIAGNOSIS — Z974 Presence of external hearing-aid: Secondary | ICD-10-CM | POA: Diagnosis not present

## 2021-06-14 DIAGNOSIS — T162XXA Foreign body in left ear, initial encounter: Secondary | ICD-10-CM | POA: Diagnosis not present

## 2021-06-29 DIAGNOSIS — I10 Essential (primary) hypertension: Secondary | ICD-10-CM | POA: Diagnosis not present

## 2021-06-29 DIAGNOSIS — E785 Hyperlipidemia, unspecified: Secondary | ICD-10-CM | POA: Diagnosis not present

## 2021-06-29 DIAGNOSIS — E119 Type 2 diabetes mellitus without complications: Secondary | ICD-10-CM | POA: Diagnosis not present

## 2021-07-03 ENCOUNTER — Other Ambulatory Visit: Payer: Self-pay | Admitting: Cardiology

## 2021-07-24 DIAGNOSIS — J90 Pleural effusion, not elsewhere classified: Secondary | ICD-10-CM | POA: Diagnosis not present

## 2021-07-24 DIAGNOSIS — R062 Wheezing: Secondary | ICD-10-CM | POA: Diagnosis not present

## 2021-07-24 DIAGNOSIS — J439 Emphysema, unspecified: Secondary | ICD-10-CM | POA: Diagnosis not present

## 2021-07-24 DIAGNOSIS — R0602 Shortness of breath: Secondary | ICD-10-CM | POA: Diagnosis not present

## 2021-07-24 DIAGNOSIS — R0789 Other chest pain: Secondary | ICD-10-CM | POA: Diagnosis not present

## 2021-07-24 DIAGNOSIS — J449 Chronic obstructive pulmonary disease, unspecified: Secondary | ICD-10-CM | POA: Diagnosis not present

## 2021-07-24 DIAGNOSIS — J9811 Atelectasis: Secondary | ICD-10-CM | POA: Diagnosis not present

## 2021-07-24 DIAGNOSIS — R0600 Dyspnea, unspecified: Secondary | ICD-10-CM | POA: Diagnosis not present

## 2021-07-24 DIAGNOSIS — R059 Cough, unspecified: Secondary | ICD-10-CM | POA: Diagnosis not present

## 2021-07-31 DIAGNOSIS — R600 Localized edema: Secondary | ICD-10-CM | POA: Diagnosis not present

## 2021-08-07 DIAGNOSIS — I10 Essential (primary) hypertension: Secondary | ICD-10-CM | POA: Diagnosis not present

## 2021-08-07 DIAGNOSIS — M7989 Other specified soft tissue disorders: Secondary | ICD-10-CM | POA: Diagnosis not present

## 2021-08-07 DIAGNOSIS — E119 Type 2 diabetes mellitus without complications: Secondary | ICD-10-CM | POA: Diagnosis not present

## 2021-08-07 DIAGNOSIS — I872 Venous insufficiency (chronic) (peripheral): Secondary | ICD-10-CM | POA: Diagnosis not present

## 2021-08-09 DIAGNOSIS — C61 Malignant neoplasm of prostate: Secondary | ICD-10-CM | POA: Diagnosis not present

## 2021-08-09 DIAGNOSIS — R351 Nocturia: Secondary | ICD-10-CM | POA: Diagnosis not present

## 2021-08-20 DIAGNOSIS — R2243 Localized swelling, mass and lump, lower limb, bilateral: Secondary | ICD-10-CM | POA: Diagnosis not present

## 2021-08-20 DIAGNOSIS — R590 Localized enlarged lymph nodes: Secondary | ICD-10-CM | POA: Diagnosis not present

## 2021-08-20 DIAGNOSIS — M79604 Pain in right leg: Secondary | ICD-10-CM | POA: Diagnosis not present

## 2021-08-20 DIAGNOSIS — M79605 Pain in left leg: Secondary | ICD-10-CM | POA: Diagnosis not present

## 2021-08-23 ENCOUNTER — Telehealth: Payer: Self-pay | Admitting: Cardiology

## 2021-08-23 NOTE — Telephone Encounter (Signed)
I have a copy of his BNP placed in your folder for review. Please advise.

## 2021-08-23 NOTE — Telephone Encounter (Signed)
Patient states about 2 weeks ago he had labs done at his PCP and they said it showed he has fluid around his heart. He says they told him they would send the results to Dr. Bettina Gavia, but he says he sees Dr. Geraldo Pitter and has not heard anything yet.

## 2021-08-24 DIAGNOSIS — I8310 Varicose veins of unspecified lower extremity with inflammation: Secondary | ICD-10-CM | POA: Diagnosis not present

## 2021-08-24 DIAGNOSIS — L821 Other seborrheic keratosis: Secondary | ICD-10-CM | POA: Diagnosis not present

## 2021-08-24 DIAGNOSIS — L570 Actinic keratosis: Secondary | ICD-10-CM | POA: Diagnosis not present

## 2021-08-24 DIAGNOSIS — L299 Pruritus, unspecified: Secondary | ICD-10-CM | POA: Diagnosis not present

## 2021-08-24 DIAGNOSIS — C44319 Basal cell carcinoma of skin of other parts of face: Secondary | ICD-10-CM | POA: Diagnosis not present

## 2021-10-04 DIAGNOSIS — K2270 Barrett's esophagus without dysplasia: Secondary | ICD-10-CM | POA: Diagnosis not present

## 2021-10-08 ENCOUNTER — Other Ambulatory Visit: Payer: Self-pay | Admitting: Cardiology

## 2021-10-08 NOTE — Telephone Encounter (Signed)
Prescription refill request for Eliquis received. ?Indication:Afib ?Last office visit:10/22 ?Scr:1.1 ?Age: 87 ?Weight:95.9 kg ? ?Prescription refilled ? ?

## 2021-10-11 DIAGNOSIS — Z96641 Presence of right artificial hip joint: Secondary | ICD-10-CM | POA: Diagnosis not present

## 2021-10-11 DIAGNOSIS — Z471 Aftercare following joint replacement surgery: Secondary | ICD-10-CM | POA: Diagnosis not present

## 2021-10-17 ENCOUNTER — Other Ambulatory Visit: Payer: Self-pay

## 2021-10-17 DIAGNOSIS — K219 Gastro-esophageal reflux disease without esophagitis: Secondary | ICD-10-CM

## 2021-10-17 DIAGNOSIS — E119 Type 2 diabetes mellitus without complications: Secondary | ICD-10-CM

## 2021-10-17 DIAGNOSIS — N400 Enlarged prostate without lower urinary tract symptoms: Secondary | ICD-10-CM | POA: Insufficient documentation

## 2021-10-17 DIAGNOSIS — E7800 Pure hypercholesterolemia, unspecified: Secondary | ICD-10-CM

## 2021-10-17 HISTORY — DX: Pure hypercholesterolemia, unspecified: E78.00

## 2021-10-17 HISTORY — DX: Benign prostatic hyperplasia without lower urinary tract symptoms: N40.0

## 2021-10-17 HISTORY — DX: Gastro-esophageal reflux disease without esophagitis: K21.9

## 2021-10-17 HISTORY — DX: Type 2 diabetes mellitus without complications: E11.9

## 2021-10-22 ENCOUNTER — Ambulatory Visit (INDEPENDENT_AMBULATORY_CARE_PROVIDER_SITE_OTHER): Payer: Medicare Other | Admitting: Cardiology

## 2021-10-22 ENCOUNTER — Encounter: Payer: Self-pay | Admitting: Cardiology

## 2021-10-22 VITALS — BP 130/70 | HR 58 | Ht 69.0 in | Wt 203.8 lb

## 2021-10-22 DIAGNOSIS — I4821 Permanent atrial fibrillation: Secondary | ICD-10-CM | POA: Diagnosis not present

## 2021-10-22 DIAGNOSIS — I10 Essential (primary) hypertension: Secondary | ICD-10-CM

## 2021-10-22 DIAGNOSIS — E785 Hyperlipidemia, unspecified: Secondary | ICD-10-CM

## 2021-10-22 DIAGNOSIS — I25118 Atherosclerotic heart disease of native coronary artery with other forms of angina pectoris: Secondary | ICD-10-CM

## 2021-10-22 DIAGNOSIS — Z951 Presence of aortocoronary bypass graft: Secondary | ICD-10-CM | POA: Diagnosis not present

## 2021-10-22 MED ORDER — FUROSEMIDE 40 MG PO TABS: 40.0000 mg | ORAL_TABLET | Freq: Every day | ORAL | 0 refills | Status: DC | PRN

## 2021-10-22 NOTE — Patient Instructions (Signed)

## 2021-10-22 NOTE — Progress Notes (Signed)
?Cardiology Office Note:   ? ?Date:  10/22/2021  ? ?ID:  Todd Sampson, DOB [birth year], MRN [id number] ? ?PCP:  Townsend Roger, MD  ?Cardiologist:  Jenean Lindau, MD  ? ?Referring MD: Townsend Roger, MD  ? ? ?ASSESSMENT:   ? ?1. Permanent atrial fibrillation ([place])   ?2. Essential hypertension   ?3. Coronary artery disease of native artery of native heart with stable angina pectoris ([place])   ?4. Hx of CABG   ?5. Dyslipidemia (high LDL; low HDL)   ? ?PLAN:   ? ?In order of problems listed above: ? ?Coronary artery disease: Secondary prevention stressed with the patient.  Importance of compliance with diet medication stressed any vocalized understanding.  He was advised to walk on a regular basis at least half an hour.  His dyspnea on exertion has resolved completely.  He is not on diuretic therapy ?Congestive heart failure: Diet was emphasized.  Lifestyle modification and salt intake issues were discussed.  I gave him furosemide 40 mg 10 tablets to keep handy and to take it on a as needed basis if he develops the symptoms and get back to [place] about it.  He understands. ?Essential hypertension: Blood pressure stable diet was emphasized.  Lifestyle modification urged. ?Diabetes mellitus and obesity: Managed by primary care.  Diet stressed. ?Patient will be seen in follow-up appointment in 6 months or earlier if the patient has any concerns ? ? ? ?Medication Adjustments/Labs and Tests Ordered: ?Current medicines are reviewed at length with the patient today.  Concerns regarding medicines are outlined above.  ?No orders of the defined types were placed in this encounter. ? ?No orders of the defined types were placed in this encounter. ? ? ? ?No chief complaint on file. ?  ? ?History of Present Illness:   ? ?Todd Sampson is a 87 y.o. male.  Patient has past medical history of coronary artery disease post CABG surgery, essential hypertension, dyslipidemia and diabetes mellitus.  He has permanent atrial fibrillation.  He  denies any problems at this time and takes care of activities of daily living.  No chest pain orthopnea or PND.  He recently had congestive heart failure-like symptoms in the month of January February and was given diuretics and he is done with those diuretics which he used for about a week and currently has no symptoms.  No chest pain orthopnea PND.  At the time of my evaluation, the patient is alert awake oriented and in no distress. ? ?Past Medical History:  ?Diagnosis Date  ? Anemia   ? Barrett's esophagus   ? BMI 32.0-32.9,adult 11/09/2019  ? BPH (benign prostatic hyperplasia) 10/17/2021  ? Calculus of gallbladder with chronic cholecystitis without obstruction 11/09/2019  ? Coronary artery disease of native artery of native heart with stable angina pectoris ([place]) 06/06/2016  ? Diet-controlled diabetes mellitus ([place]) 06/06/2016  ? Dyslipidemia (high LDL; low HDL) 06/06/2016  ? Essential hypertension 06/06/2016  ? GERD (gastroesophageal reflux disease) 10/17/2021  ? Guillain-Barre disease ([place])   ? H/O: hemorrhoidectomy   ? Heart disease   ? Hx of CABG 10/02/2017  ? Hypercholesteremia 10/17/2021  ? Hyperlipidemia   ? Hypertension   ? Mitral regurgitation 07/26/2019  ? Non-intractable vomiting with nausea 11/09/2019  ? Permanent atrial fibrillation ([place]) 06/06/2016  ? Postoperative examination 12/13/2019  ? Preoperative cardiovascular examination 10/02/2017  ? Primary osteoarthritis of left hip 04/20/2018  ? Added automatically from request for surgery [phone]  Formatting of this note might  be different from the original. Added automatically from request for surgery [phone]  ? Prostate cancer ([place])   ? Salivary gland calculi   ? Special screening examination for viral disease 11/09/2019  ? Toe pain, bilateral 09/10/2019  ? Type 2 diabetes, diet controlled ([place]) 10/17/2021  ? Umbilical hernia without obstruction and without gangrene 11/09/2019  ? Unilateral primary osteoarthritis, right hip 04/30/2021  ? ? ?Past Surgical  History:  ?Procedure Laterality Date  ? APPENDECTOMY    ? CARDIAC BYPASS    ? REPLACEMENT TOTAL KNEE BILATERAL    ? ? ?Current Medications: ?Current Meds  ?Medication Sig  ? amLODipine (NORVASC) 10 MG tablet Take 10 mg by mouth daily.  ? ELIQUIS 5 MG TABS tablet TAKE ONE TABLET BY MOUTH TWICE DAILY  ? finasteride (PROSCAR) 5 MG tablet Take 5 mg by mouth daily.   ? folic acid (FOLVITE) 1 MG tablet Take 1 mg by mouth daily.  ? hydrALAZINE (APRESOLINE) 25 MG tablet Take 25 mg by mouth 2 (two) times daily.  ? hydrochlorothiazide (HYDRODIURIL) 25 MG tablet Take 25 mg by mouth daily.  ? isosorbide mononitrate (IMDUR) 120 MG 24 hr tablet Take 1 tablet (120 mg total) by mouth daily.  ? lisinopril (ZESTRIL) 40 MG tablet Take 40 mg by mouth daily.  ? lovastatin (MEVACOR) 20 MG tablet Take 1 tablet (20 mg total) by mouth daily at 6 PM.  ? nitroGLYCERIN (NITROSTAT) 0.4 MG SL tablet Place 0.4 mg under the tongue every 5 (five) minutes as needed for chest pain.  ? Omega-3 Fatty Acids (FISH OIL) 1000 MG CAPS Take 1,000 mg by mouth daily.  ? pantoprazole (PROTONIX) 40 MG tablet Take 40 mg by mouth daily.  ? thiamine 100 MG tablet Take 100 mg by mouth daily.  ?  ? ?Allergies:   Penicillins  ? ?Social History  ? ?Socioeconomic History  ? Marital status: Married  ?  Spouse name: Not on file  ? Number of children: Not on file  ? Years of education: Not on file  ? Highest education level: Not on file  ?Occupational History  ? Not on file  ?Tobacco Use  ? Smoking status: Former  ? Smokeless tobacco: Never  ?Vaping Use  ? Vaping Use: Never used  ?Substance and Sexual Activity  ? Alcohol use: No  ? Drug use: No  ? Sexual activity: Not on file  ?Other Topics Concern  ? Not on file  ?Social History Narrative  ? Not on file  ? ?Social Determinants of Health  ? ?Financial Resource Strain: Not on file  ?Food Insecurity: Not on file  ?Transportation Needs: Not on file  ?Physical Activity: Not on file  ?Stress: Not on file  ?Social Connections:  Not on file  ?  ? ?Family History: ?The patient's family history includes Cancer in his brother and mother; Heart disease in his brother, father, and sister. ? ?ROS:   ?Please see the history of present illness.    ?All other systems reviewed and are negative. ? ?EKGs/Labs/Other Studies Reviewed:   ? ?The following studies were reviewed today: ?I discussed my findings with the patient at length ? ? ?Recent Labs: ?03/29/2021: BUN 20; Creatinine, Ser 1.31; Potassium 4.0; Sodium 137  ?Recent Lipid Panel ?   ?Component Value Date/Time  ? CHOL 102 08/05/2019 0818  ? TRIG 60 08/05/2019 0818  ? HDL 30 (L) 08/05/2019 0818  ? CHOLHDL 3.4 08/05/2019 0818  ? [place] 58 08/05/2019 0818  ? ? ?Physical Exam:   ? ?  VS:  BP 130/70   Pulse (!) 58   Ht '5\' 9"'$  (1.753 m)   Wt 203 lb 12.8 oz (92.4 kg)   SpO2 97%   BMI 30.10 kg/m?    ? ?Wt Readings from Last 3 Encounters:  ?10/22/21 203 lb 12.8 oz (92.4 kg)  ?04/10/21 211 lb 6.4 oz (95.9 kg)  ?03/29/21 206 lb 6.4 oz (93.6 kg)  ?  ? ?GEN: Patient is in no acute distress ?HEENT: Normal ?NECK: No JVD; No carotid bruits ?LYMPHATICS: No lymphadenopathy ?CARDIAC: Hear sounds regular, 2/6 systolic murmur at the apex. ?RESPIRATORY:  Clear to auscultation without rales, wheezing or rhonchi  ?ABDOMEN: Soft, non-tender, non-distended ?MUSCULOSKELETAL:  No edema; No deformity  ?SKIN: Warm and dry ?NEUROLOGIC:  Alert and oriented x 3 ?PSYCHIATRIC:  Normal affect  ? ?Signed, ?Jenean Lindau, MD  ?10/22/2021 1:37 PM    ?[place]  ?

## 2021-11-05 DIAGNOSIS — I10 Essential (primary) hypertension: Secondary | ICD-10-CM | POA: Diagnosis not present

## 2021-11-05 DIAGNOSIS — E119 Type 2 diabetes mellitus without complications: Secondary | ICD-10-CM | POA: Diagnosis not present

## 2021-11-13 DIAGNOSIS — M12812 Other specific arthropathies, not elsewhere classified, left shoulder: Secondary | ICD-10-CM | POA: Diagnosis not present

## 2021-11-13 DIAGNOSIS — M75102 Unspecified rotator cuff tear or rupture of left shoulder, not specified as traumatic: Secondary | ICD-10-CM | POA: Diagnosis not present

## 2021-11-14 DIAGNOSIS — K22719 Barrett's esophagus with dysplasia, unspecified: Secondary | ICD-10-CM | POA: Diagnosis not present

## 2021-11-14 DIAGNOSIS — K2270 Barrett's esophagus without dysplasia: Secondary | ICD-10-CM | POA: Diagnosis not present

## 2021-12-03 ENCOUNTER — Other Ambulatory Visit: Payer: Self-pay | Admitting: Cardiology

## 2021-12-03 ENCOUNTER — Telehealth: Payer: Self-pay

## 2021-12-03 DIAGNOSIS — Z880 Allergy status to penicillin: Secondary | ICD-10-CM | POA: Diagnosis not present

## 2021-12-03 DIAGNOSIS — E7800 Pure hypercholesterolemia, unspecified: Secondary | ICD-10-CM | POA: Diagnosis not present

## 2021-12-03 DIAGNOSIS — E785 Hyperlipidemia, unspecified: Secondary | ICD-10-CM | POA: Diagnosis not present

## 2021-12-03 DIAGNOSIS — K2270 Barrett's esophagus without dysplasia: Secondary | ICD-10-CM | POA: Diagnosis not present

## 2021-12-03 DIAGNOSIS — I25710 Atherosclerosis of autologous vein coronary artery bypass graft(s) with unstable angina pectoris: Secondary | ICD-10-CM | POA: Diagnosis not present

## 2021-12-03 DIAGNOSIS — Z5321 Procedure and treatment not carried out due to patient leaving prior to being seen by health care provider: Secondary | ICD-10-CM | POA: Diagnosis not present

## 2021-12-03 DIAGNOSIS — J811 Chronic pulmonary edema: Secondary | ICD-10-CM | POA: Diagnosis not present

## 2021-12-03 DIAGNOSIS — I4821 Permanent atrial fibrillation: Secondary | ICD-10-CM | POA: Diagnosis not present

## 2021-12-03 DIAGNOSIS — I10 Essential (primary) hypertension: Secondary | ICD-10-CM | POA: Diagnosis present

## 2021-12-03 DIAGNOSIS — I200 Unstable angina: Secondary | ICD-10-CM | POA: Diagnosis not present

## 2021-12-03 DIAGNOSIS — Z7901 Long term (current) use of anticoagulants: Secondary | ICD-10-CM | POA: Diagnosis not present

## 2021-12-03 DIAGNOSIS — I700 Atherosclerosis of aorta: Secondary | ICD-10-CM | POA: Diagnosis not present

## 2021-12-03 DIAGNOSIS — I77810 Thoracic aortic ectasia: Secondary | ICD-10-CM | POA: Diagnosis not present

## 2021-12-03 DIAGNOSIS — M1990 Unspecified osteoarthritis, unspecified site: Secondary | ICD-10-CM | POA: Diagnosis present

## 2021-12-03 DIAGNOSIS — I493 Ventricular premature depolarization: Secondary | ICD-10-CM | POA: Diagnosis not present

## 2021-12-03 DIAGNOSIS — I214 Non-ST elevation (NSTEMI) myocardial infarction: Secondary | ICD-10-CM | POA: Diagnosis not present

## 2021-12-03 DIAGNOSIS — I25700 Atherosclerosis of coronary artery bypass graft(s), unspecified, with unstable angina pectoris: Secondary | ICD-10-CM | POA: Diagnosis not present

## 2021-12-03 DIAGNOSIS — I495 Sick sinus syndrome: Secondary | ICD-10-CM | POA: Diagnosis not present

## 2021-12-03 DIAGNOSIS — I119 Hypertensive heart disease without heart failure: Secondary | ICD-10-CM | POA: Diagnosis not present

## 2021-12-03 DIAGNOSIS — I208 Other forms of angina pectoris: Secondary | ICD-10-CM | POA: Diagnosis not present

## 2021-12-03 DIAGNOSIS — Z7982 Long term (current) use of aspirin: Secondary | ICD-10-CM | POA: Diagnosis not present

## 2021-12-03 DIAGNOSIS — C61 Malignant neoplasm of prostate: Secondary | ICD-10-CM | POA: Diagnosis not present

## 2021-12-03 DIAGNOSIS — Z951 Presence of aortocoronary bypass graft: Secondary | ICD-10-CM | POA: Diagnosis not present

## 2021-12-03 DIAGNOSIS — Z79899 Other long term (current) drug therapy: Secondary | ICD-10-CM | POA: Diagnosis not present

## 2021-12-03 DIAGNOSIS — R001 Bradycardia, unspecified: Secondary | ICD-10-CM | POA: Diagnosis not present

## 2021-12-03 DIAGNOSIS — I482 Chronic atrial fibrillation, unspecified: Secondary | ICD-10-CM | POA: Diagnosis not present

## 2021-12-03 DIAGNOSIS — I080 Rheumatic disorders of both mitral and aortic valves: Secondary | ICD-10-CM | POA: Diagnosis not present

## 2021-12-03 DIAGNOSIS — G610 Guillain-Barre syndrome: Secondary | ICD-10-CM | POA: Diagnosis not present

## 2021-12-03 DIAGNOSIS — R079 Chest pain, unspecified: Secondary | ICD-10-CM | POA: Diagnosis not present

## 2021-12-03 DIAGNOSIS — R0902 Hypoxemia: Secondary | ICD-10-CM | POA: Diagnosis not present

## 2021-12-03 DIAGNOSIS — K219 Gastro-esophageal reflux disease without esophagitis: Secondary | ICD-10-CM | POA: Diagnosis present

## 2021-12-03 DIAGNOSIS — Z87891 Personal history of nicotine dependence: Secondary | ICD-10-CM | POA: Diagnosis not present

## 2021-12-03 DIAGNOSIS — I2589 Other forms of chronic ischemic heart disease: Secondary | ICD-10-CM | POA: Diagnosis not present

## 2021-12-03 DIAGNOSIS — G4734 Idiopathic sleep related nonobstructive alveolar hypoventilation: Secondary | ICD-10-CM | POA: Diagnosis not present

## 2021-12-03 DIAGNOSIS — I25110 Atherosclerotic heart disease of native coronary artery with unstable angina pectoris: Secondary | ICD-10-CM | POA: Diagnosis not present

## 2021-12-03 DIAGNOSIS — I161 Hypertensive emergency: Secondary | ICD-10-CM | POA: Diagnosis not present

## 2021-12-03 NOTE — Telephone Encounter (Signed)
Patient came by the [facility] office and asked to speak to one of the nurses. After speaking to the patient he stated that he had been having chest pain that radiated down his left arm and went up his neck for the entire week. He had been taking nitro the entire week but the chest pain would always come back. Today he came to the [place] office looking to be seen by Dr. Geraldo Pitter. He stated that he was still having the same symptoms he had been having all week and he had already taken three nitro today. Based on these symptoms I recommended that the patient go to the ER to be evaluated. He was hesitant to go to the ER due to the long wait times, but after explaining the reasoning with him based on his symptoms he agreed and stated that he would go to the ER.

## 2021-12-03 NOTE — Telephone Encounter (Signed)
Rx refill sent to pharmacy. 

## 2021-12-04 ENCOUNTER — Inpatient Hospital Stay (HOSPITAL_COMMUNITY)
Admission: EM | Admit: 2021-12-04 | Discharge: 2021-12-06 | DRG: 247 | Disposition: A | Discharge status: Home / Self Care | Payer: Medicare Other | Source: Other Acute Inpatient Hospital | Attending: Interventional Cardiology | Admitting: Interventional Cardiology

## 2021-12-04 ENCOUNTER — Encounter (HOSPITAL_COMMUNITY)
Admission: EM | Disposition: A | Discharge status: Home / Self Care | Payer: Self-pay | Source: Other Acute Inpatient Hospital | Attending: Interventional Cardiology

## 2021-12-04 ENCOUNTER — Encounter (HOSPITAL_COMMUNITY): Payer: Self-pay | Admitting: Interventional Cardiology

## 2021-12-04 DIAGNOSIS — I482 Chronic atrial fibrillation, unspecified: Secondary | ICD-10-CM

## 2021-12-04 DIAGNOSIS — E785 Hyperlipidemia, unspecified: Secondary | ICD-10-CM

## 2021-12-04 DIAGNOSIS — R001 Bradycardia, unspecified: Secondary | ICD-10-CM | POA: Diagnosis present

## 2021-12-04 DIAGNOSIS — I25110 Atherosclerotic heart disease of native coronary artery with unstable angina pectoris: Secondary | ICD-10-CM | POA: Diagnosis not present

## 2021-12-04 DIAGNOSIS — I25710 Atherosclerosis of autologous vein coronary artery bypass graft(s) with unstable angina pectoris: Secondary | ICD-10-CM

## 2021-12-04 DIAGNOSIS — Z79899 Other long term (current) drug therapy: Secondary | ICD-10-CM | POA: Diagnosis not present

## 2021-12-04 DIAGNOSIS — R0902 Hypoxemia: Secondary | ICD-10-CM | POA: Diagnosis present

## 2021-12-04 DIAGNOSIS — I493 Ventricular premature depolarization: Secondary | ICD-10-CM | POA: Diagnosis present

## 2021-12-04 DIAGNOSIS — K2270 Barrett's esophagus without dysplasia: Secondary | ICD-10-CM | POA: Diagnosis present

## 2021-12-04 DIAGNOSIS — I495 Sick sinus syndrome: Secondary | ICD-10-CM | POA: Diagnosis not present

## 2021-12-04 DIAGNOSIS — I77810 Thoracic aortic ectasia: Secondary | ICD-10-CM | POA: Diagnosis present

## 2021-12-04 DIAGNOSIS — G610 Guillain-Barre syndrome: Secondary | ICD-10-CM | POA: Diagnosis not present

## 2021-12-04 DIAGNOSIS — I4821 Permanent atrial fibrillation: Secondary | ICD-10-CM | POA: Diagnosis present

## 2021-12-04 DIAGNOSIS — Z7901 Long term (current) use of anticoagulants: Secondary | ICD-10-CM

## 2021-12-04 DIAGNOSIS — E7800 Pure hypercholesterolemia, unspecified: Secondary | ICD-10-CM | POA: Diagnosis present

## 2021-12-04 DIAGNOSIS — Z955 Presence of coronary angioplasty implant and graft: Principal | ICD-10-CM

## 2021-12-04 DIAGNOSIS — Z7982 Long term (current) use of aspirin: Secondary | ICD-10-CM | POA: Diagnosis not present

## 2021-12-04 DIAGNOSIS — I200 Unstable angina: Secondary | ICD-10-CM | POA: Diagnosis not present

## 2021-12-04 DIAGNOSIS — I161 Hypertensive emergency: Secondary | ICD-10-CM | POA: Diagnosis not present

## 2021-12-04 DIAGNOSIS — C61 Malignant neoplasm of prostate: Secondary | ICD-10-CM | POA: Diagnosis present

## 2021-12-04 DIAGNOSIS — I119 Hypertensive heart disease without heart failure: Secondary | ICD-10-CM

## 2021-12-04 DIAGNOSIS — I214 Non-ST elevation (NSTEMI) myocardial infarction: Secondary | ICD-10-CM | POA: Diagnosis present

## 2021-12-04 DIAGNOSIS — I25700 Atherosclerosis of coronary artery bypass graft(s), unspecified, with unstable angina pectoris: Secondary | ICD-10-CM | POA: Diagnosis not present

## 2021-12-04 DIAGNOSIS — I080 Rheumatic disorders of both mitral and aortic valves: Secondary | ICD-10-CM | POA: Diagnosis present

## 2021-12-04 DIAGNOSIS — Z951 Presence of aortocoronary bypass graft: Secondary | ICD-10-CM

## 2021-12-04 DIAGNOSIS — I2589 Other forms of chronic ischemic heart disease: Secondary | ICD-10-CM | POA: Diagnosis not present

## 2021-12-04 DIAGNOSIS — G4734 Idiopathic sleep related nonobstructive alveolar hypoventilation: Secondary | ICD-10-CM | POA: Diagnosis not present

## 2021-12-04 HISTORY — PX: LEFT HEART CATH AND CORS/GRAFTS ANGIOGRAPHY: CATH118250

## 2021-12-04 HISTORY — PX: CORONARY STENT INTERVENTION: CATH118234

## 2021-12-04 HISTORY — DX: Atherosclerotic heart disease of native coronary artery with unstable angina pectoris: I25.110

## 2021-12-04 LAB — GLUCOSE, CAPILLARY: Glucose-Capillary: 126 mg/dL — ABNORMAL HIGH (ref 70–99)

## 2021-12-04 LAB — POCT ACTIVATED CLOTTING TIME
Activated Clotting Time: 630 seconds
Activated Clotting Time: 371 seconds

## 2021-12-04 LAB — MRSA NEXT GEN BY PCR, NASAL: MRSA by PCR Next Gen: NOT DETECTED

## 2021-12-04 SURGERY — LEFT HEART CATH AND CORS/GRAFTS ANGIOGRAPHY
Anesthesia: LOCAL

## 2021-12-04 MED ORDER — MIDAZOLAM HCL 2 MG/2ML IJ SOLN
INTRAMUSCULAR | Status: DC | PRN
  Administered 2021-12-04: 1 mg via INTRAVENOUS

## 2021-12-04 MED ORDER — LABETALOL HCL 5 MG/ML IV SOLN: 10.0000 mg | INTRAVENOUS | Status: AC | PRN

## 2021-12-04 MED ORDER — ATROPINE SULFATE 1 MG/10ML IJ SOSY
PREFILLED_SYRINGE | INTRAMUSCULAR | Status: AC
  Filled 2021-12-04: qty 10

## 2021-12-04 MED ORDER — BIVALIRUDIN BOLUS VIA INFUSION - CUPID
INTRAVENOUS | Status: DC | PRN
  Administered 2021-12-04: 69.75 mg via INTRAVENOUS

## 2021-12-04 MED ORDER — MIDAZOLAM HCL 2 MG/2ML IJ SOLN
INTRAMUSCULAR | Status: AC
  Filled 2021-12-04: qty 2

## 2021-12-04 MED ORDER — FOLIC ACID 1 MG PO TABS
1.0000 mg | ORAL_TABLET | Freq: Every day | ORAL | Status: DC
  Administered 2021-12-05 – 2021-12-06 (×2): 1 mg via ORAL
  Filled 2021-12-04 (×2): qty 1

## 2021-12-04 MED ORDER — SODIUM CHLORIDE 0.9% FLUSH
3.0000 mL | Freq: Two times a day (BID) | INTRAVENOUS | Status: DC
  Administered 2021-12-04 – 2021-12-06 (×4): 3 mL via INTRAVENOUS

## 2021-12-04 MED ORDER — SODIUM CHLORIDE 0.9% FLUSH: 3.0000 mL | INTRAVENOUS | Status: DC | PRN

## 2021-12-04 MED ORDER — SODIUM CHLORIDE 0.9 % IV SOLN: INTRAVENOUS | Status: AC

## 2021-12-04 MED ORDER — LIDOCAINE HCL (PF) 1 % IJ SOLN
INTRAMUSCULAR | Status: AC
  Filled 2021-12-04: qty 30

## 2021-12-04 MED ORDER — HEPARIN (PORCINE) IN NACL 2000-0.9 UNIT/L-% IV SOLN
INTRAVENOUS | Status: AC
  Filled 2021-12-04: qty 1000

## 2021-12-04 MED ORDER — TICAGRELOR 90 MG PO TABS
ORAL_TABLET | ORAL | Status: DC | PRN
  Administered 2021-12-04: 180 mg via ORAL

## 2021-12-04 MED ORDER — CLOPIDOGREL BISULFATE 75 MG PO TABS
75.0000 mg | ORAL_TABLET | Freq: Every day | ORAL | Status: DC
  Administered 2021-12-06: 75 mg via ORAL
  Filled 2021-12-04: qty 1

## 2021-12-04 MED ORDER — ATROPINE SULFATE 1 MG/10ML IJ SOSY
PREFILLED_SYRINGE | INTRAMUSCULAR | Status: DC | PRN
  Administered 2021-12-04: .5 mg via INTRAVENOUS

## 2021-12-04 MED ORDER — VERAPAMIL HCL 2.5 MG/ML IV SOLN
INTRAVENOUS | Status: AC
  Filled 2021-12-04: qty 2

## 2021-12-04 MED ORDER — ACETAMINOPHEN 325 MG PO TABS: 650.0000 mg | ORAL_TABLET | ORAL | Status: DC | PRN

## 2021-12-04 MED ORDER — FENTANYL CITRATE (PF) 100 MCG/2ML IJ SOLN
INTRAMUSCULAR | Status: AC
  Filled 2021-12-04: qty 2

## 2021-12-04 MED ORDER — HYDRALAZINE HCL 20 MG/ML IJ SOLN
10.0000 mg | INTRAMUSCULAR | Status: AC | PRN
  Administered 2021-12-04: 10 mg via INTRAVENOUS
  Filled 2021-12-04 (×2): qty 1

## 2021-12-04 MED ORDER — PANTOPRAZOLE SODIUM 40 MG PO TBEC
40.0000 mg | DELAYED_RELEASE_TABLET | Freq: Every day | ORAL | Status: DC
  Administered 2021-12-05 – 2021-12-06 (×2): 40 mg via ORAL
  Filled 2021-12-04 (×2): qty 1

## 2021-12-04 MED ORDER — SODIUM CHLORIDE 0.9 % IV SOLN
INTRAVENOUS | Status: DC | PRN
  Administered 2021-12-04: 1.75 mg/kg/h via INTRAVENOUS

## 2021-12-04 MED ORDER — LIDOCAINE HCL (PF) 1 % IJ SOLN
INTRAMUSCULAR | Status: DC | PRN
  Administered 2021-12-04: 15 mL

## 2021-12-04 MED ORDER — FINASTERIDE 5 MG PO TABS
5.0000 mg | ORAL_TABLET | Freq: Every day | ORAL | Status: DC
  Administered 2021-12-05 – 2021-12-06 (×2): 5 mg via ORAL
  Filled 2021-12-04 (×2): qty 1

## 2021-12-04 MED ORDER — HEPARIN (PORCINE) IN NACL 2000-0.9 UNIT/L-% IV SOLN
INTRAVENOUS | Status: DC | PRN
  Administered 2021-12-04 (×2): 1000 mL

## 2021-12-04 MED ORDER — NITROGLYCERIN 0.4 MG SL SUBL: 0.4000 mg | SUBLINGUAL_TABLET | SUBLINGUAL | Status: DC | PRN

## 2021-12-04 MED ORDER — HYDRALAZINE HCL 25 MG PO TABS
25.0000 mg | ORAL_TABLET | Freq: Two times a day (BID) | ORAL | Status: DC
  Administered 2021-12-04 – 2021-12-06 (×4): 25 mg via ORAL
  Filled 2021-12-04 (×4): qty 1

## 2021-12-04 MED ORDER — AMLODIPINE BESYLATE 10 MG PO TABS
10.0000 mg | ORAL_TABLET | Freq: Every day | ORAL | Status: DC
  Administered 2021-12-05 – 2021-12-06 (×2): 10 mg via ORAL
  Filled 2021-12-04 (×2): qty 1

## 2021-12-04 MED ORDER — SODIUM CHLORIDE 0.9 % IV SOLN: 250.0000 mL | INTRAVENOUS | Status: DC | PRN

## 2021-12-04 MED ORDER — FENTANYL CITRATE (PF) 100 MCG/2ML IJ SOLN
INTRAMUSCULAR | Status: DC | PRN
  Administered 2021-12-04 (×2): 25 ug via INTRAVENOUS

## 2021-12-04 MED ORDER — CHLORHEXIDINE GLUCONATE CLOTH 2 % EX PADS
6.0000 | MEDICATED_PAD | Freq: Every day | CUTANEOUS | Status: DC
  Administered 2021-12-04 – 2021-12-06 (×3): 6 via TOPICAL

## 2021-12-04 MED ORDER — CLOPIDOGREL BISULFATE 300 MG PO TABS
300.0000 mg | ORAL_TABLET | Freq: Once | ORAL | Status: AC
  Administered 2021-12-05: 300 mg via ORAL
  Filled 2021-12-04: qty 1

## 2021-12-04 MED ORDER — SODIUM CHLORIDE 0.9 % IV SOLN
1.7500 mg/kg/h | INTRAVENOUS | Status: DC
  Administered 2021-12-04: 1.75 mg/kg/h via INTRAVENOUS
  Filled 2021-12-04 (×2): qty 250

## 2021-12-04 MED ORDER — IOHEXOL 350 MG/ML SOLN
INTRAVENOUS | Status: DC | PRN
  Administered 2021-12-04: 140 mL via INTRA_ARTERIAL

## 2021-12-04 MED ORDER — ASPIRIN 81 MG PO CHEW
81.0000 mg | CHEWABLE_TABLET | Freq: Every day | ORAL | Status: DC
  Administered 2021-12-05 – 2021-12-06 (×2): 81 mg via ORAL
  Filled 2021-12-04 (×2): qty 1

## 2021-12-04 MED ORDER — ISOSORBIDE MONONITRATE ER 60 MG PO TB24
120.0000 mg | ORAL_TABLET | Freq: Every day | ORAL | Status: DC
  Administered 2021-12-05 – 2021-12-06 (×2): 120 mg via ORAL
  Filled 2021-12-04 (×2): qty 2

## 2021-12-04 MED ORDER — ONDANSETRON HCL 4 MG/2ML IJ SOLN: 4.0000 mg | Freq: Four times a day (QID) | INTRAMUSCULAR | Status: DC | PRN

## 2021-12-04 MED ORDER — VERAPAMIL HCL 2.5 MG/ML IV SOLN
INTRAVENOUS | Status: DC | PRN
  Administered 2021-12-04 (×3): 200 ug via INTRACORONARY

## 2021-12-04 SURGICAL SUPPLY — 21 items
BALLN SAPPHIRE 2.5X12 (BALLOONS) ×2
BALLOON SAPPHIRE 2.5X12 (BALLOONS) IMPLANT
CATH INFINITI 5 FR 3DRC (CATHETERS) ×1 IMPLANT
CATH INFINITI 5FR MULTPACK ANG (CATHETERS) ×1 IMPLANT
CATH VISTA GUIDE 6FR AL1 (CATHETERS) ×1 IMPLANT
ELECT DEFIB PAD ADLT CADENCE (PAD) ×1 IMPLANT
KIT ENCORE 26 ADVANTAGE (KITS) ×1 IMPLANT
KIT HEART LEFT (KITS) ×2 IMPLANT
KIT HEMO VALVE WATCHDOG (MISCELLANEOUS) ×1 IMPLANT
KIT MICROPUNCTURE NIT STIFF (SHEATH) ×1 IMPLANT
PACK CARDIAC CATHETERIZATION (CUSTOM PROCEDURE TRAY) ×2 IMPLANT
SHEATH PINNACLE 5F 10CM (SHEATH) ×1 IMPLANT
SHEATH PINNACLE 6F 10CM (SHEATH) ×1 IMPLANT
SHEATH PROBE COVER 6X72 (BAG) ×1 IMPLANT
STENT SYNERGY XD 3.50X16 (Permanent Stent) IMPLANT
SYNERGY XD 3.50X16 (Permanent Stent) ×2 IMPLANT
TRANSDUCER W/STOPCOCK (MISCELLANEOUS) ×2 IMPLANT
TUBING CIL FLEX 10 FLL-RA (TUBING) ×2 IMPLANT
WIRE ASAHI PROWATER 180CM (WIRE) ×1 IMPLANT
WIRE EMERALD 3MM-J .035X150CM (WIRE) ×1 IMPLANT
WIRE HI TORQ VERSACORE-J 145CM (WIRE) ×1 IMPLANT

## 2021-12-04 NOTE — H&P (Addendum)
Cardiology Admission History and Physical:   Patient ID: Todd Sampson MRN: [id number]; DOB: [birth year]   Admission date: (Not on file)  PCP:  Townsend Roger, [place] [facility] Cardiologist:  Jenean Lindau, MD   {   Chief Complaint: Chest pain  Patient Profile:   Todd Sampson is a 87 y.o. male with past medical of CAD status post CABG x3v (grafts unknown), hypertension, hyperlipidemia, prostate CA, Barrett's esophagus, permanent atrial fibrillation and diabetes who is being seen 12/04/2021 for the evaluation of chest pain.  History of Present Illness:   Todd Sampson is an 87 year old male with past medical history noted above.  He has been followed by Dr. Geraldo Pitter as an outpatient.  He underwent CABG x3 reported 23 years prior.  Unable to locate report to determine grafts.  Echo from 2019 showed LVEF of 51%, diastolic dysfunction, moderate biatrial enlargement.  He had a myocardial perfusion study 03/2021 which showed LV function of 57% with no ischemia.  He was last seen in the office with Dr. Geraldo Pitter on 10/22/2021 and reported being in his usual state of health.  He was continued on his home medications which included amlodipine 10 mg daily, Eliquis 5 mg twice daily, hydralazine 25 mg twice daily, hydrochlorothiazide 25 mg daily, Imdur 120 mg daily, lisinopril 40 mg daily, and lovastatin 20 mg daily.   He presented to [facility] on 6/5 with complaints of left-sided chest pain that radiated into his shoulder over the past week prior to admission.  Reported this was similar to his angina with his prior MI which required bypass.  He is the primary caregiver to his wife who has dementia.  Does all of the household activities.  Had recently been doing some landscaping around his house and noticed the left-sided chest discomfort would increase and radiate down his left arm with the activity.  Took nitroglycerin over the weekend with improvement in symptoms.  Symptoms worsened  leading up to the day of presentation and he actually went to his cardiology office but was referred to the ED.  Labs at [facility] showed sodium 138, potassium 4, creatinine 0.9, troponin I 0.02, WBC 7.9, hemoglobin 12.4.  EKG showed rate controlled atrial fibrillation, 73 bpm, PVCs.  Blood pressure was noted to be significantly elevated greater than 700 systolic.  He was started on IV nitroglycerin.  Cardiology was consulted and he was seen by Dr. Bettina Gavia with recommendations to transfer to [facility] for further evaluation with cardiac catheterization.  Of note he is on Eliquis for permanent atrial fibrillation, last dose was the morning of 6/5.  Past Medical History:  Diagnosis Date   Anemia    Barrett's esophagus    BMI 32.0-32.9,adult 11/09/2019   BPH (benign prostatic hyperplasia) 10/17/2021   Calculus of gallbladder with chronic cholecystitis without obstruction 11/09/2019   Coronary artery disease of native artery of native heart with stable angina pectoris ([place]) 06/06/2016   Diet-controlled diabetes mellitus ([place]) 06/06/2016   Dyslipidemia (high LDL; low HDL) 06/06/2016   Essential hypertension 06/06/2016   GERD (gastroesophageal reflux disease) 10/17/2021   Guillain-Barre disease ([place])    H/O: hemorrhoidectomy    Heart disease    Hx of CABG 10/02/2017   Hypercholesteremia 10/17/2021   Hyperlipidemia    Hypertension    Mitral regurgitation 07/26/2019   Non-intractable vomiting with nausea 11/09/2019   Permanent atrial fibrillation ([place]) 06/06/2016   Postoperative examination 12/13/2019   Preoperative cardiovascular examination 10/02/2017   Primary osteoarthritis  of left hip 04/20/2018   Added automatically from request for surgery [phone]  Formatting of this note might be different from the original. Added automatically from request for surgery [phone]   Prostate cancer [facility])    Salivary gland calculi    Special screening examination for viral disease 11/09/2019   Toe pain,  bilateral 09/10/2019   Type 2 diabetes, diet controlled ([place]) 7/35/3299   Umbilical hernia without obstruction and without gangrene 11/09/2019   Unilateral primary osteoarthritis, right hip 04/30/2021    Past Surgical History:  Procedure Laterality Date   APPENDECTOMY     CARDIAC BYPASS     REPLACEMENT TOTAL KNEE BILATERAL       Medications Prior to Admission: Prior to Admission medications   Medication Sig Start Date End Date Taking? Authorizing Provider  amLODipine (NORVASC) 10 MG tablet Take 10 mg by mouth daily.    [provider]  ELIQUIS 5 MG TABS tablet TAKE ONE TABLET BY MOUTH TWICE DAILY 10/08/21   Revankar, Reita Cliche, MD  finasteride (PROSCAR) 5 MG tablet Take 5 mg by mouth daily.  05/16/16   [provider]  folic acid (FOLVITE) 1 MG tablet Take 1 mg by mouth daily.    [provider]  furosemide (LASIX) 40 MG tablet Take 1 tablet (40 mg total) by mouth daily as needed for fluid. 10/22/21 01/20/22  Revankar, Reita Cliche, MD  hydrALAZINE (APRESOLINE) 25 MG tablet Take 25 mg by mouth 2 (two) times daily. 01/29/21   [provider]  hydrochlorothiazide (HYDRODIURIL) 25 MG tablet Take 25 mg by mouth daily.    [provider]  isosorbide mononitrate (IMDUR) 120 MG 24 hr tablet Take 1 tablet (120 mg total) by mouth daily. 07/04/21   Revankar, Reita Cliche, MD  lisinopril (ZESTRIL) 40 MG tablet Take 40 mg by mouth daily. 08/11/20   [provider]  lovastatin (MEVACOR) 20 MG tablet Take 1 tablet (20 mg total) by mouth daily at 6 PM. 10/06/17   Revankar, Reita Cliche, MD  nitroGLYCERIN (NITROSTAT) 0.4 MG SL tablet Place 1 tablet (0.4 mg total) under the tongue every 5 (five) minutes as needed for chest pain. 12/03/21   Revankar, Reita Cliche, MD  Omega-3 Fatty Acids (FISH OIL) 1000 MG CAPS Take 1,000 mg by mouth daily.    [provider]  pantoprazole (PROTONIX) 40 MG tablet Take 40 mg by mouth daily.    [provider]  thiamine 100 MG tablet  Take 100 mg by mouth daily.    [provider]     Allergies:    Allergies  Allergen Reactions   Penicillins Other (See Comments)    Childhood reaction--pt does not remember    Social History:   Social History   Socioeconomic History   Marital status: Married    Spouse name: Not on file   Number of children: Not on file   Years of education: Not on file   Highest education level: Not on file  Occupational History   Not on file  Tobacco Use   Smoking status: Former   Smokeless tobacco: Never  Vaping Use   Vaping Use: Never used  Substance and Sexual Activity   Alcohol use: No   Drug use: No   Sexual activity: Not on file  Other Topics Concern   Not on file  Social History Narrative   Not on file   Social Determinants of Health   Financial Resource Strain: Not on file  Food Insecurity: Not  on file  Transportation Needs: Not on file  Physical Activity: Not on file  Stress: Not on file  Social Connections: Not on file  Intimate Partner Violence: Not on file    Family History:   The patient's family history includes Cancer in his brother and mother; Heart disease in his brother, father, and sister.    ROS:  Please see the history of present illness.  All other ROS reviewed and negative.     Physical Exam/Data:  There were no vitals filed for this visit. No intake or output data in the 24 hours ending 12/04/21 1506    10/22/2021    1:27 PM 04/10/2021   12:40 PM 03/29/2021    8:25 AM  Last 3 Weights  Weight (lbs) 203 lb 12.8 oz 211 lb 6.4 oz 206 lb 6.4 oz  Weight (kg) 92.443 kg 95.89 kg 93.622 kg     There is no height or weight on file to calculate BMI.     EKG:  The ECG that was done 6/5 was personally reviewed and demonstrates rate controlled atrial fibrillation, 73 bpm, PVCs  Relevant CV Studies:  Echo: 09/2017  Study Conclusions   - Left ventricle: The cavity size was normal. Wall thickness was    normal. The estimated ejection fraction  was 55%. Doppler    parameters are consistent with both elevated ventricular    end-diastolic filling pressure and elevated left atrial filling    pressure.  - Aortic valve: There was mild regurgitation. Valve area (VTI):    2.62 cm^2. Valve area (Vmax): 2.96 cm^2. Valve area (Vmean): 3.08    cm^2.  - Mitral valve: Calcified annulus. There was mild regurgitation.  - Left atrium: The atrium was moderately dilated.  - Right atrium: The atrium was moderately dilated.  - Atrial septum: A patent foramen ovale cannot be excluded.  - Tricuspid valve: There was moderate regurgitation.   Laboratory Data:  High Sensitivity Troponin:  No results for input(s): TROPONINIHS in the last 720 hours.    ChemistryNo results for input(s): NA, K, CL, CO2, GLUCOSE, BUN, CREATININE, CALCIUM, MG, GFRNONAA, GFRAA, ANIONGAP in the last 168 hours.  No results for input(s): PROT, ALBUMIN, AST, ALT, ALKPHOS, BILITOT in the last 168 hours. Lipids No results for input(s): CHOL, TRIG, HDL, LABVLDL, LDLCALC, CHOLHDL in the last 168 hours. HematologyNo results for input(s): WBC, RBC, HGB, HCT, MCV, MCH, MCHC, RDW, PLT in the last 168 hours. Thyroid No results for input(s): TSH, FREET4 in the last 168 hours. BNPNo results for input(s): BNP, PROBNP in the last 168 hours.  DDimer No results for input(s): DDIMER in the last 168 hours.   Radiology/Studies:  No results found.   Assessment and Plan:   Todd Sampson is a 87 y.o. male with past medical of CAD status post CABG x3v (grafts unknown), hypertension, hyperlipidemia, prostate CA, Barrett's esophagus, permanent atrial fibrillation and diabetes who is being seen 12/04/2021 for the evaluation of chest pain.  Unstable angina/CAD status post three-vessel CABG '02: Troponin I 0.02 x2, she showed rate controlled atrial fibrillation with PVCs.  Blood pressures were significantly elevated and started on IV nitroglycerin.  He was evaluated by Dr. Bettina Gavia and given his symptoms  were similar to his prior MI it was recommended that he be transferred to [facility] for further evaluation with cardiac catheterization. --Continue aspirin, statin, amlodipine, hydralazine.  Unable to add beta-blocker in the setting of slow ventricular response with his atrial fibrillation --Further recommendations post cath  Hypertension: Blood pressures were elevated in the 332R systolic on arrival to [place]. --Initially placed on IV nitroglycerin --PTA meds amlodipine 10 mg daily, hydralazine 25 mg twice daily, HCTZ 25 mg daily, Imdur 120 mg daily --Further recommendations post cath  Hyperlipidemia: On lovastatin 20 mg, fish oil 1000 mg daily PTA --Check lipids in a.m. --Consider transition to high intensity statin   Permanent atrial fibrillation: Rate controlled, mostly slow ventricular rate noted preventing the addition of beta-blocker --Eliquis held with plans for cath, last dose was the morning of 6/5  Prostate CA Barrett's esophagus   Risk Assessment/Risk Scores:   TIMI Risk Score for Unstable Angina or Non-ST Elevation MI:   The patient's TIMI risk score is 5, which indicates a 26% risk of all cause mortality, new or recurrent myocardial infarction or need for urgent revascularization in the next 14 days.{   CHA2DS2-VASc Score = 5   This indicates a 7.2% annual risk of stroke. The patient's score is based upon: CHF History: 0 HTN History: 1 Diabetes History: 1 Stroke History: 0 Vascular Disease History: 1 Age Score: 2 Gender Score: 0    Severity of Illness: The appropriate patient status for this patient is INPATIENT. Inpatient status is judged to be reasonable and necessary in order to provide the required intensity of service to ensure the patient's safety. The patient's presenting symptoms, physical exam findings, and initial radiographic and laboratory data in the context of their chronic comorbidities is felt to place them at high risk for further clinical  deterioration. Furthermore, it is not anticipated that the patient will be medically stable for discharge from the hospital within 2 midnights of admission.   * I certify that at the point of admission it is my clinical judgment that the patient will require inpatient hospital care spanning beyond 2 midnights from the point of admission due to high intensity of service, high risk for further deterioration and high frequency of surveillance required.*   For questions or updates, please contact [place] Please consult [email or web address] for contact info under     Signed, Reino Bellis, NP  12/04/2021 3:06 PM   I have examined the patient and reviewed assessment and plan and discussed with patient.  Agree with above as stated.    GEN: Well nourished, well developed, in no acute distress  HEENT: normal  Neck: no JVD, carotid bruits, or masses Cardiac: irregularly irregular, rate controlled;   Respiratory:  no wheezing bilaterally, normal work of breathing GI: soft, nontender, nondistended,  MS: no deformity or atrophy ; 2+ right radial pulse, 2+ right femoral pulse Skin: warm and dry, no rash Neuro:  Strength and sensation are intact Psych: euthymic mood, full affect  Unstable angina.  Plan is for cath.  Mild renal insufficiency noted so will have to be careful of dye usage.    Grafts unknown so will use femoral appraoach.  Eliquis on hold since yesterday.   Cath Lab Visit (complete for each Cath Lab visit)  Clinical Evaluation Leading to the Procedure:   ACS: Yes.    Non-ACS:    Anginal Classification: CCS IV  Anti-ischemic medical therapy: Minimal Therapy (1 class of medications)  Non-Invasive Test Results: No non-invasive testing performed  Prior CABG: Previous CABG         [employer]

## 2021-12-05 ENCOUNTER — Inpatient Hospital Stay (HOSPITAL_COMMUNITY): Payer: Medicare Other

## 2021-12-05 ENCOUNTER — Other Ambulatory Visit (HOSPITAL_COMMUNITY): Payer: Self-pay

## 2021-12-05 DIAGNOSIS — I25110 Atherosclerotic heart disease of native coronary artery with unstable angina pectoris: Secondary | ICD-10-CM | POA: Diagnosis not present

## 2021-12-05 DIAGNOSIS — I25700 Atherosclerosis of coronary artery bypass graft(s), unspecified, with unstable angina pectoris: Secondary | ICD-10-CM | POA: Diagnosis not present

## 2021-12-05 DIAGNOSIS — I2589 Other forms of chronic ischemic heart disease: Secondary | ICD-10-CM

## 2021-12-05 DIAGNOSIS — I482 Chronic atrial fibrillation, unspecified: Secondary | ICD-10-CM | POA: Diagnosis not present

## 2021-12-05 LAB — BASIC METABOLIC PANEL
Anion gap: 11 (ref 5–15)
BUN: 18 mg/dL (ref 8–23)
CO2: 23 mmol/L (ref 22–32)
Calcium: 9.1 mg/dL (ref 8.9–10.3)
Chloride: 101 mmol/L (ref 98–111)
Creatinine, Ser: 1.07 mg/dL (ref 0.61–1.24)
GFR, Estimated: >60 mL/min (ref >60)
Glucose, Bld: 113 mg/dL — ABNORMAL HIGH (ref 70–99)
Potassium: 3.5 mmol/L (ref 3.5–5.1)
Sodium: 135 mmol/L (ref 135–145)

## 2021-12-05 LAB — ECHOCARDIOGRAM COMPLETE
Area-P 1/2: 2.87 cm2
Calc EF: 58.2 %
S' Lateral: 2.9 cm
Single Plane A2C EF: 61 %
Single Plane A4C EF: 59.9 %
Weight: 3262.81 oz

## 2021-12-05 LAB — POCT ACTIVATED CLOTTING TIME
Activated Clotting Time: 173 seconds
Activated Clotting Time: 179 seconds
Activated Clotting Time: 191 seconds
Activated Clotting Time: 209 seconds

## 2021-12-05 LAB — CBC
HCT: 42.9 % (ref 39.0–52.0)
Hemoglobin: 14.7 g/dL (ref 13.0–17.0)
MCH: 29.4 pg (ref 26.0–34.0)
MCHC: 34.3 g/dL (ref 30.0–36.0)
MCV: 85.8 fL (ref 80.0–100.0)
Platelets: 166 10*3/uL (ref 150–400)
RBC: 5 MIL/uL (ref 4.22–5.81)
RDW: 16.7 % — ABNORMAL HIGH (ref 11.5–15.5)
WBC: 8.3 10*3/uL (ref 4.0–10.5)
nRBC: 0 % (ref 0.0–0.2)

## 2021-12-05 LAB — BRAIN NATRIURETIC PEPTIDE: B Natriuretic Peptide: 494.2 pg/mL — ABNORMAL HIGH (ref 0.0–100.0)

## 2021-12-05 LAB — HEMOGLOBIN A1C
Hgb A1c MFr Bld: 6.6 % — ABNORMAL HIGH (ref 4.8–5.6)
Mean Plasma Glucose: 142.72 mg/dL

## 2021-12-05 LAB — GLUCOSE, CAPILLARY
Glucose-Capillary: 246 mg/dL — ABNORMAL HIGH (ref 70–99)
Glucose-Capillary: 129 mg/dL — ABNORMAL HIGH (ref 70–99)

## 2021-12-05 LAB — TROPONIN I (HIGH SENSITIVITY)
Troponin I (High Sensitivity): 3406 ng/L (ref <18)
Troponin I (High Sensitivity): 3695 ng/L (ref <18)

## 2021-12-05 MED ORDER — PERFLUTREN LIPID MICROSPHERE
1.0000 mL | INTRAVENOUS | Status: AC | PRN
  Administered 2021-12-05: 2 mL via INTRAVENOUS

## 2021-12-05 MED ORDER — APIXABAN 5 MG PO TABS
5.0000 mg | ORAL_TABLET | Freq: Two times a day (BID) | ORAL | Status: DC
  Administered 2021-12-05 – 2021-12-06 (×3): 5 mg via ORAL
  Filled 2021-12-05 (×3): qty 1

## 2021-12-05 MED ORDER — ATROPINE SULFATE 1 MG/10ML IJ SOSY
PREFILLED_SYRINGE | INTRAMUSCULAR | Status: AC
  Filled 2021-12-05: qty 10

## 2021-12-05 MED ORDER — ROSUVASTATIN CALCIUM 20 MG PO TABS
20.0000 mg | ORAL_TABLET | Freq: Every day | ORAL | Status: DC
  Administered 2021-12-05 – 2021-12-06 (×2): 20 mg via ORAL
  Filled 2021-12-05 (×2): qty 1

## 2021-12-05 MED ORDER — HYDRALAZINE HCL 20 MG/ML IJ SOLN
10.0000 mg | Freq: Once | INTRAMUSCULAR | Status: AC
  Administered 2021-12-05: 10 mg via INTRAVENOUS

## 2021-12-05 NOTE — TOC Benefit Eligibility Note (Signed)
Patient Teacher, English as a foreign language completed.    The patient is currently admitted and upon discharge could be taking Eliquis 5 mg.  The current 30 day co-pay is, $47.00.   The patient is currently admitted and upon discharge could be taking Xarelto 20 mg.  The current 30 day co-pay is, $47.00.   The patient is insured through [place], [place] Patient Advocate Specialist [place] Patient Advocate Team Direct Number: [phone]  Fax: [phone]

## 2021-12-05 NOTE — Progress Notes (Signed)
Progress Note  Patient Name: Todd Sampson Date of Encounter: 12/05/2021  [facility] Cardiologist: Jenean Lindau, MD   Subjective   Had significant chest discomfort during the procedure yesterday.  Says the discomfort was different than what led to cath.  He is pain-free this morning.  Inpatient Medications    Scheduled Meds:  amLODipine  10 mg Oral Daily   aspirin  81 mg Oral Daily   Chlorhexidine Gluconate Cloth  6 each Topical Q0600   clopidogrel  300 mg Oral Once   [START ON 12/06/2021] clopidogrel  75 mg Oral Q breakfast   finasteride  5 mg Oral Daily   folic acid  1 mg Oral Daily   hydrALAZINE  25 mg Oral BID   isosorbide mononitrate  120 mg Oral Daily   pantoprazole  40 mg Oral Daily   sodium chloride flush  3 mL Intravenous Q12H   Continuous Infusions:  sodium chloride     bivalirudin (ANGIOMAX) infusion 5 mg/mL (Cath Lab,ACS,PCI indication) Stopped (12/04/21 2205)   PRN Meds: sodium chloride, acetaminophen, nitroGLYCERIN, ondansetron (ZOFRAN) IV, sodium chloride flush   Vital Signs    Vitals:   12/05/21 0545 12/05/21 0600 12/05/21 0615 12/05/21 0630  BP: (!) 116/55 120/63 135/79 131/90  Pulse: 63 75 66 (!) 57  Resp: '18 19 19 17  '$ Temp:      TempSrc:      SpO2: 95% 95% 93% 96%  Weight:        Intake/Output Summary (Last 24 hours) at 12/05/2021 0757 Last data filed at 12/05/2021 0100 Gross per 24 hour  Intake 483.36 ml  Output 1170 ml  Net -686.64 ml      12/04/2021    7:00 PM 10/22/2021    1:27 PM 04/10/2021   12:40 PM  Last 3 Weights  Weight (lbs) 203 lb 14.8 oz 203 lb 12.8 oz 211 lb 6.4 oz  Weight (kg) 92.5 kg 92.443 kg 95.89 kg      Telemetry    Atrial fibs with slow ventricular response- Personally Reviewed  ECG    Atrial fibrillation with controlled rate, left anterior hemiblock.  Compared to admitting EKG on 12/04/2021, inferior lateral T wave abnormality is new.- Personally Reviewed  Physical Exam  Elderly, lying comfortably. GEN:  No acute distress.   Neck: No JVD Cardiac: RRR, no murmurs, rubs, or gallops.  Right femoral access site without evidence of hematoma. Respiratory: Clear to auscultation bilaterally. GI: Soft, nontender, non-distended  MS: No edema; No deformity. Neuro:  Nonfocal  Psych: Normal affect   Labs    High Sensitivity Troponin:  No results for input(s): TROPONINIHS in the last 720 hours.   Chemistry Recent Labs  Lab 12/05/21 0622  NA 135  K 3.5  CL 101  CO2 23  GLUCOSE 113*  BUN 18  CREATININE 1.07  CALCIUM 9.1  GFRNONAA >60  ANIONGAP 11    Lipids No results for input(s): CHOL, TRIG, HDL, LABVLDL, LDLCALC, CHOLHDL in the last 168 hours.  Hematology Recent Labs  Lab 12/05/21 0622  WBC 8.3  RBC 5.00  HGB 14.7  HCT 42.9  MCV 85.8  MCH 29.4  MCHC 34.3  RDW 16.7*  PLT 166   Thyroid No results for input(s): TSH, FREET4 in the last 168 hours.  BNPNo results for input(s): BNP, PROBNP in the last 168 hours.  DDimer No results for input(s): DDIMER in the last 168 hours.   Radiology    CARDIAC CATHETERIZATION  Result Date:  12/04/2021   Mid RCA lesion is 50% stenosed.  Calcified lesion.   RV Branch lesion is 75% stenosed.   Prox RCA lesion is 40% stenosed.   Mid LAD lesion is 50% stenosed.  LIMA was not grafted to the heart.   2nd Diag lesion is 75% stenosed.  Small vessel.   Dist LAD lesion is 70% stenosed.  Severely calcified lesion.   Mid Cx lesion is 100% stenosed.  SVG to OM with Dist Graft lesion is 95% stenosed.   A drug-eluting stent was successfully placed using a SYNERGY XD [id number].   Post intervention, there is a 0% residual stenosis.   LV end diastolic pressure is normal.   There is no aortic valve stenosis. Patient reports that he had CABG x3.  There is only one graft marker.  We engaged the LIMA which did not appear to go to the heart. Successful PCI of the distal SVG to OM.  Transient no reflow successfully treated with intracoronary verapamil.  Brilinta was given prior  to the procedure.  Given that he is on Eliquis, will transition to clopidogrel tomorrow.  Plan for 30 days of aspirin, clopidogrel for 6 months and Eliquis indefinitely in the setting of atrial fibrillation. Given issues with bradycardia during the procedure, we will watch overnight in the ICU. Results conveyed to the patient's daughter, Lynda Rainwater, [phone].    Cardiac Studies   Diagnostic Dominance: Right Intervention   LVEDP 15 mmHg   Patient Profile     87 y.o. male  with past medical of CAD status post CABG x3v (grafts unknown), hypertension, hyperlipidemia, prostate CA, Barrett's esophagus, permanent atrial fibrillation and diabetes who is being seen 12/04/2021 for the evaluation of chest pain.  Assessment & Plan    CAD with prior CABG presenting with progressive angina: Successful stent of distal segment of graft to the obtuse marginal.  No other grafts were identified.  He did have transient no reflow after stenting.  He had residual pain even after the procedure that gradually resolved after 2 to 3 hours.  EKG this morning is not significantly different although there are inferolateral T wave inversions.  An elevated troponin I. Hypertension: Currently, blood pressure control is adequate Permanent atrial fibrillation: 1 episode of bradycardia at around 5 PM.  No recurrence.   Diabetes mellitus type 2: Continue current therapy.  Check A1c.  Phase 1 cardiac rehab.  If ambulates without difficulty, perhaps discharge later this afternoon or in a.m.  For questions or updates, please contact [place] Please consult [email or web address] for contact info under        Signed, Sinclair Grooms, MD  12/05/2021, 7:57 AM

## 2021-12-05 NOTE — Progress Notes (Signed)
  Transition of Care [facility]) Screening Note   Patient Details  Name: Todd Sampson Date of Birth: 03-Jun-1935   Transition of Care [facility]) CM/SW Contact:    Milas Gain, [place] Phone Number: 12/05/2021, 3:17 PM    Transition of Care Department [facility]) has reviewed patient and no TOC needs have been identified at this time. We will continue to monitor patient advancement through interdisciplinary progression rounds. If new patient transition needs arise, please place a TOC consult.

## 2021-12-05 NOTE — Progress Notes (Signed)
  Echocardiogram 2D Echocardiogram has been performed.  Bobbye Charleston 12/05/2021, 12:10 PM

## 2021-12-05 NOTE — Progress Notes (Signed)
Arterial sheath removed at 0500. Manual pressure held for 20 minutes. No complications. Site is a level 0.

## 2021-12-05 NOTE — Progress Notes (Signed)
CARDIAC REHAB PHASE I   PRE:  Rate/Rhythm: 72 afib    BP: sitting 118/71    SaO2: 98 RA  MODE:  Ambulation: 370 ft   POST:  Rate/Rhythm: 98 afib    BP: sitting 137/80     SaO2: 98 RA  Tolerated well, no c/o, no assist. To recliner. Discussed with pt stent, Plavix, restrictions, exercise, NTG and CRPII. Pt receptive, he is concerned re 3 blood thinners and asks about Watchman. Encouraged discussion with providers. Will refer to [place].  [place], ACSM 12/05/2021 3:27 PM

## 2021-12-06 ENCOUNTER — Other Ambulatory Visit (HOSPITAL_COMMUNITY): Payer: Self-pay

## 2021-12-06 ENCOUNTER — Encounter (HOSPITAL_COMMUNITY): Payer: Self-pay | Admitting: Interventional Cardiology

## 2021-12-06 ENCOUNTER — Other Ambulatory Visit: Payer: Self-pay

## 2021-12-06 DIAGNOSIS — I4821 Permanent atrial fibrillation: Secondary | ICD-10-CM

## 2021-12-06 DIAGNOSIS — I25110 Atherosclerotic heart disease of native coronary artery with unstable angina pectoris: Secondary | ICD-10-CM | POA: Diagnosis not present

## 2021-12-06 DIAGNOSIS — I495 Sick sinus syndrome: Secondary | ICD-10-CM | POA: Diagnosis not present

## 2021-12-06 DIAGNOSIS — G4734 Idiopathic sleep related nonobstructive alveolar hypoventilation: Secondary | ICD-10-CM | POA: Diagnosis not present

## 2021-12-06 LAB — LIPOPROTEIN A (LPA): Lipoprotein (a): 16.1 nmol/L (ref <75.0)

## 2021-12-06 MED ORDER — CLOPIDOGREL BISULFATE 75 MG PO TABS
75.0000 mg | ORAL_TABLET | Freq: Every day | ORAL | 6 refills | Status: DC
  Filled 2021-12-06: qty 90, 90d supply, fill #0

## 2021-12-06 MED ORDER — ASPIRIN 81 MG PO TBEC
81.0000 mg | DELAYED_RELEASE_TABLET | Freq: Every day | ORAL | 0 refills | Status: AC
  Filled 2021-12-06: qty 14, 14d supply, fill #0

## 2021-12-06 MED ORDER — ROSUVASTATIN CALCIUM 20 MG PO TABS
20.0000 mg | ORAL_TABLET | Freq: Every day | ORAL | 6 refills | Status: DC
  Filled 2021-12-06: qty 30, 30d supply, fill #0

## 2021-12-06 NOTE — Progress Notes (Addendum)
Progress Note  Patient Name: Todd Sampson Date of Encounter: 12/06/2021  [facility] Cardiologist: Jenean Lindau, MD   Subjective   Had significant chest discomfort during the procedure yesterday.  Says the discomfort was different than what led to cath.  He is pain-free this morning.  Inpatient Medications    Scheduled Meds:  amLODipine  10 mg Oral Daily   apixaban  5 mg Oral BID   aspirin  81 mg Oral Daily   Chlorhexidine Gluconate Cloth  6 each Topical Q0600   clopidogrel  75 mg Oral Q breakfast   finasteride  5 mg Oral Daily   folic acid  1 mg Oral Daily   hydrALAZINE  25 mg Oral BID   isosorbide mononitrate  120 mg Oral Daily   pantoprazole  40 mg Oral Daily   rosuvastatin  20 mg Oral Daily   sodium chloride flush  3 mL Intravenous Q12H   Continuous Infusions:  sodium chloride     PRN Meds: sodium chloride, acetaminophen, nitroGLYCERIN, ondansetron (ZOFRAN) IV, sodium chloride flush   Vital Signs    Vitals:   12/06/21 0700 12/06/21 0800 12/06/21 0838 12/06/21 0850  BP: (!) 175/93 125/74 (!) 145/69   Pulse: 76 65 80   Resp: 18 14 (!) 21   Temp: 97.8 F (36.6 C)     TempSrc: Oral     SpO2: 92% 94% (!) 89%   Weight:      Height:    5' 9.5" (1.765 m)    Intake/Output Summary (Last 24 hours) at 12/06/2021 0859 Last data filed at 12/06/2021 0800 Gross per 24 hour  Intake 240 ml  Output 975 ml  Net -735 ml      12/04/2021    7:00 PM 10/22/2021    1:27 PM 04/10/2021   12:40 PM  Last 3 Weights  Weight (lbs) 203 lb 14.8 oz 203 lb 12.8 oz 211 lb 6.4 oz  Weight (kg) 92.5 kg 92.443 kg 95.89 kg      Telemetry    Atrial fibs with slow ventricular response- Personally Reviewed  ECG    Atrial fibrillation with controlled rate, left anterior hemiblock.  Compared to admitting EKG on 12/04/2021, inferior lateral T wave abnormality is new.- Personally Reviewed  Physical Exam  Elderly, lying comfortably. GEN: No acute distress.   Neck: No JVD Cardiac: RRR,  no murmurs, rubs, or gallops.  Femoral access site is unremarkable.   Respiratory: Clear to auscultation bilaterally. GI: Soft, nontender, non-distended  MS: No edema; No deformity. Neuro:  Nonfocal  Psych: Normal affect   Labs    High Sensitivity Troponin:   Recent Labs  Lab 12/05/21 0622 12/05/21 1127  TROPONINIHS 3,406* 3,695*     Chemistry Recent Labs  Lab 12/05/21 0622  NA 135  K 3.5  CL 101  CO2 23  GLUCOSE 113*  BUN 18  CREATININE 1.07  CALCIUM 9.1  GFRNONAA >60  ANIONGAP 11    Lipids No results for input(s): "CHOL", "TRIG", "HDL", "LABVLDL", "LDLCALC", "CHOLHDL" in the last 168 hours.  Hematology Recent Labs  Lab 12/05/21 0622  WBC 8.3  RBC 5.00  HGB 14.7  HCT 42.9  MCV 85.8  MCH 29.4  MCHC 34.3  RDW 16.7*  PLT 166   Thyroid No results for input(s): "TSH", "FREET4" in the last 168 hours.  BNP Recent Labs  Lab 12/05/21 0622  BNP 494.2*    DDimer No results for input(s): "DDIMER" in the last 168 hours.  Radiology    ECHOCARDIOGRAM COMPLETE  Result Date: 12/05/2021    ECHOCARDIOGRAM REPORT   Patient Name:   Todd Sampson Date of Exam: 12/05/2021 Medical Rec #:  [id number]     Height:       69.0 in Accession #:    [id number]    Weight:       203.9 lb Date of Birth:  02/03/1935      BSA:          2.083 m Patient Age:    26 years      BP:           135/69 mmHg Patient Gender: M             HR:           73 bpm. Exam Location:  Inpatient Procedure: 2D Echo, Cardiac Doppler, Color Doppler and Intracardiac            Opacification Agent Indications:    [id number] Ischemic heart disease  History:        Patient has prior history of Echocardiogram examinations, most                 recent 10/14/2017. Abnormal ECG and Prior CABG, Arrythmias:Atrial                 Fibrillation; Risk Factors:Hypertension, Diabetes and                 Dyslipidemia. Cancer. Patient is post PCI.  Sonographer:    Roseanna Rainbow RDCS Referring Phys: [place]  1. Left  ventricular ejection fraction, by estimation, is 55 to 60%. The left ventricle has normal function. The left ventricle has no regional wall motion abnormalities. There is mild left ventricular hypertrophy. Left ventricular diastolic parameters are indeterminate.  2. Right ventricular systolic function is mildly reduced. The right ventricular size is mildly enlarged. There is normal pulmonary artery systolic pressure. The estimated right ventricular systolic pressure is 38.4 mmHg.  3. Left atrial size was moderately dilated.  4. Right atrial size was moderately dilated.  5. The mitral valve is degenerative. Mild mitral valve regurgitation. Moderate mitral annular calcification.  6. The aortic valve is tricuspid. There is moderate calcification of the aortic valve. There is moderate thickening of the aortic valve. Aortic valve regurgitation is trivial.  7. Aortic dilatation noted. There is borderline dilatation of the ascending aorta, measuring 36 mm.  8. The inferior vena cava is dilated in size with >50% respiratory variability, suggesting right atrial pressure of 8 mmHg. Comparison(s): No significant change from prior study. Prior TTE in 2019 with LVEF 55%, calcified annulus, mild MR, moderate TR. FINDINGS  Left Ventricle: Left ventricular ejection fraction, by estimation, is 55 to 60%. The left ventricle has normal function. The left ventricle has no regional wall motion abnormalities. Definity contrast agent was given IV to delineate the left ventricular  endocardial borders. The left ventricular internal cavity size was normal in size. There is mild left ventricular hypertrophy. Left ventricular diastolic parameters are indeterminate. Right Ventricle: The right ventricular size is mildly enlarged. No increase in right ventricular wall thickness. Right ventricular systolic function is mildly reduced. There is normal pulmonary artery systolic pressure. The tricuspid regurgitant velocity  is 2.30 m/s, and with an  assumed right atrial pressure of 3 mmHg, the estimated right ventricular systolic pressure is 66.5 mmHg. Left Atrium: Left atrial size was moderately dilated. Right Atrium: Right atrial size was moderately dilated. Pericardium:  There is no evidence of pericardial effusion. Mitral Valve: The mitral valve is degenerative in appearance. There is moderate thickening of the mitral valve leaflet(s). There is moderate calcification of the mitral valve leaflet(s). Moderate mitral annular calcification. Mild mitral valve regurgitation. Tricuspid Valve: The tricuspid valve is normal in structure. Tricuspid valve regurgitation is mild. Aortic Valve: The aortic valve is tricuspid. There is moderate calcification of the aortic valve. There is moderate thickening of the aortic valve. Aortic valve regurgitation is trivial. Pulmonic Valve: The pulmonic valve was normal in structure. Pulmonic valve regurgitation is trivial. Aorta: Aortic dilatation noted. There is borderline dilatation of the ascending aorta, measuring 36 mm. Venous: The inferior vena cava is dilated in size with greater than 50% respiratory variability, suggesting right atrial pressure of 8 mmHg. IAS/Shunts: The atrial septum is grossly normal.  LEFT VENTRICLE PLAX 2D LVIDd:         3.80 cm LVIDs:         2.90 cm LV PW:         1.30 cm LV IVS:        1.30 cm LVOT diam:     2.50 cm LV SV:         96 LV SV Index:   46 LVOT Area:     4.91 cm  LV Volumes (MOD) LV vol d, MOD A2C: 126.0 ml LV vol d, MOD A4C: 69.6 ml LV vol s, MOD A2C: 49.2 ml LV vol s, MOD A4C: 27.9 ml LV SV MOD A2C:     76.8 ml LV SV MOD A4C:     69.6 ml LV SV MOD BP:      55.9 ml RIGHT VENTRICLE             IVC RV S prime:     11.10 cm/s  IVC diam: 2.60 cm TAPSE (M-mode): 1.6 cm LEFT ATRIUM             Index        RIGHT ATRIUM           Index LA diam:        4.70 cm 2.26 cm/m   RA Area:     28.40 cm LA Vol (A2C):   63.3 ml 30.39 ml/m  RA Volume:   92.30 ml  44.31 ml/m LA Vol (A4C):   85.8 ml  41.19 ml/m LA Biplane Vol: 74.5 ml 35.76 ml/m  AORTIC VALVE LVOT Vmax:   98.10 cm/s LVOT Vmean:  59.500 cm/s LVOT VTI:    0.196 m  AORTA Ao Root diam: 3.90 cm Ao Asc diam:  3.60 cm MITRAL VALVE               TRICUSPID VALVE MV Area (PHT): 2.87 cm    TR Peak grad:   21.2 mmHg MV Decel Time: 264 msec    TR Vmax:        230.00 cm/s MV E velocity: 94.40 cm/s                            SHUNTS                            Systemic VTI:  0.20 m                            Systemic Diam: 2.50 cm Gwyndolyn Kaufman MD  Electronically signed by Gwyndolyn Kaufman MD Signature Date/Time: 12/05/2021/1:44:50 PM    Final    CARDIAC CATHETERIZATION  Result Date: 12/04/2021   Mid RCA lesion is 50% stenosed.  Calcified lesion.   RV Branch lesion is 75% stenosed.   Prox RCA lesion is 40% stenosed.   Mid LAD lesion is 50% stenosed.  LIMA was not grafted to the heart.   2nd Diag lesion is 75% stenosed.  Small vessel.   Dist LAD lesion is 70% stenosed.  Severely calcified lesion.   Mid Cx lesion is 100% stenosed.  SVG to OM with Dist Graft lesion is 95% stenosed.   A drug-eluting stent was successfully placed using a SYNERGY XD [id number].   Post intervention, there is a 0% residual stenosis.   LV end diastolic pressure is normal.   There is no aortic valve stenosis. Patient reports that he had CABG x3.  There is only one graft marker.  We engaged the LIMA which did not appear to go to the heart. Successful PCI of the distal SVG to OM.  Transient no reflow successfully treated with intracoronary verapamil.  Brilinta was given prior to the procedure.  Given that he is on Eliquis, will transition to clopidogrel tomorrow.  Plan for 30 days of aspirin, clopidogrel for 6 months and Eliquis indefinitely in the setting of atrial fibrillation. Given issues with bradycardia during the procedure, we will watch overnight in the ICU. Results conveyed to the patient's daughter, Lynda Rainwater, [phone].    Cardiac Studies   Diagnostic Dominance:  Right Intervention   LVEDP 15 mmHg   Patient Profile     87 y.o. male  with past medical of CAD status post CABG x3v (grafts unknown), hypertension, hyperlipidemia, prostate CA, Barrett's esophagus, permanent atrial fibrillation and diabetes who is being seen 12/04/2021 for the evaluation of chest pain.  Assessment & Plan    CAD with prior CABG presenting with progressive angina: He had a mild troponin bump.  No recurrent pain overnight.   Hypertension: Currently, blood pressure control is adequate Permanent atrial fibrillation: 1 episode of bradycardia at around 5 PM.  No recurrence.   Diabetes mellitus type 2: Continue current therapy.  Check A1c. Nocturnal hypoxia and bradycardia: Primary cardiologist, Dr. Geraldo Pitter may consider monitoring and sleep study.   Plan discharge today with 1 week follow-up with Dr. Geraldo Pitter. Continue preadmission medication regimen. Continue apixaban, aspirin, and clopidogrel.  Discontinue aspirin after 2 weeks. Intermittent nocturnal bradycardia and hypoxia.  Consider sleep study, per Dr. Geraldo Pitter.   For questions or updates, please contact [place] Please consult [email or web address] for contact info under        Signed, Sinclair Grooms, MD  12/06/2021, 8:59 AM

## 2021-12-06 NOTE — Discharge Summary (Addendum)
The patient has been seen in conjunction with Vin Bhaget, PAC. All aspects of care have been considered and discussed. The patient has been personally interviewed, examined, and all clinical data has been reviewed.  Please see the progress note from earlier this morning. Patient is ready for discharge. Agree with plans as outlined.   Discharge Summary    Patient ID: Todd Sampson MRN: [id number]; DOB: 1934/12/13  Admit date: 12/04/2021 Discharge date: 12/06/2021  PCP:  Townsend Roger, MD   [facility] Providers Cardiologist:  Jenean Lindau, MD   {  Discharge Diagnoses    Principal Problem:   Coronary artery disease involving native coronary artery of native heart with unstable angina pectoris [facility]) Hypertension Hyperlipidemia Permanent atrial fibrillation Intermittent nocturnal bradycardia and hypoxia  Diagnostic Studies/Procedures   Echo 12/05/21 1. Left ventricular ejection fraction, by estimation, is 55 to 60%. The  left ventricle has normal function. The left ventricle has no regional  wall motion abnormalities. There is mild left ventricular hypertrophy.  Left ventricular diastolic parameters  are indeterminate.   2. Right ventricular systolic function is mildly reduced. The right  ventricular size is mildly enlarged. There is normal pulmonary artery  systolic pressure. The estimated right ventricular systolic pressure is  95.6 mmHg.   3. Left atrial size was moderately dilated.   4. Right atrial size was moderately dilated.   5. The mitral valve is degenerative. Mild mitral valve regurgitation.  Moderate mitral annular calcification.   6. The aortic valve is tricuspid. There is moderate calcification of the  aortic valve. There is moderate thickening of the aortic valve. Aortic  valve regurgitation is trivial.   7. Aortic dilatation noted. There is borderline dilatation of the  ascending aorta, measuring 36 mm.   8. The inferior vena cava is dilated in size  with >50% respiratory  variability, suggesting right atrial pressure of 8 mmHg.   Comparison(s): No significant change from prior study. Prior TTE in 2019  with LVEF 55%, calcified annulus, mild MR, moderate TR.   CORONARY STENT INTERVENTION  LEFT HEART CATH AND CORS/GRAFTS ANGIOGRAPHY   Conclusion      Mid RCA lesion is 50% stenosed.  Calcified lesion.   RV Branch lesion is 75% stenosed.   Prox RCA lesion is 40% stenosed.   Mid LAD lesion is 50% stenosed.  LIMA was not grafted to the heart.   2nd Diag lesion is 75% stenosed.  Small vessel.   Dist LAD lesion is 70% stenosed.  Severely calcified lesion.   Mid Cx lesion is 100% stenosed.  SVG to OM with Dist Graft lesion is 95% stenosed.   A drug-eluting stent was successfully placed using a SYNERGY XD 3.50X16.   Post intervention, there is a 0% residual stenosis.   LV end diastolic pressure is normal.   There is no aortic valve stenosis.   Patient reports that he had CABG x3.  There is only one graft marker.  We engaged the LIMA which did not appear to go to the heart.   Successful PCI of the distal SVG to OM.  Transient no reflow successfully treated with intracoronary verapamil.  Brilinta was given prior to the procedure.  Given that he is on Eliquis, will transition to clopidogrel tomorrow.  Plan for 30 days of aspirin, clopidogrel for 6 months and Eliquis indefinitely in the setting of atrial fibrillation.   Given issues with bradycardia during the procedure, we will watch overnight in the ICU.  Diagnostic Dominance: Right  Intervention    History of Present Illness     Todd Sampson is a 87 y.o. male with  past medical of CAD status post CABG (grafts unknown), hypertension, hyperlipidemia, prostate CA, Barrett's esophagus, permanent atrial fibrillation and diabetes who is being seen for the evaluation of chest pain.  He underwent CABG reported 23 years prior.  Unable to locate report to determine grafts.  Echo from 2019  showed LVEF of 78%, diastolic dysfunction, moderate biatrial enlargement.   He had a myocardial perfusion study 03/2021 which showed LV function of 57% with no ischemia.   He presented to [facility] on 6/5 with complaints of left-sided chest pain that radiated into his shoulder over the past week prior to admission.  Reported this was similar to his angina with his prior MI which required bypass.  He is the primary caregiver to his wife who has dementia.  Does all of the household activities.  Had recently been doing some landscaping around his house and noticed the left-sided chest discomfort would increase and radiate down his left arm with the activity.  Took nitroglycerin over the weekend with improvement in symptoms.  Symptoms worsened leading up to the day of presentation and he actually went to his cardiology office but was referred to the ED.   Labs at [facility] showed sodium 138, potassium 4, creatinine 0.9, troponin I 0.02, WBC 7.9, hemoglobin 12.4.  EKG showed rate controlled atrial fibrillation, 73 bpm, PVCs.  Blood pressure was noted to be significantly elevated greater than 295 systolic.  He was started on IV nitroglycerin.  Cardiology was consulted and he was seen by Dr. Bettina Gavia with recommendations to transfer to [facility] for further evaluation with cardiac catheterization.  Hospital Course     Consultants: None   Unstable angina/CAD status post three-vessel CABG '02: Troponin I 0.02 x2, she showed rate controlled atrial fibrillation with PVCs.  Blood pressures were significantly elevated and started on IV nitroglycerin.  He was evaluated by Dr. Bettina Gavia and given his symptoms were similar to his prior MI it was recommended that he be transferred to [facility] for further evaluation with cardiac catheterization.  Cardiac catheterization showed only 1 graft maker with SVG to OM.  Engage LIMA which did not appear to go to heart.  Patient underwent successful PCI with DES placement  to distal SVG to OM.  He was given Brilinta during cath procedure and then transition to Plavix.  Echocardiogram showed preserved LV function. Plan for triple therapy with aspirin, Plavix and Eliquis.  Discontinue aspirin after 2 weeks.  Continue Plavix for 6 months. Continue Imdur>> this can be reduce or discontinue.  Outpatient setting. Cardiac rehab II [place].   Hypertension: Blood pressures were elevated in the 621H systolic on arrival to [place]. Initially placed on IV nitroglycerin -- We will continue home medication   Hyperlipidemia: On lovastatin 20 mg, fish oil 1000 mg daily PTA --No results found for requested labs within last 365 days.  -- Changed her statin to Crestor 20 mg.  Lipid panel and LFTs recommended in 6 weeks.   Permanent atrial fibrillation: Rate controlled, mostly slow ventricular rate noted preventing the addition of beta-blocker --Eliquis held with plans for cath and resume afterwards  Intermittent nocturnal bradycardia and hypoxia: Recommended outpatient sleep study per Dr. Geraldo Pitter  Did the patient have an acute coronary syndrome (MI, NSTEMI, STEMI, etc) this admission?:  No  Did the patient have a percutaneous coronary intervention (stent / angioplasty)?:  Yes.     Cath/PCI Registry Performance & Quality Measures: Aspirin prescribed? - Yes ADP Receptor Inhibitor (Plavix/Clopidogrel, Brilinta/Ticagrelor or Effient/Prasugrel) prescribed (includes medically managed patients)? - Yes High Intensity Statin (Lipitor 40-'80mg'$  or Crestor 20-'40mg'$ ) prescribed? - Yes For EF <40%, was ACEI/ARB prescribed? - Yes For EF <40%, Aldosterone Antagonist (Spironolactone or Eplerenone) prescribed? - Not Applicable (EF >/= 85%) Cardiac Rehab Phase II ordered? - Yes    Discharge Vitals Blood pressure (!) 144/66, pulse 61, temperature 97.8 F (36.6 C), temperature source Oral, resp. rate 15, height 5' 9.5" (1.765 m), weight 92.5 kg, SpO2 94 %.   Filed Weights   12/04/21 1900  Weight: 92.5 kg    Labs & Radiologic Studies    CBC Recent Labs    12/05/21 0622  WBC 8.3  HGB 14.7  HCT 42.9  MCV 85.8  PLT 277   Basic Metabolic Panel Recent Labs    12/05/21 0622  NA 135  K 3.5  CL 101  CO2 23  GLUCOSE 113*  BUN 18  CREATININE 1.07  CALCIUM 9.1    High Sensitivity Troponin:   Recent Labs  Lab 12/05/21 0622 12/05/21 1127  TROPONINIHS 3,406* 3,695*     Hemoglobin A1C Recent Labs    12/05/21 0622  HGBA1C 6.6*    _____________  ECHOCARDIOGRAM COMPLETE  Result Date: 12/05/2021    ECHOCARDIOGRAM REPORT   Patient Name:   Todd Sampson Date of Exam: 12/05/2021 Medical Rec #:  [id number]     Height:       69.0 in Accession #:    [id number]    Weight:       203.9 lb Date of Birth:  October 25, 1934      BSA:          2.083 m Patient Age:    30 years      BP:           135/69 mmHg Patient Gender: M             HR:           73 bpm. Exam Location:  Inpatient Procedure: 2D Echo, Cardiac Doppler, Color Doppler and Intracardiac            Opacification Agent Indications:    [id number] Ischemic heart disease  History:        Patient has prior history of Echocardiogram examinations, most                 recent 10/14/2017. Abnormal ECG and Prior CABG, Arrythmias:Atrial                 Fibrillation; Risk Factors:Hypertension, Diabetes and                 Dyslipidemia. Cancer. Patient is post PCI.  Sonographer:    Roseanna Rainbow RDCS Referring Phys: [place]  1. Left ventricular ejection fraction, by estimation, is 55 to 60%. The left ventricle has normal function. The left ventricle has no regional wall motion abnormalities. There is mild left ventricular hypertrophy. Left ventricular diastolic parameters are indeterminate.  2. Right ventricular systolic function is mildly reduced. The right ventricular size is mildly enlarged. There is normal pulmonary artery systolic pressure. The estimated right ventricular systolic pressure is  76.1 mmHg.  3. Left atrial size was moderately dilated.  4. Right atrial size was moderately dilated.  5. The mitral valve is degenerative.  Mild mitral valve regurgitation. Moderate mitral annular calcification.  6. The aortic valve is tricuspid. There is moderate calcification of the aortic valve. There is moderate thickening of the aortic valve. Aortic valve regurgitation is trivial.  7. Aortic dilatation noted. There is borderline dilatation of the ascending aorta, measuring 36 mm.  8. The inferior vena cava is dilated in size with >50% respiratory variability, suggesting right atrial pressure of 8 mmHg. Comparison(s): No significant change from prior study. Prior TTE in 2019 with LVEF 55%, calcified annulus, mild MR, moderate TR. FINDINGS  Left Ventricle: Left ventricular ejection fraction, by estimation, is 55 to 60%. The left ventricle has normal function. The left ventricle has no regional wall motion abnormalities. Definity contrast agent was given IV to delineate the left ventricular  endocardial borders. The left ventricular internal cavity size was normal in size. There is mild left ventricular hypertrophy. Left ventricular diastolic parameters are indeterminate. Right Ventricle: The right ventricular size is mildly enlarged. No increase in right ventricular wall thickness. Right ventricular systolic function is mildly reduced. There is normal pulmonary artery systolic pressure. The tricuspid regurgitant velocity  is 2.30 m/s, and with an assumed right atrial pressure of 3 mmHg, the estimated right ventricular systolic pressure is 48.1 mmHg. Left Atrium: Left atrial size was moderately dilated. Right Atrium: Right atrial size was moderately dilated. Pericardium: There is no evidence of pericardial effusion. Mitral Valve: The mitral valve is degenerative in appearance. There is moderate thickening of the mitral valve leaflet(s). There is moderate calcification of the mitral valve leaflet(s). Moderate  mitral annular calcification. Mild mitral valve regurgitation. Tricuspid Valve: The tricuspid valve is normal in structure. Tricuspid valve regurgitation is mild. Aortic Valve: The aortic valve is tricuspid. There is moderate calcification of the aortic valve. There is moderate thickening of the aortic valve. Aortic valve regurgitation is trivial. Pulmonic Valve: The pulmonic valve was normal in structure. Pulmonic valve regurgitation is trivial. Aorta: Aortic dilatation noted. There is borderline dilatation of the ascending aorta, measuring 36 mm. Venous: The inferior vena cava is dilated in size with greater than 50% respiratory variability, suggesting right atrial pressure of 8 mmHg. IAS/Shunts: The atrial septum is grossly normal.  LEFT VENTRICLE PLAX 2D LVIDd:         3.80 cm LVIDs:         2.90 cm LV PW:         1.30 cm LV IVS:        1.30 cm LVOT diam:     2.50 cm LV SV:         96 LV SV Index:   46 LVOT Area:     4.91 cm  LV Volumes (MOD) LV vol d, MOD A2C: 126.0 ml LV vol d, MOD A4C: 69.6 ml LV vol s, MOD A2C: 49.2 ml LV vol s, MOD A4C: 27.9 ml LV SV MOD A2C:     76.8 ml LV SV MOD A4C:     69.6 ml LV SV MOD BP:      55.9 ml RIGHT VENTRICLE             IVC RV S prime:     11.10 cm/s  IVC diam: 2.60 cm TAPSE (M-mode): 1.6 cm LEFT ATRIUM             Index        RIGHT ATRIUM           Index LA diam:        4.70 cm  2.26 cm/m   RA Area:     28.40 cm LA Vol (A2C):   63.3 ml 30.39 ml/m  RA Volume:   92.30 ml  44.31 ml/m LA Vol (A4C):   85.8 ml 41.19 ml/m LA Biplane Vol: 74.5 ml 35.76 ml/m  AORTIC VALVE LVOT Vmax:   98.10 cm/s LVOT Vmean:  59.500 cm/s LVOT VTI:    0.196 m  AORTA Ao Root diam: 3.90 cm Ao Asc diam:  3.60 cm MITRAL VALVE               TRICUSPID VALVE MV Area (PHT): 2.87 cm    TR Peak grad:   21.2 mmHg MV Decel Time: 264 msec    TR Vmax:        230.00 cm/s MV E velocity: 94.40 cm/s                            SHUNTS                            Systemic VTI:  0.20 m                             Systemic Diam: 2.50 cm Gwyndolyn Kaufman MD Electronically signed by Gwyndolyn Kaufman MD Signature Date/Time: 12/05/2021/1:44:50 PM    Final    CARDIAC CATHETERIZATION  Result Date: 12/04/2021   Mid RCA lesion is 50% stenosed.  Calcified lesion.   RV Branch lesion is 75% stenosed.   Prox RCA lesion is 40% stenosed.   Mid LAD lesion is 50% stenosed.  LIMA was not grafted to the heart.   2nd Diag lesion is 75% stenosed.  Small vessel.   Dist LAD lesion is 70% stenosed.  Severely calcified lesion.   Mid Cx lesion is 100% stenosed.  SVG to OM with Dist Graft lesion is 95% stenosed.   A drug-eluting stent was successfully placed using a SYNERGY XD 3.50X16.   Post intervention, there is a 0% residual stenosis.   LV end diastolic pressure is normal.   There is no aortic valve stenosis. Patient reports that he had CABG x3.  There is only one graft marker.  We engaged the LIMA which did not appear to go to the heart. Successful PCI of the distal SVG to OM.  Transient no reflow successfully treated with intracoronary verapamil.  Brilinta was given prior to the procedure.  Given that he is on Eliquis, will transition to clopidogrel tomorrow.  Plan for 30 days of aspirin, clopidogrel for 6 months and Eliquis indefinitely in the setting of atrial fibrillation. Given issues with bradycardia during the procedure, we will watch overnight in the ICU. Results conveyed to the patient's daughter, Lynda Rainwater, [phone].    Disposition   Pt is being discharged home today in good condition.  Follow-up Plans & Appointments     Follow-up Information     Revankar, Reita Cliche, MD Follow up on 12/13/2021.   Specialty: Cardiology Why: '@1pm'$  for hospital follow up Contact information: [place] [address] [phone]                Discharge Instructions     Amb Referral to Cardiac Rehabilitation   Complete by: As directed    To [place]   Diagnosis:  Coronary Stents PTCA     After initial  evaluation and assessments completed:  Virtual Based Care may be provided alone or in conjunction with Phase 2 Cardiac Rehab based on patient barriers.: Yes   Diet - low sodium heart healthy   Complete by: As directed    Discharge instructions   Complete by: As directed    No driving for 48 hours. No lifting over 5 lbs for 1 week. No sexual activity for 1 week. Keep procedure site clean & dry. If you notice increased pain, swelling, bleeding or pus, call/return!  You may shower, but no soaking baths/hot tubs/pools for 1 week.   Increase activity slowly   Complete by: As directed        Discharge Medications   Allergies as of 12/06/2021       Reactions   Penicillins Other (See Comments)   Childhood reaction--pt does not remember        Medication List     STOP taking these medications    lovastatin 20 MG tablet Commonly known as: MEVACOR       TAKE these medications    acetaminophen 500 MG tablet Commonly known as: TYLENOL Take 500-1,000 mg by mouth every 6 (six) hours as needed.   amLODipine 10 MG tablet Commonly known as: NORVASC Take 10 mg by mouth daily.   aspirin EC 81 MG tablet Take 1 tablet (81 mg total) by mouth daily for 14 days. Swallow whole.   clopidogrel 75 MG tablet Commonly known as: PLAVIX Take 1 tablet (75 mg total) by mouth daily with breakfast. Start taking on: December 07, 2021   Eliquis 5 MG Tabs tablet Generic drug: apixaban TAKE ONE TABLET BY MOUTH TWICE DAILY What changed: how much to take   finasteride 5 MG tablet Commonly known as: PROSCAR Take 5 mg by mouth daily.   Fish Oil 1000 MG Caps Take 1,000 mg by mouth daily.   folic acid 1 MG tablet Commonly known as: FOLVITE Take 1 mg by mouth daily.   furosemide 40 MG tablet Commonly known as: LASIX Take 1 tablet (40 mg total) by mouth daily as needed for fluid.   hydrALAZINE 25 MG tablet Commonly known as: APRESOLINE Take 25 mg by mouth 2 (two) times daily.   hydrochlorothiazide  25 MG tablet Commonly known as: HYDRODIURIL Take 25 mg by mouth daily.   isosorbide mononitrate 120 MG 24 hr tablet Commonly known as: IMDUR Take 1 tablet (120 mg total) by mouth daily.   lisinopril 40 MG tablet Commonly known as: ZESTRIL Take 40 mg by mouth daily.   nitroGLYCERIN 0.4 MG SL tablet Commonly known as: NITROSTAT Place 1 tablet (0.4 mg total) under the tongue every 5 (five) minutes as needed for chest pain.   pantoprazole 40 MG tablet Commonly known as: PROTONIX Take 40 mg by mouth daily.   rosuvastatin 20 MG tablet Commonly known as: CRESTOR Take 1 tablet (20 mg total) by mouth daily. Start taking on: December 07, 2021   thiamine 100 MG tablet Take 100 mg by mouth daily.         Outstanding Labs/Studies   Lipid panel and LFTs in 6 to 8 weeks  Duration of Discharge Encounter   Greater than 30 minutes including physician time.  Jarrett Soho, PA 12/06/2021, 9:51 AM

## 2021-12-07 ENCOUNTER — Other Ambulatory Visit (HOSPITAL_COMMUNITY): Payer: Self-pay

## 2021-12-11 ENCOUNTER — Other Ambulatory Visit: Payer: Self-pay

## 2021-12-11 ENCOUNTER — Other Ambulatory Visit (HOSPITAL_COMMUNITY): Payer: Self-pay

## 2021-12-13 ENCOUNTER — Ambulatory Visit (INDEPENDENT_AMBULATORY_CARE_PROVIDER_SITE_OTHER): Payer: Medicare Other | Admitting: Cardiology

## 2021-12-13 ENCOUNTER — Encounter: Payer: Self-pay | Admitting: Cardiology

## 2021-12-13 VITALS — BP 160/74 | HR 75 | Ht 69.5 in | Wt 207.6 lb

## 2021-12-13 DIAGNOSIS — I4821 Permanent atrial fibrillation: Secondary | ICD-10-CM | POA: Diagnosis not present

## 2021-12-13 DIAGNOSIS — E7800 Pure hypercholesterolemia, unspecified: Secondary | ICD-10-CM | POA: Diagnosis not present

## 2021-12-13 DIAGNOSIS — E669 Obesity, unspecified: Secondary | ICD-10-CM

## 2021-12-13 DIAGNOSIS — I10 Essential (primary) hypertension: Secondary | ICD-10-CM

## 2021-12-13 DIAGNOSIS — I25110 Atherosclerotic heart disease of native coronary artery with unstable angina pectoris: Secondary | ICD-10-CM | POA: Diagnosis not present

## 2021-12-13 DIAGNOSIS — E66811 Obesity, class 1: Secondary | ICD-10-CM

## 2021-12-13 HISTORY — DX: Obesity, unspecified: E66.9

## 2021-12-13 HISTORY — DX: Obesity, class 1: E66.811

## 2021-12-13 MED ORDER — AMLODIPINE BESYLATE 5 MG PO TABS: 5.0000 mg | ORAL_TABLET | Freq: Every day | ORAL | 3 refills | Status: DC

## 2021-12-13 NOTE — Progress Notes (Signed)
Cardiology Office Note:    Date:  12/13/2021   ID:  Todd Sampson, DOB [birth year], MRN [id number]  PCP:  Todd Roger, MD  Cardiologist:  Todd Lindau, MD   Referring MD: Todd Roger, MD    ASSESSMENT:    1. Coronary artery disease involving native coronary artery of native heart with unstable angina pectoris ([place])   2. Essential hypertension   3. Hypercholesteremia   4. Permanent atrial fibrillation (HCC)   5. Obesity (BMI 30.0-34.9)    PLAN:    In order of problems listed above:  Coronary artery disease post stenting and non-STEMI: Secondary prevention stressed with the patient.  Importance of compliance with diet medication stressed and vocalized understanding.  He is on dual antiplatelet agents.  He has no issues with this.  I encouraged him to participate in cardiac rehab and he will get in touch with them and we will send him a referral to [facility] for the same.  He was advised to walk on a regular basis.  Results of coronary angiography and echocardiogram discussed with him at length and questions were answered to his satisfaction. Essential hypertension: Blood pressure stable and diet was emphasized.  Lifestyle modification urged.  He has an element of whitecoat hypertension.  Matter-of-fact I told him to keep a track of blood pressure log at home.  I will reduce his Norvasc to 5 mg daily as he has symptoms of orthostatic hypotension.  He also gives history of dizziness and palpitations at times and we will do a 2-week monitor. Dizziness and palpitations: As mentioned above we will do a 2-week monitor. Mixed dyslipidemia: Diet emphasized.  Lifestyle modification urged we will check his lipids in the next few weeks.  Diet emphasized. Patient will be seen in follow-up appointment in 4 weeks or earlier if the patient has any concerns    Medication Adjustments/Labs and Tests Ordered: Current medicines are reviewed at length with the patient today.  Concerns  regarding medicines are outlined above.  No orders of the defined types were placed in this encounter.  No orders of the defined types were placed in this encounter.    Chief Complaint  Patient presents with   Hospitalization Follow-up    Seen at [facility]     History of Present Illness:    Todd Sampson is a 87 y.o. male.  Patient has past medical history of coronary artery disease he recently went to  hospital and was transferred to [facility].  He underwent coronary angiography and underwent intervention..  Non-STEMI.  He denies any problems at this time and takes care of activities of daily living.  No chest pain orthopnea or PND.  At the time of my evaluation, the patient is alert awake oriented and in no distress.  Past Medical History:  Diagnosis Date   Anemia    Barrett's esophagus    BMI 32.0-32.9,adult 11/09/2019   BPH (benign prostatic hyperplasia) 10/17/2021   Calculus of gallbladder with chronic cholecystitis without obstruction 11/09/2019   Coronary artery disease involving native coronary artery of native heart with unstable angina pectoris ([place]) 12/04/2021   Coronary artery disease of native artery of native heart with stable angina pectoris ([place]) 06/06/2016   Diet-controlled diabetes mellitus ([place]) 06/06/2016   Dyslipidemia (high LDL; low HDL) 06/06/2016   Essential hypertension 06/06/2016   GERD (gastroesophageal reflux disease) 10/17/2021   Guillain-Barre disease ([place])    H/O: hemorrhoidectomy    Heart disease  Hx of CABG 10/02/2017   Hypercholesteremia 10/17/2021   Hyperlipidemia    Hypertension    Mitral regurgitation 07/26/2019   Non-intractable vomiting with nausea 11/09/2019   Permanent atrial fibrillation ([place]) 06/06/2016   Postoperative examination 12/13/2019   Preoperative cardiovascular examination 10/02/2017   Primary osteoarthritis of left hip 04/20/2018   Added automatically from request for surgery [id number]  Formatting of this note  might be different from the original. Added automatically from request for surgery [phone]   Prostate cancer [facility])    Salivary gland calculi    Special screening examination for viral disease 11/09/2019   Toe pain, bilateral 09/10/2019   Type 2 diabetes, diet controlled ([place]) 9/98/3382   Umbilical hernia without obstruction and without gangrene 11/09/2019   Unilateral primary osteoarthritis, right hip 04/30/2021    Past Surgical History:  Procedure Laterality Date   APPENDECTOMY     CARDIAC BYPASS     CORONARY STENT INTERVENTION N/A 12/04/2021   Procedure: CORONARY STENT INTERVENTION;  Surgeon: Todd Booze, MD;  Location: [place] [facility];  Service: Cardiovascular;  Laterality: N/A;   LEFT HEART CATH AND CORS/GRAFTS ANGIOGRAPHY N/A 12/04/2021   Procedure: LEFT HEART CATH AND CORS/GRAFTS ANGIOGRAPHY;  Surgeon: Todd Booze, MD;  Location: [place] [facility];  Service: Cardiovascular;  Laterality: N/A;   REPLACEMENT TOTAL KNEE BILATERAL      Current Medications: Current Meds  Medication Sig   acetaminophen (TYLENOL) 500 MG tablet Take 500-1,000 mg by mouth every 6 (six) hours as needed for pain.   amLODipine (NORVASC) 10 MG tablet Take 10 mg by mouth daily.   aspirin EC 81 MG tablet Take 1 tablet (81 mg total) by mouth daily for 14 days. Swallow whole.   clopidogrel (PLAVIX) 75 MG tablet Take 1 tablet (75 mg total) by mouth daily with breakfast.   ELIQUIS 5 MG TABS tablet TAKE ONE TABLET BY MOUTH TWICE DAILY (Patient taking differently: Take 5 mg by mouth 2 (two) times daily.)   finasteride (PROSCAR) 5 MG tablet Take 5 mg by mouth daily.    folic acid (FOLVITE) 1 MG tablet Take 1 mg by mouth daily.   furosemide (LASIX) 40 MG tablet Take 1 tablet (40 mg total) by mouth daily as needed for fluid.   hydrALAZINE (APRESOLINE) 25 MG tablet Take 25 mg by mouth 2 (two) times daily.   hydrochlorothiazide (HYDRODIURIL) 25 MG tablet Take 25 mg by mouth daily.   isosorbide  mononitrate (IMDUR) 120 MG 24 hr tablet Take 1 tablet (120 mg total) by mouth daily.   lisinopril (ZESTRIL) 40 MG tablet Take 40 mg by mouth daily.   nitroGLYCERIN (NITROSTAT) 0.4 MG SL tablet Place 1 tablet (0.4 mg total) under the tongue every 5 (five) minutes as needed for chest pain. (Patient taking differently: Place 0.4 mg under the tongue every 5 (five) minutes as needed for chest pain.)   Omega-3 Fatty Acids (FISH OIL) 1000 MG CAPS Take 1,000 mg by mouth daily.   pantoprazole (PROTONIX) 40 MG tablet Take 40 mg by mouth daily.   rosuvastatin (CRESTOR) 20 MG tablet Take 1 tablet (20 mg total) by mouth daily.   thiamine 100 MG tablet Take 100 mg by mouth daily.     Allergies:   Penicillins   Social History   Socioeconomic History   Marital status: Married    Spouse name: Not on file   Number of children: Not on file   Years of education: Not on file   Highest  education level: Not on file  Occupational History   Not on file  Tobacco Use   Smoking status: Former   Smokeless tobacco: Never  Vaping Use   Vaping Use: Never used  Substance and Sexual Activity   Alcohol use: No   Drug use: No   Sexual activity: Not on file  Other Topics Concern   Not on file  Social History Narrative   Not on file   Social Determinants of Health   Financial Resource Strain: Not on file  Food Insecurity: Not on file  Transportation Needs: Not on file  Physical Activity: Not on file  Stress: Not on file  Social Connections: Not on file     Family History: The patient's family history includes Cancer in his brother and mother; Heart disease in his brother, father, and sister.  ROS:   Please see the history of present illness.    All other systems reviewed and are negative.  EKGs/Labs/Other Studies Reviewed:    The following studies were reviewed today: IMPRESSIONS     1. Left ventricular ejection fraction, by estimation, is 55 to 60%. The  left ventricle has normal function. The  left ventricle has no regional  wall motion abnormalities. There is mild left ventricular hypertrophy.  Left ventricular diastolic parameters  are indeterminate.   2. Right ventricular systolic function is mildly reduced. The right  ventricular size is mildly enlarged. There is normal pulmonary artery  systolic pressure. The estimated right ventricular systolic pressure is  25.6 mmHg.   3. Left atrial size was moderately dilated.   4. Right atrial size was moderately dilated.   5. The mitral valve is degenerative. Mild mitral valve regurgitation.  Moderate mitral annular calcification.   6. The aortic valve is tricuspid. There is moderate calcification of the  aortic valve. There is moderate thickening of the aortic valve. Aortic  valve regurgitation is trivial.   7. Aortic dilatation noted. There is borderline dilatation of the  ascending aorta, measuring 36 mm.   8. The inferior vena cava is dilated in size with >50% respiratory  variability, suggesting right atrial pressure of 8 mmHg.   CORONARY STENT INTERVENTION  LEFT HEART CATH AND CORS/GRAFTS ANGIOGRAPHY   Conclusion      Mid RCA lesion is 50% stenosed.  Calcified lesion.   RV Branch lesion is 75% stenosed.   Prox RCA lesion is 40% stenosed.   Mid LAD lesion is 50% stenosed.  LIMA was not grafted to the heart.   2nd Diag lesion is 75% stenosed.  Small vessel.   Dist LAD lesion is 70% stenosed.  Severely calcified lesion.   Mid Cx lesion is 100% stenosed.  SVG to OM with Dist Graft lesion is 95% stenosed.   A drug-eluting stent was successfully placed using a SYNERGY XD [id number].   Post intervention, there is a 0% residual stenosis.   LV end diastolic pressure is normal.   There is no aortic valve stenosis.   Patient reports that he had CABG x3.  There is only one graft marker.  We engaged the LIMA which did not appear to go to the heart.   Successful PCI of the distal SVG to OM.  Transient no reflow successfully treated  with intracoronary verapamil.  Brilinta was given prior to the procedure.  Given that he is on Eliquis, will transition to clopidogrel tomorrow.  Plan for 30 days of aspirin, clopidogrel for 6 months and Eliquis indefinitely in the setting of atrial fibrillation.  Given issues with bradycardia during the procedure, we will watch overnight in the ICU.   Recent Labs: 12/05/2021: B Natriuretic Peptide 494.2; BUN 18; Creatinine, Ser 1.07; Hemoglobin 14.7; Platelets 166; Potassium 3.5; Sodium 135  Recent Lipid Panel    Component Value Date/Time   CHOL 102 08/05/2019 0818   TRIG 60 08/05/2019 0818   HDL 30 (L) 08/05/2019 0818   CHOLHDL 3.4 08/05/2019 0818   LDLCALC 58 08/05/2019 0818    Physical Exam:    VS:  BP (!) 160/74 (BP Location: Left Arm, Patient Position: Sitting)   Pulse 75   Ht 5' 9.5" (1.765 m)   Wt 207 lb 9.6 oz (94.2 kg)   SpO2 96%   BMI 30.22 kg/m     Wt Readings from Last 3 Encounters:  12/13/21 207 lb 9.6 oz (94.2 kg)  12/04/21 203 lb 14.8 oz (92.5 kg)  10/22/21 203 lb 12.8 oz (92.4 kg)     GEN: Patient is in no acute distress HEENT: Normal NECK: No JVD; No carotid bruits LYMPHATICS: No lymphadenopathy CARDIAC: Hear sounds regular, 2/6 systolic murmur at the apex. RESPIRATORY:  Clear to auscultation without rales, wheezing or rhonchi  ABDOMEN: Soft, non-tender, non-distended MUSCULOSKELETAL:  No edema; No deformity  SKIN: Warm and dry NEUROLOGIC:  Alert and oriented x 3 PSYCHIATRIC:  Normal affect   Signed, Todd Lindau, MD  12/13/2021 1:30 PM    [place]

## 2021-12-13 NOTE — Patient Instructions (Addendum)
Medication Instructions:  Your physician has recommended you make the following change in your medication: Decrease Norvasc to '5mg'$  1 tablet daily by mouth - you may 1/2 your current dose and your next dose will be 5 mg tablets to take 1 daily.   Lab Work: None Ordered If you have labs (blood work) drawn today and your tests are completely normal, you will receive your results only by: [place] (if you have MyChart) OR A paper copy in the mail If you have any lab test that is abnormal or we need to change your treatment, we will call you to review the results.   Testing/Procedures: None Ordered   Follow-Up: At [facility], you and your health needs are our priority.  As part of our continuing mission to provide you with exceptional heart care, we have created designated Provider Care Teams.  These Care Teams include your primary Cardiologist (physician) and Advanced Practice Providers (APPs -  Physician Assistants and Nurse Practitioners) who all work together to provide you with the care you need, when you need it.  We recommend signing up for the patient portal called "MyChart".  Sign up information is provided on this After Visit Summary.  MyChart is used to connect with patients for Virtual Visits (Telemedicine).  Patients are able to view lab/test results, encounter notes, upcoming appointments, etc.  Non-urgent messages can be sent to your provider as well.   To learn more about what you can do with MyChart, go to [email or web address].    Your next appointment:   1 month(s)  The format for your next appointment:   In Person  Provider:   Jyl Heinz, MD    Other Instructions:  Blood Pressure Record Sheet To take your blood pressure, you will need a blood pressure machine. You can buy a blood pressure machine (blood pressure monitor) at your clinic, drug store, or online. When choosing one, consider: An automatic monitor that has an arm cuff. A cuff that wraps  snugly around your upper arm. You should be able to fit only one finger between your arm and the cuff. A device that stores blood pressure reading results. Do not choose a monitor that measures your blood pressure from your wrist or finger. Follow your health care provider's instructions for how to take your blood pressure. To use this form: Get one reading in the morning (a.m.) 1-2 hours after you take any medicines. Get one reading in the evening (p.m.) before supper. Take at least 2 readings with each blood pressure check. This makes sure the results are correct. Wait 1-2 minutes between measurements. Write down the results in the spaces on this form. Repeat this once a week, or as told by your health care provider.  Make a follow-up appointment with your health care provider to discuss the results. Blood pressure log Date: _______________________ a.m. _____________________(1st reading) HR___________            p.m. _____________________(2nd reading) HR__________  Date: _______________________ a.m. _____________________(1st reading) HR___________            p.m. _____________________(2nd reading) HR__________ Date: _______________________ a.m. _____________________(1st reading) HR___________            p.m. _____________________(2nd reading) HR__________ Date: _______________________ a.m. _____________________(1st reading) HR___________            p.m. _____________________(2nd reading) HR__________  Date: _______________________ a.m. _____________________(1st reading) HR___________            p.m. _____________________(2nd reading) HR__________  Date: _______________________ a.m. _____________________(1st reading)  HR___________            p.m. _____________________(2nd reading) HR__________  Date: _______________________ a.m. _____________________(1st reading) HR___________            p.m. _____________________(2nd reading) HR__________   This information is not  intended to replace advice given to you by your health care provider. Make sure you discuss any questions you have with your health care provider. Document Revised: 10/06/2019 Document Reviewed: 10/06/2019 Elsevier Patient Education  2021 [place].    Referral to Cardiac Rehab at [facility].

## 2021-12-14 ENCOUNTER — Telehealth (HOSPITAL_COMMUNITY): Payer: Self-pay

## 2021-12-14 NOTE — Telephone Encounter (Signed)
Per phase I cardiac rehab, fax cardiac rehab referral to [place] [facility]. °

## 2021-12-26 DIAGNOSIS — E785 Hyperlipidemia, unspecified: Secondary | ICD-10-CM | POA: Diagnosis not present

## 2021-12-26 DIAGNOSIS — Z955 Presence of coronary angioplasty implant and graft: Secondary | ICD-10-CM | POA: Diagnosis not present

## 2021-12-26 DIAGNOSIS — I10 Essential (primary) hypertension: Secondary | ICD-10-CM | POA: Diagnosis not present

## 2021-12-26 DIAGNOSIS — I4891 Unspecified atrial fibrillation: Secondary | ICD-10-CM | POA: Diagnosis not present

## 2021-12-26 DIAGNOSIS — E119 Type 2 diabetes mellitus without complications: Secondary | ICD-10-CM | POA: Diagnosis not present

## 2021-12-26 DIAGNOSIS — Z951 Presence of aortocoronary bypass graft: Secondary | ICD-10-CM | POA: Diagnosis not present

## 2021-12-28 DIAGNOSIS — I4891 Unspecified atrial fibrillation: Secondary | ICD-10-CM | POA: Diagnosis not present

## 2021-12-28 DIAGNOSIS — E785 Hyperlipidemia, unspecified: Secondary | ICD-10-CM | POA: Diagnosis not present

## 2021-12-28 DIAGNOSIS — Z955 Presence of coronary angioplasty implant and graft: Secondary | ICD-10-CM | POA: Diagnosis not present

## 2021-12-28 DIAGNOSIS — E119 Type 2 diabetes mellitus without complications: Secondary | ICD-10-CM | POA: Diagnosis not present

## 2021-12-28 DIAGNOSIS — Z951 Presence of aortocoronary bypass graft: Secondary | ICD-10-CM | POA: Diagnosis not present

## 2021-12-28 DIAGNOSIS — I10 Essential (primary) hypertension: Secondary | ICD-10-CM | POA: Diagnosis not present

## 2021-12-31 DIAGNOSIS — Z955 Presence of coronary angioplasty implant and graft: Secondary | ICD-10-CM | POA: Diagnosis not present

## 2021-12-31 DIAGNOSIS — I10 Essential (primary) hypertension: Secondary | ICD-10-CM | POA: Diagnosis not present

## 2021-12-31 DIAGNOSIS — E119 Type 2 diabetes mellitus without complications: Secondary | ICD-10-CM | POA: Diagnosis not present

## 2021-12-31 DIAGNOSIS — E785 Hyperlipidemia, unspecified: Secondary | ICD-10-CM | POA: Diagnosis not present

## 2021-12-31 DIAGNOSIS — Z951 Presence of aortocoronary bypass graft: Secondary | ICD-10-CM | POA: Diagnosis not present

## 2021-12-31 DIAGNOSIS — I4891 Unspecified atrial fibrillation: Secondary | ICD-10-CM | POA: Diagnosis not present

## 2022-01-02 DIAGNOSIS — I10 Essential (primary) hypertension: Secondary | ICD-10-CM | POA: Diagnosis not present

## 2022-01-02 DIAGNOSIS — Z951 Presence of aortocoronary bypass graft: Secondary | ICD-10-CM | POA: Diagnosis not present

## 2022-01-02 DIAGNOSIS — Z955 Presence of coronary angioplasty implant and graft: Secondary | ICD-10-CM | POA: Diagnosis not present

## 2022-01-02 DIAGNOSIS — E119 Type 2 diabetes mellitus without complications: Secondary | ICD-10-CM | POA: Diagnosis not present

## 2022-01-02 DIAGNOSIS — I4891 Unspecified atrial fibrillation: Secondary | ICD-10-CM | POA: Diagnosis not present

## 2022-01-02 DIAGNOSIS — E785 Hyperlipidemia, unspecified: Secondary | ICD-10-CM | POA: Diagnosis not present

## 2022-01-04 DIAGNOSIS — E785 Hyperlipidemia, unspecified: Secondary | ICD-10-CM | POA: Diagnosis not present

## 2022-01-04 DIAGNOSIS — E119 Type 2 diabetes mellitus without complications: Secondary | ICD-10-CM | POA: Diagnosis not present

## 2022-01-04 DIAGNOSIS — I10 Essential (primary) hypertension: Secondary | ICD-10-CM | POA: Diagnosis not present

## 2022-01-04 DIAGNOSIS — I4891 Unspecified atrial fibrillation: Secondary | ICD-10-CM | POA: Diagnosis not present

## 2022-01-04 DIAGNOSIS — Z951 Presence of aortocoronary bypass graft: Secondary | ICD-10-CM | POA: Diagnosis not present

## 2022-01-04 DIAGNOSIS — Z955 Presence of coronary angioplasty implant and graft: Secondary | ICD-10-CM | POA: Diagnosis not present

## 2022-01-07 DIAGNOSIS — I10 Essential (primary) hypertension: Secondary | ICD-10-CM | POA: Diagnosis not present

## 2022-01-07 DIAGNOSIS — E785 Hyperlipidemia, unspecified: Secondary | ICD-10-CM | POA: Diagnosis not present

## 2022-01-07 DIAGNOSIS — I4891 Unspecified atrial fibrillation: Secondary | ICD-10-CM | POA: Diagnosis not present

## 2022-01-07 DIAGNOSIS — Z955 Presence of coronary angioplasty implant and graft: Secondary | ICD-10-CM | POA: Diagnosis not present

## 2022-01-07 DIAGNOSIS — Z951 Presence of aortocoronary bypass graft: Secondary | ICD-10-CM | POA: Diagnosis not present

## 2022-01-07 DIAGNOSIS — E119 Type 2 diabetes mellitus without complications: Secondary | ICD-10-CM | POA: Diagnosis not present

## 2022-01-09 DIAGNOSIS — E785 Hyperlipidemia, unspecified: Secondary | ICD-10-CM | POA: Diagnosis not present

## 2022-01-09 DIAGNOSIS — I4891 Unspecified atrial fibrillation: Secondary | ICD-10-CM | POA: Diagnosis not present

## 2022-01-09 DIAGNOSIS — E119 Type 2 diabetes mellitus without complications: Secondary | ICD-10-CM | POA: Diagnosis not present

## 2022-01-09 DIAGNOSIS — Z955 Presence of coronary angioplasty implant and graft: Secondary | ICD-10-CM | POA: Diagnosis not present

## 2022-01-09 DIAGNOSIS — I10 Essential (primary) hypertension: Secondary | ICD-10-CM | POA: Diagnosis not present

## 2022-01-09 DIAGNOSIS — Z951 Presence of aortocoronary bypass graft: Secondary | ICD-10-CM | POA: Diagnosis not present

## 2022-01-11 DIAGNOSIS — E785 Hyperlipidemia, unspecified: Secondary | ICD-10-CM | POA: Diagnosis not present

## 2022-01-11 DIAGNOSIS — Z955 Presence of coronary angioplasty implant and graft: Secondary | ICD-10-CM | POA: Diagnosis not present

## 2022-01-11 DIAGNOSIS — I10 Essential (primary) hypertension: Secondary | ICD-10-CM | POA: Diagnosis not present

## 2022-01-11 DIAGNOSIS — E119 Type 2 diabetes mellitus without complications: Secondary | ICD-10-CM | POA: Diagnosis not present

## 2022-01-11 DIAGNOSIS — I4891 Unspecified atrial fibrillation: Secondary | ICD-10-CM | POA: Diagnosis not present

## 2022-01-11 DIAGNOSIS — Z951 Presence of aortocoronary bypass graft: Secondary | ICD-10-CM | POA: Diagnosis not present

## 2022-01-14 ENCOUNTER — Ambulatory Visit (INDEPENDENT_AMBULATORY_CARE_PROVIDER_SITE_OTHER): Payer: Medicare Other | Admitting: Cardiology

## 2022-01-14 ENCOUNTER — Encounter: Payer: Self-pay | Admitting: Cardiology

## 2022-01-14 ENCOUNTER — Ambulatory Visit: Payer: Medicare Other | Admitting: Cardiology

## 2022-01-14 VITALS — BP 146/80 | HR 70 | Ht 69.0 in | Wt 208.6 lb

## 2022-01-14 DIAGNOSIS — Z951 Presence of aortocoronary bypass graft: Secondary | ICD-10-CM | POA: Diagnosis not present

## 2022-01-14 DIAGNOSIS — E782 Mixed hyperlipidemia: Secondary | ICD-10-CM

## 2022-01-14 DIAGNOSIS — I25110 Atherosclerotic heart disease of native coronary artery with unstable angina pectoris: Secondary | ICD-10-CM

## 2022-01-14 DIAGNOSIS — I10 Essential (primary) hypertension: Secondary | ICD-10-CM

## 2022-01-14 DIAGNOSIS — I4821 Permanent atrial fibrillation: Secondary | ICD-10-CM

## 2022-01-14 NOTE — Progress Notes (Signed)
Cardiology Office Note:    Date:  01/14/2022   ID:  Todd Sampson, DOB 1934/12/20, MRN [id number]  PCP:  Townsend Roger, MD  Cardiologist:  Jenean Lindau, MD   Referring MD: Townsend Roger, MD    ASSESSMENT:    1. Coronary artery disease involving native coronary artery of native heart with unstable angina pectoris ([place])   2. Essential hypertension   3. Mixed hyperlipidemia   4. Permanent atrial fibrillation ([place])   5. Hx of CABG    PLAN:    In order of problems listed above:  Coronary artery disease: Secondary prevention stressed with the patient.Importance of compliance with diet medication stressed any vocalized understanding.  He is very meticulous about cardiac rehab and is enjoying it also. Essential hypertension: Blood pressure stable and diet was emphasized.  He keeps a track of his blood pressures at home and they are fine Mixed dyslipidemia: Diet emphasized.  He will have blood work next week at primary care and send me a copy. Diabetes mellitus: Managed by primary care.  He is diet controlled. Permanent atrial fibrillation:I discussed with the patient atrial fibrillation, disease process. Management and therapy including rate and rhythm control, anticoagulation benefits and potential risks were discussed extensively with the patient. Patient had multiple questions which were answered to patient's satisfaction. Patient will be seen in follow-up appointment in 6 months or earlier if the patient has any concerns    Medication Adjustments/Labs and Tests Ordered: Current medicines are reviewed at length with the patient today.  Concerns regarding medicines are outlined above.  No orders of the defined types were placed in this encounter.  No orders of the defined types were placed in this encounter.    No chief complaint on file.    History of Present Illness:    Todd Sampson is a 87 y.o. male.  Patient has past medical history of coronary artery disease,  essential hypertension, mixed dyslipidemia and diet-controlled diabetes mellitus.  He denies any problems at this time and takes care of activities of daily living.  No chest pain orthopnea or PND.  At the time of my evaluation, the patient is alert awake oriented and in no distress.  He is doing very well in cardiac rehab.  Past Medical History:  Diagnosis Date   Anemia    Barrett's esophagus    BMI 32.0-32.9,adult 11/09/2019   BPH (benign prostatic hyperplasia) 10/17/2021   Calculus of gallbladder with chronic cholecystitis without obstruction 11/09/2019   Coronary artery disease involving native coronary artery of native heart with unstable angina pectoris ([place]) 12/04/2021   Coronary artery disease of native artery of native heart with stable angina pectoris ([place]) 06/06/2016   Diet-controlled diabetes mellitus ([place]) 06/06/2016   Dyslipidemia (high LDL; low HDL) 06/06/2016   Essential hypertension 06/06/2016   GERD (gastroesophageal reflux disease) 10/17/2021   Guillain-Barre disease ([place])    H/O: hemorrhoidectomy    Heart disease    Hx of CABG 10/02/2017   Hypercholesteremia 10/17/2021   Hyperlipidemia    Hypertension    Mitral regurgitation 07/26/2019   Non-intractable vomiting with nausea 11/09/2019   Obesity (BMI 30.0-34.9) 12/13/2021   Permanent atrial fibrillation ([place]) 06/06/2016   Postoperative examination 12/13/2019   Preoperative cardiovascular examination 10/02/2017   Primary osteoarthritis of left hip 04/20/2018   Added automatically from request for surgery [id number]  Formatting of this note might be different from the original. Added automatically from request for surgery [id number]   Prostate cancer ([place])  Salivary gland calculi    Special screening examination for viral disease 11/09/2019   Toe pain, bilateral 09/10/2019   Type 2 diabetes, diet controlled ([place]) 9/52/8413   Umbilical hernia without obstruction and without gangrene 11/09/2019   Unilateral primary  osteoarthritis, right hip 04/30/2021    Past Surgical History:  Procedure Laterality Date   APPENDECTOMY     CARDIAC BYPASS     CORONARY STENT INTERVENTION N/A 12/04/2021   Procedure: CORONARY STENT INTERVENTION;  Surgeon: Jettie Booze, MD;  Location: [place] [facility];  Service: Cardiovascular;  Laterality: N/A;   LEFT HEART CATH AND CORS/GRAFTS ANGIOGRAPHY N/A 12/04/2021   Procedure: LEFT HEART CATH AND CORS/GRAFTS ANGIOGRAPHY;  Surgeon: Jettie Booze, MD;  Location: [place] [facility];  Service: Cardiovascular;  Laterality: N/A;   REPLACEMENT TOTAL KNEE BILATERAL      Current Medications: Current Meds  Medication Sig   acetaminophen (TYLENOL) 500 MG tablet Take 500-1,000 mg by mouth every 6 (six) hours as needed for pain.   amLODipine (NORVASC) 5 MG tablet Take 1 tablet (5 mg total) by mouth daily.   clopidogrel (PLAVIX) 75 MG tablet Take 1 tablet (75 mg total) by mouth daily with breakfast.   ELIQUIS 5 MG TABS tablet TAKE ONE TABLET BY MOUTH TWICE DAILY   finasteride (PROSCAR) 5 MG tablet Take 5 mg by mouth daily.    folic acid (FOLVITE) 1 MG tablet Take 1 mg by mouth daily.   furosemide (LASIX) 40 MG tablet Take 1 tablet (40 mg total) by mouth daily as needed for fluid.   hydrALAZINE (APRESOLINE) 25 MG tablet Take 25 mg by mouth 2 (two) times daily.   hydrochlorothiazide (HYDRODIURIL) 25 MG tablet Take 25 mg by mouth daily.   isosorbide mononitrate (IMDUR) 120 MG 24 hr tablet Take 1 tablet (120 mg total) by mouth daily.   lisinopril (ZESTRIL) 40 MG tablet Take 40 mg by mouth daily.   nitroGLYCERIN (NITROSTAT) 0.4 MG SL tablet Place 1 tablet (0.4 mg total) under the tongue every 5 (five) minutes as needed for chest pain.   Omega-3 Fatty Acids (FISH OIL) 1000 MG CAPS Take 1,000 mg by mouth daily.   pantoprazole (PROTONIX) 40 MG tablet Take 40 mg by mouth daily.   rosuvastatin (CRESTOR) 20 MG tablet Take 1 tablet (20 mg total) by mouth daily.   thiamine 100 MG tablet  Take 100 mg by mouth daily.     Allergies:   Penicillins   Social History   Socioeconomic History   Marital status: Married    Spouse name: Not on file   Number of children: Not on file   Years of education: Not on file   Highest education level: Not on file  Occupational History   Not on file  Tobacco Use   Smoking status: Former   Smokeless tobacco: Never  Vaping Use   Vaping Use: Never used  Substance and Sexual Activity   Alcohol use: No   Drug use: No   Sexual activity: Not on file  Other Topics Concern   Not on file  Social History Narrative   Not on file   Social Determinants of Health   Financial Resource Strain: Not on file  Food Insecurity: Not on file  Transportation Needs: Not on file  Physical Activity: Not on file  Stress: Not on file  Social Connections: Not on file     Family History: The patient's family history includes Cancer in his brother and mother; Heart disease  in his brother, father, and sister.  ROS:   Please see the history of present illness.    All other systems reviewed and are negative.  EKGs/Labs/Other Studies Reviewed:    The following studies were reviewed today: I discussed my findings with the patient at length   Recent Labs: 12/05/2021: B Natriuretic Peptide 494.2; BUN 18; Creatinine, Ser 1.07; Hemoglobin 14.7; Platelets 166; Potassium 3.5; Sodium 135  Recent Lipid Panel    Component Value Date/Time   CHOL 102 08/05/2019 0818   TRIG 60 08/05/2019 0818   HDL 30 (L) 08/05/2019 0818   CHOLHDL 3.4 08/05/2019 0818   LDLCALC 58 08/05/2019 0818    Physical Exam:    VS:  BP (!) 146/80   Pulse 70   Ht '5\' 9"'$  (1.753 m)   Wt 208 lb 9.6 oz (94.6 kg)   SpO2 94%   BMI 30.80 kg/m     Wt Readings from Last 3 Encounters:  01/14/22 208 lb 9.6 oz (94.6 kg)  12/13/21 207 lb 9.6 oz (94.2 kg)  12/04/21 203 lb 14.8 oz (92.5 kg)     GEN: Patient is in no acute distress HEENT: Normal NECK: No JVD; No carotid  bruits LYMPHATICS: No lymphadenopathy CARDIAC: Hear sounds regular, 2/6 systolic murmur at the apex. RESPIRATORY:  Clear to auscultation without rales, wheezing or rhonchi  ABDOMEN: Soft, non-tender, non-distended MUSCULOSKELETAL:  No edema; No deformity  SKIN: Warm and dry NEUROLOGIC:  Alert and oriented x 3 PSYCHIATRIC:  Normal affect   Signed, Jenean Lindau, MD  01/14/2022 11:55 AM    [place]

## 2022-01-14 NOTE — Patient Instructions (Signed)

## 2022-01-16 DIAGNOSIS — E785 Hyperlipidemia, unspecified: Secondary | ICD-10-CM | POA: Diagnosis not present

## 2022-01-16 DIAGNOSIS — Z951 Presence of aortocoronary bypass graft: Secondary | ICD-10-CM | POA: Diagnosis not present

## 2022-01-16 DIAGNOSIS — I10 Essential (primary) hypertension: Secondary | ICD-10-CM | POA: Diagnosis not present

## 2022-01-16 DIAGNOSIS — E119 Type 2 diabetes mellitus without complications: Secondary | ICD-10-CM | POA: Diagnosis not present

## 2022-01-16 DIAGNOSIS — Z955 Presence of coronary angioplasty implant and graft: Secondary | ICD-10-CM | POA: Diagnosis not present

## 2022-01-16 DIAGNOSIS — I4891 Unspecified atrial fibrillation: Secondary | ICD-10-CM | POA: Diagnosis not present

## 2022-01-18 DIAGNOSIS — E785 Hyperlipidemia, unspecified: Secondary | ICD-10-CM | POA: Diagnosis not present

## 2022-01-18 DIAGNOSIS — E119 Type 2 diabetes mellitus without complications: Secondary | ICD-10-CM | POA: Diagnosis not present

## 2022-01-18 DIAGNOSIS — Z955 Presence of coronary angioplasty implant and graft: Secondary | ICD-10-CM | POA: Diagnosis not present

## 2022-01-18 DIAGNOSIS — I10 Essential (primary) hypertension: Secondary | ICD-10-CM | POA: Diagnosis not present

## 2022-01-18 DIAGNOSIS — I4891 Unspecified atrial fibrillation: Secondary | ICD-10-CM | POA: Diagnosis not present

## 2022-01-18 DIAGNOSIS — Z951 Presence of aortocoronary bypass graft: Secondary | ICD-10-CM | POA: Diagnosis not present

## 2022-01-21 DIAGNOSIS — E785 Hyperlipidemia, unspecified: Secondary | ICD-10-CM | POA: Diagnosis not present

## 2022-01-21 DIAGNOSIS — I10 Essential (primary) hypertension: Secondary | ICD-10-CM | POA: Diagnosis not present

## 2022-01-21 DIAGNOSIS — I4891 Unspecified atrial fibrillation: Secondary | ICD-10-CM | POA: Diagnosis not present

## 2022-01-21 DIAGNOSIS — Z951 Presence of aortocoronary bypass graft: Secondary | ICD-10-CM | POA: Diagnosis not present

## 2022-01-21 DIAGNOSIS — Z955 Presence of coronary angioplasty implant and graft: Secondary | ICD-10-CM | POA: Diagnosis not present

## 2022-01-21 DIAGNOSIS — E119 Type 2 diabetes mellitus without complications: Secondary | ICD-10-CM | POA: Diagnosis not present

## 2022-01-25 DIAGNOSIS — E119 Type 2 diabetes mellitus without complications: Secondary | ICD-10-CM | POA: Diagnosis not present

## 2022-01-25 DIAGNOSIS — Z955 Presence of coronary angioplasty implant and graft: Secondary | ICD-10-CM | POA: Diagnosis not present

## 2022-01-25 DIAGNOSIS — I10 Essential (primary) hypertension: Secondary | ICD-10-CM | POA: Diagnosis not present

## 2022-01-25 DIAGNOSIS — Z951 Presence of aortocoronary bypass graft: Secondary | ICD-10-CM | POA: Diagnosis not present

## 2022-01-25 DIAGNOSIS — I4891 Unspecified atrial fibrillation: Secondary | ICD-10-CM | POA: Diagnosis not present

## 2022-01-25 DIAGNOSIS — E785 Hyperlipidemia, unspecified: Secondary | ICD-10-CM | POA: Diagnosis not present

## 2022-01-28 DIAGNOSIS — E119 Type 2 diabetes mellitus without complications: Secondary | ICD-10-CM | POA: Diagnosis not present

## 2022-01-28 DIAGNOSIS — Z951 Presence of aortocoronary bypass graft: Secondary | ICD-10-CM | POA: Diagnosis not present

## 2022-01-28 DIAGNOSIS — Z955 Presence of coronary angioplasty implant and graft: Secondary | ICD-10-CM | POA: Diagnosis not present

## 2022-01-28 DIAGNOSIS — I4891 Unspecified atrial fibrillation: Secondary | ICD-10-CM | POA: Diagnosis not present

## 2022-01-28 DIAGNOSIS — E785 Hyperlipidemia, unspecified: Secondary | ICD-10-CM | POA: Diagnosis not present

## 2022-01-28 DIAGNOSIS — I10 Essential (primary) hypertension: Secondary | ICD-10-CM | POA: Diagnosis not present

## 2022-01-30 DIAGNOSIS — E119 Type 2 diabetes mellitus without complications: Secondary | ICD-10-CM | POA: Diagnosis not present

## 2022-01-30 DIAGNOSIS — Z955 Presence of coronary angioplasty implant and graft: Secondary | ICD-10-CM | POA: Diagnosis not present

## 2022-01-30 DIAGNOSIS — I4891 Unspecified atrial fibrillation: Secondary | ICD-10-CM | POA: Diagnosis not present

## 2022-01-30 DIAGNOSIS — I10 Essential (primary) hypertension: Secondary | ICD-10-CM | POA: Diagnosis not present

## 2022-01-30 DIAGNOSIS — Z951 Presence of aortocoronary bypass graft: Secondary | ICD-10-CM | POA: Diagnosis not present

## 2022-01-30 DIAGNOSIS — E785 Hyperlipidemia, unspecified: Secondary | ICD-10-CM | POA: Diagnosis not present

## 2022-02-01 DIAGNOSIS — Z955 Presence of coronary angioplasty implant and graft: Secondary | ICD-10-CM | POA: Diagnosis not present

## 2022-02-01 DIAGNOSIS — I10 Essential (primary) hypertension: Secondary | ICD-10-CM | POA: Diagnosis not present

## 2022-02-01 DIAGNOSIS — I4891 Unspecified atrial fibrillation: Secondary | ICD-10-CM | POA: Diagnosis not present

## 2022-02-01 DIAGNOSIS — E119 Type 2 diabetes mellitus without complications: Secondary | ICD-10-CM | POA: Diagnosis not present

## 2022-02-01 DIAGNOSIS — E785 Hyperlipidemia, unspecified: Secondary | ICD-10-CM | POA: Diagnosis not present

## 2022-02-01 DIAGNOSIS — Z951 Presence of aortocoronary bypass graft: Secondary | ICD-10-CM | POA: Diagnosis not present

## 2022-02-04 DIAGNOSIS — Z955 Presence of coronary angioplasty implant and graft: Secondary | ICD-10-CM | POA: Diagnosis not present

## 2022-02-04 DIAGNOSIS — E119 Type 2 diabetes mellitus without complications: Secondary | ICD-10-CM | POA: Diagnosis not present

## 2022-02-04 DIAGNOSIS — I10 Essential (primary) hypertension: Secondary | ICD-10-CM | POA: Diagnosis not present

## 2022-02-04 DIAGNOSIS — E785 Hyperlipidemia, unspecified: Secondary | ICD-10-CM | POA: Diagnosis not present

## 2022-02-04 DIAGNOSIS — I4891 Unspecified atrial fibrillation: Secondary | ICD-10-CM | POA: Diagnosis not present

## 2022-02-04 DIAGNOSIS — Z951 Presence of aortocoronary bypass graft: Secondary | ICD-10-CM | POA: Diagnosis not present

## 2022-02-06 DIAGNOSIS — Z951 Presence of aortocoronary bypass graft: Secondary | ICD-10-CM | POA: Diagnosis not present

## 2022-02-06 DIAGNOSIS — I10 Essential (primary) hypertension: Secondary | ICD-10-CM | POA: Diagnosis not present

## 2022-02-06 DIAGNOSIS — E119 Type 2 diabetes mellitus without complications: Secondary | ICD-10-CM | POA: Diagnosis not present

## 2022-02-06 DIAGNOSIS — Z955 Presence of coronary angioplasty implant and graft: Secondary | ICD-10-CM | POA: Diagnosis not present

## 2022-02-06 DIAGNOSIS — R351 Nocturia: Secondary | ICD-10-CM | POA: Diagnosis not present

## 2022-02-06 DIAGNOSIS — E785 Hyperlipidemia, unspecified: Secondary | ICD-10-CM | POA: Diagnosis not present

## 2022-02-06 DIAGNOSIS — C61 Malignant neoplasm of prostate: Secondary | ICD-10-CM | POA: Diagnosis not present

## 2022-02-06 DIAGNOSIS — I4891 Unspecified atrial fibrillation: Secondary | ICD-10-CM | POA: Diagnosis not present

## 2022-02-08 DIAGNOSIS — Z951 Presence of aortocoronary bypass graft: Secondary | ICD-10-CM | POA: Diagnosis not present

## 2022-02-08 DIAGNOSIS — Z955 Presence of coronary angioplasty implant and graft: Secondary | ICD-10-CM | POA: Diagnosis not present

## 2022-02-08 DIAGNOSIS — E7800 Pure hypercholesterolemia, unspecified: Secondary | ICD-10-CM | POA: Diagnosis not present

## 2022-02-08 DIAGNOSIS — Z8546 Personal history of malignant neoplasm of prostate: Secondary | ICD-10-CM | POA: Diagnosis not present

## 2022-02-08 DIAGNOSIS — E785 Hyperlipidemia, unspecified: Secondary | ICD-10-CM | POA: Diagnosis not present

## 2022-02-08 DIAGNOSIS — M810 Age-related osteoporosis without current pathological fracture: Secondary | ICD-10-CM | POA: Diagnosis not present

## 2022-02-08 DIAGNOSIS — I4891 Unspecified atrial fibrillation: Secondary | ICD-10-CM | POA: Diagnosis not present

## 2022-02-08 DIAGNOSIS — Z0000 Encounter for general adult medical examination without abnormal findings: Secondary | ICD-10-CM | POA: Diagnosis not present

## 2022-02-08 DIAGNOSIS — H9190 Unspecified hearing loss, unspecified ear: Secondary | ICD-10-CM | POA: Diagnosis not present

## 2022-02-08 DIAGNOSIS — Z6829 Body mass index (BMI) 29.0-29.9, adult: Secondary | ICD-10-CM | POA: Diagnosis not present

## 2022-02-08 DIAGNOSIS — Z1331 Encounter for screening for depression: Secondary | ICD-10-CM | POA: Diagnosis not present

## 2022-02-08 DIAGNOSIS — I10 Essential (primary) hypertension: Secondary | ICD-10-CM | POA: Diagnosis not present

## 2022-02-08 DIAGNOSIS — E119 Type 2 diabetes mellitus without complications: Secondary | ICD-10-CM | POA: Diagnosis not present

## 2022-02-13 DIAGNOSIS — E785 Hyperlipidemia, unspecified: Secondary | ICD-10-CM | POA: Diagnosis not present

## 2022-02-13 DIAGNOSIS — Z955 Presence of coronary angioplasty implant and graft: Secondary | ICD-10-CM | POA: Diagnosis not present

## 2022-02-13 DIAGNOSIS — I4891 Unspecified atrial fibrillation: Secondary | ICD-10-CM | POA: Diagnosis not present

## 2022-02-13 DIAGNOSIS — E119 Type 2 diabetes mellitus without complications: Secondary | ICD-10-CM | POA: Diagnosis not present

## 2022-02-13 DIAGNOSIS — I10 Essential (primary) hypertension: Secondary | ICD-10-CM | POA: Diagnosis not present

## 2022-02-13 DIAGNOSIS — Z951 Presence of aortocoronary bypass graft: Secondary | ICD-10-CM | POA: Diagnosis not present

## 2022-02-15 DIAGNOSIS — Z955 Presence of coronary angioplasty implant and graft: Secondary | ICD-10-CM | POA: Diagnosis not present

## 2022-02-15 DIAGNOSIS — Z951 Presence of aortocoronary bypass graft: Secondary | ICD-10-CM | POA: Diagnosis not present

## 2022-02-15 DIAGNOSIS — I4891 Unspecified atrial fibrillation: Secondary | ICD-10-CM | POA: Diagnosis not present

## 2022-02-15 DIAGNOSIS — E119 Type 2 diabetes mellitus without complications: Secondary | ICD-10-CM | POA: Diagnosis not present

## 2022-02-15 DIAGNOSIS — I10 Essential (primary) hypertension: Secondary | ICD-10-CM | POA: Diagnosis not present

## 2022-02-15 DIAGNOSIS — E785 Hyperlipidemia, unspecified: Secondary | ICD-10-CM | POA: Diagnosis not present

## 2022-02-18 DIAGNOSIS — I10 Essential (primary) hypertension: Secondary | ICD-10-CM | POA: Diagnosis not present

## 2022-02-18 DIAGNOSIS — E119 Type 2 diabetes mellitus without complications: Secondary | ICD-10-CM | POA: Diagnosis not present

## 2022-02-18 DIAGNOSIS — Z955 Presence of coronary angioplasty implant and graft: Secondary | ICD-10-CM | POA: Diagnosis not present

## 2022-02-18 DIAGNOSIS — E785 Hyperlipidemia, unspecified: Secondary | ICD-10-CM | POA: Diagnosis not present

## 2022-02-18 DIAGNOSIS — Z951 Presence of aortocoronary bypass graft: Secondary | ICD-10-CM | POA: Diagnosis not present

## 2022-02-18 DIAGNOSIS — I4891 Unspecified atrial fibrillation: Secondary | ICD-10-CM | POA: Diagnosis not present

## 2022-02-19 ENCOUNTER — Other Ambulatory Visit: Payer: Self-pay | Admitting: Cardiology

## 2022-02-20 DIAGNOSIS — I4891 Unspecified atrial fibrillation: Secondary | ICD-10-CM | POA: Diagnosis not present

## 2022-02-20 DIAGNOSIS — Z951 Presence of aortocoronary bypass graft: Secondary | ICD-10-CM | POA: Diagnosis not present

## 2022-02-20 DIAGNOSIS — I10 Essential (primary) hypertension: Secondary | ICD-10-CM | POA: Diagnosis not present

## 2022-02-20 DIAGNOSIS — Z955 Presence of coronary angioplasty implant and graft: Secondary | ICD-10-CM | POA: Diagnosis not present

## 2022-02-20 DIAGNOSIS — E119 Type 2 diabetes mellitus without complications: Secondary | ICD-10-CM | POA: Diagnosis not present

## 2022-02-20 DIAGNOSIS — E785 Hyperlipidemia, unspecified: Secondary | ICD-10-CM | POA: Diagnosis not present

## 2022-02-20 NOTE — Telephone Encounter (Signed)
Prescription refill request for Eliquis received. Indication: AF Last office visit: 01/14/22  R Revankar MD Scr: 1.07 on 12/05/21 Age: 87 Weight: 94.6kg  Based on above findings Eliquis '5mg'$  twice daily is the appropriate dose.  Refill approved.

## 2022-02-22 DIAGNOSIS — Z951 Presence of aortocoronary bypass graft: Secondary | ICD-10-CM | POA: Diagnosis not present

## 2022-02-22 DIAGNOSIS — I10 Essential (primary) hypertension: Secondary | ICD-10-CM | POA: Diagnosis not present

## 2022-02-22 DIAGNOSIS — E119 Type 2 diabetes mellitus without complications: Secondary | ICD-10-CM | POA: Diagnosis not present

## 2022-02-22 DIAGNOSIS — I4891 Unspecified atrial fibrillation: Secondary | ICD-10-CM | POA: Diagnosis not present

## 2022-02-22 DIAGNOSIS — E785 Hyperlipidemia, unspecified: Secondary | ICD-10-CM | POA: Diagnosis not present

## 2022-02-22 DIAGNOSIS — Z955 Presence of coronary angioplasty implant and graft: Secondary | ICD-10-CM | POA: Diagnosis not present

## 2022-02-25 DIAGNOSIS — E785 Hyperlipidemia, unspecified: Secondary | ICD-10-CM | POA: Diagnosis not present

## 2022-02-25 DIAGNOSIS — E119 Type 2 diabetes mellitus without complications: Secondary | ICD-10-CM | POA: Diagnosis not present

## 2022-02-25 DIAGNOSIS — I4891 Unspecified atrial fibrillation: Secondary | ICD-10-CM | POA: Diagnosis not present

## 2022-02-25 DIAGNOSIS — I10 Essential (primary) hypertension: Secondary | ICD-10-CM | POA: Diagnosis not present

## 2022-02-25 DIAGNOSIS — Z955 Presence of coronary angioplasty implant and graft: Secondary | ICD-10-CM | POA: Diagnosis not present

## 2022-02-25 DIAGNOSIS — Z951 Presence of aortocoronary bypass graft: Secondary | ICD-10-CM | POA: Diagnosis not present

## 2022-02-27 DIAGNOSIS — I10 Essential (primary) hypertension: Secondary | ICD-10-CM | POA: Diagnosis not present

## 2022-02-27 DIAGNOSIS — E785 Hyperlipidemia, unspecified: Secondary | ICD-10-CM | POA: Diagnosis not present

## 2022-02-27 DIAGNOSIS — Z951 Presence of aortocoronary bypass graft: Secondary | ICD-10-CM | POA: Diagnosis not present

## 2022-02-27 DIAGNOSIS — I4891 Unspecified atrial fibrillation: Secondary | ICD-10-CM | POA: Diagnosis not present

## 2022-02-27 DIAGNOSIS — Z955 Presence of coronary angioplasty implant and graft: Secondary | ICD-10-CM | POA: Diagnosis not present

## 2022-02-27 DIAGNOSIS — E119 Type 2 diabetes mellitus without complications: Secondary | ICD-10-CM | POA: Diagnosis not present

## 2022-03-05 DIAGNOSIS — E119 Type 2 diabetes mellitus without complications: Secondary | ICD-10-CM | POA: Diagnosis not present

## 2022-03-05 DIAGNOSIS — I10 Essential (primary) hypertension: Secondary | ICD-10-CM | POA: Diagnosis not present

## 2022-03-05 DIAGNOSIS — I4891 Unspecified atrial fibrillation: Secondary | ICD-10-CM | POA: Diagnosis not present

## 2022-03-05 DIAGNOSIS — E785 Hyperlipidemia, unspecified: Secondary | ICD-10-CM | POA: Diagnosis not present

## 2022-03-05 DIAGNOSIS — Z955 Presence of coronary angioplasty implant and graft: Secondary | ICD-10-CM | POA: Diagnosis not present

## 2022-03-05 DIAGNOSIS — Z951 Presence of aortocoronary bypass graft: Secondary | ICD-10-CM | POA: Diagnosis not present

## 2022-03-06 DIAGNOSIS — E785 Hyperlipidemia, unspecified: Secondary | ICD-10-CM | POA: Diagnosis not present

## 2022-03-06 DIAGNOSIS — I4891 Unspecified atrial fibrillation: Secondary | ICD-10-CM | POA: Diagnosis not present

## 2022-03-06 DIAGNOSIS — Z955 Presence of coronary angioplasty implant and graft: Secondary | ICD-10-CM | POA: Diagnosis not present

## 2022-03-06 DIAGNOSIS — I10 Essential (primary) hypertension: Secondary | ICD-10-CM | POA: Diagnosis not present

## 2022-03-06 DIAGNOSIS — Z951 Presence of aortocoronary bypass graft: Secondary | ICD-10-CM | POA: Diagnosis not present

## 2022-03-06 DIAGNOSIS — E119 Type 2 diabetes mellitus without complications: Secondary | ICD-10-CM | POA: Diagnosis not present

## 2022-03-08 DIAGNOSIS — I4891 Unspecified atrial fibrillation: Secondary | ICD-10-CM | POA: Diagnosis not present

## 2022-03-08 DIAGNOSIS — Z955 Presence of coronary angioplasty implant and graft: Secondary | ICD-10-CM | POA: Diagnosis not present

## 2022-03-08 DIAGNOSIS — E119 Type 2 diabetes mellitus without complications: Secondary | ICD-10-CM | POA: Diagnosis not present

## 2022-03-08 DIAGNOSIS — I10 Essential (primary) hypertension: Secondary | ICD-10-CM | POA: Diagnosis not present

## 2022-03-08 DIAGNOSIS — E785 Hyperlipidemia, unspecified: Secondary | ICD-10-CM | POA: Diagnosis not present

## 2022-03-08 DIAGNOSIS — Z951 Presence of aortocoronary bypass graft: Secondary | ICD-10-CM | POA: Diagnosis not present

## 2022-03-11 DIAGNOSIS — I4891 Unspecified atrial fibrillation: Secondary | ICD-10-CM | POA: Diagnosis not present

## 2022-03-11 DIAGNOSIS — I10 Essential (primary) hypertension: Secondary | ICD-10-CM | POA: Diagnosis not present

## 2022-03-11 DIAGNOSIS — Z955 Presence of coronary angioplasty implant and graft: Secondary | ICD-10-CM | POA: Diagnosis not present

## 2022-03-11 DIAGNOSIS — E119 Type 2 diabetes mellitus without complications: Secondary | ICD-10-CM | POA: Diagnosis not present

## 2022-03-11 DIAGNOSIS — E785 Hyperlipidemia, unspecified: Secondary | ICD-10-CM | POA: Diagnosis not present

## 2022-03-11 DIAGNOSIS — Z951 Presence of aortocoronary bypass graft: Secondary | ICD-10-CM | POA: Diagnosis not present

## 2022-03-15 DIAGNOSIS — I10 Essential (primary) hypertension: Secondary | ICD-10-CM | POA: Diagnosis not present

## 2022-03-15 DIAGNOSIS — I4891 Unspecified atrial fibrillation: Secondary | ICD-10-CM | POA: Diagnosis not present

## 2022-03-15 DIAGNOSIS — Z955 Presence of coronary angioplasty implant and graft: Secondary | ICD-10-CM | POA: Diagnosis not present

## 2022-03-15 DIAGNOSIS — Z951 Presence of aortocoronary bypass graft: Secondary | ICD-10-CM | POA: Diagnosis not present

## 2022-03-15 DIAGNOSIS — E785 Hyperlipidemia, unspecified: Secondary | ICD-10-CM | POA: Diagnosis not present

## 2022-03-15 DIAGNOSIS — E119 Type 2 diabetes mellitus without complications: Secondary | ICD-10-CM | POA: Diagnosis not present

## 2022-03-18 DIAGNOSIS — Z955 Presence of coronary angioplasty implant and graft: Secondary | ICD-10-CM | POA: Diagnosis not present

## 2022-03-18 DIAGNOSIS — I10 Essential (primary) hypertension: Secondary | ICD-10-CM | POA: Diagnosis not present

## 2022-03-18 DIAGNOSIS — E119 Type 2 diabetes mellitus without complications: Secondary | ICD-10-CM | POA: Diagnosis not present

## 2022-03-18 DIAGNOSIS — Z951 Presence of aortocoronary bypass graft: Secondary | ICD-10-CM | POA: Diagnosis not present

## 2022-03-18 DIAGNOSIS — I4891 Unspecified atrial fibrillation: Secondary | ICD-10-CM | POA: Diagnosis not present

## 2022-03-18 DIAGNOSIS — E785 Hyperlipidemia, unspecified: Secondary | ICD-10-CM | POA: Diagnosis not present

## 2022-03-20 DIAGNOSIS — Z951 Presence of aortocoronary bypass graft: Secondary | ICD-10-CM | POA: Diagnosis not present

## 2022-03-20 DIAGNOSIS — Z955 Presence of coronary angioplasty implant and graft: Secondary | ICD-10-CM | POA: Diagnosis not present

## 2022-03-20 DIAGNOSIS — I4891 Unspecified atrial fibrillation: Secondary | ICD-10-CM | POA: Diagnosis not present

## 2022-03-20 DIAGNOSIS — E785 Hyperlipidemia, unspecified: Secondary | ICD-10-CM | POA: Diagnosis not present

## 2022-03-20 DIAGNOSIS — I10 Essential (primary) hypertension: Secondary | ICD-10-CM | POA: Diagnosis not present

## 2022-03-20 DIAGNOSIS — E119 Type 2 diabetes mellitus without complications: Secondary | ICD-10-CM | POA: Diagnosis not present

## 2022-03-22 DIAGNOSIS — Z955 Presence of coronary angioplasty implant and graft: Secondary | ICD-10-CM | POA: Diagnosis not present

## 2022-03-22 DIAGNOSIS — E119 Type 2 diabetes mellitus without complications: Secondary | ICD-10-CM | POA: Diagnosis not present

## 2022-03-22 DIAGNOSIS — I10 Essential (primary) hypertension: Secondary | ICD-10-CM | POA: Diagnosis not present

## 2022-03-22 DIAGNOSIS — E785 Hyperlipidemia, unspecified: Secondary | ICD-10-CM | POA: Diagnosis not present

## 2022-03-22 DIAGNOSIS — I4891 Unspecified atrial fibrillation: Secondary | ICD-10-CM | POA: Diagnosis not present

## 2022-03-22 DIAGNOSIS — Z951 Presence of aortocoronary bypass graft: Secondary | ICD-10-CM | POA: Diagnosis not present

## 2022-03-26 ENCOUNTER — Ambulatory Visit (INDEPENDENT_AMBULATORY_CARE_PROVIDER_SITE_OTHER): Payer: Medicare Other | Admitting: Adult Health

## 2022-03-26 ENCOUNTER — Encounter: Payer: Self-pay | Admitting: Adult Health

## 2022-03-26 VITALS — BP 154/80 | HR 65 | Temp 98.0°F | Ht 69.0 in | Wt 211.2 lb

## 2022-03-26 DIAGNOSIS — G4733 Obstructive sleep apnea (adult) (pediatric): Secondary | ICD-10-CM | POA: Diagnosis not present

## 2022-03-26 DIAGNOSIS — I25110 Atherosclerotic heart disease of native coronary artery with unstable angina pectoris: Secondary | ICD-10-CM | POA: Diagnosis not present

## 2022-03-26 DIAGNOSIS — E669 Obesity, unspecified: Secondary | ICD-10-CM

## 2022-03-26 DIAGNOSIS — R0683 Snoring: Secondary | ICD-10-CM | POA: Insufficient documentation

## 2022-03-26 HISTORY — DX: Snoring: R06.83

## 2022-03-26 NOTE — Progress Notes (Signed)
Reviewed and agree with assessment/plan.   Chesley Mires, MD [facility] 03/26/2022, 5:20 PM Pager:  [phone]

## 2022-03-26 NOTE — Progress Notes (Signed)
$'@Patient'h$  ID: Todd Sampson, male    DOB: 10-22-1934, 87 y.o.   MRN: [id number]  Chief Complaint  Patient presents with   Consult    Referring provider: Townsend Roger, MD  HPI: 87 year old male seen for sleep consult March 26, 2022 for snoring, daytime sleepiness, restless sleep and low oxygen levels nocturnally.  TEST/EVENTS :   03/26/2022 Sleep consult  Patient presents for a sleep consult.  Patient has an extensive cardiac history with coronary artery disease and atrial fibrillation.  Patient was referred to our office to see if he had underlying sleep apnea.  Patient was admitted to the hospital in June and was noted to have some nocturnal hypoxemia.  He has been told by family members that he snores.  He has daytime sleepiness especially after he eats lunch.  Typically goes to bed about 10 PM.  Goes to sleep within 30 minutes.  Is up only once at night.  Up usually about 6 AM.  Is never had a sleep study before.  He drinks 2 cups of coffee daily.  He does not use any sleep aids.  He does not take any naps.  Epworth is 5 out of 24.  Typically gets sleepy in the evening hours after eating lunch.  No symptoms suspicious for cataplexy or sleep paralysis.  No history of congestive heart failure or stroke  SH : Patient is married.  Lives with his wife.  He is a caregiver for his wife who has dementia.  He is a former smoker.  No alcohol.  He is a retired Holiday representative.  Has 2 grown adult daughters.  He is able to drive.  And stays fairly active at home.  FH : Cancer -Leukemia , Lung cancer, esophageal cancer.   Past Surgical History:  Procedure Laterality Date   APPENDECTOMY     CARDIAC BYPASS     CORONARY STENT INTERVENTION N/A 12/04/2021   Procedure: CORONARY STENT INTERVENTION;  Surgeon: Jettie Booze, MD;  Location: [place] [facility];  Service: Cardiovascular;  Laterality: N/A;   LEFT HEART CATH AND CORS/GRAFTS ANGIOGRAPHY N/A 12/04/2021   Procedure: LEFT HEART CATH AND CORS/GRAFTS  ANGIOGRAPHY;  Surgeon: Jettie Booze, MD;  Location: [place] [facility];  Service: Cardiovascular;  Laterality: N/A;   REPLACEMENT TOTAL KNEE BILATERAL          Allergies  Allergen Reactions   Penicillins Other (See Comments)    Childhood reaction--pt does not remember    Immunization History  Administered Date(s) Administered   Zoster Recombinat (Shingrix) 05/20/2019    Past Medical History:  Diagnosis Date   Anemia    Barrett's esophagus    BMI 32.0-32.9,adult 11/09/2019   BPH (benign prostatic hyperplasia) 10/17/2021   Calculus of gallbladder with chronic cholecystitis without obstruction 11/09/2019   Coronary artery disease involving native coronary artery of native heart with unstable angina pectoris ([place]) 12/04/2021   Coronary artery disease of native artery of native heart with stable angina pectoris ([place]) 06/06/2016   Diet-controlled diabetes mellitus ([place]) 06/06/2016   Dyslipidemia (high LDL; low HDL) 06/06/2016   Essential hypertension 06/06/2016   GERD (gastroesophageal reflux disease) 10/17/2021   Guillain-Barre disease ([place])    H/O: hemorrhoidectomy    Heart disease    Hx of CABG 10/02/2017   Hypercholesteremia 10/17/2021   Hyperlipidemia    Hypertension    Mitral regurgitation 07/26/2019   Non-intractable vomiting with nausea 11/09/2019   Obesity (BMI 30.0-34.9) 12/13/2021   Permanent atrial fibrillation ([place])  06/06/2016   Postoperative examination 12/13/2019   Preoperative cardiovascular examination 10/02/2017   Primary osteoarthritis of left hip 04/20/2018   Added automatically from request for surgery [id number]  Formatting of this note might be different from the original. Added automatically from request for surgery [id number]   Prostate cancer [facility])    Salivary gland calculi    Special screening examination for viral disease 11/09/2019   Toe pain, bilateral 09/10/2019   Type 2 diabetes, diet controlled ([place]) 3/38/2505   Umbilical hernia without  obstruction and without gangrene 11/09/2019   Unilateral primary osteoarthritis, right hip 04/30/2021    Tobacco History: Social History   Tobacco Use  Smoking Status Former  Smokeless Tobacco Never   Counseling given: Not Answered   Outpatient Medications Prior to Visit  Medication Sig Dispense Refill   acetaminophen (TYLENOL) 500 MG tablet Take 500-1,000 mg by mouth every 6 (six) hours as needed for pain.     amLODipine (NORVASC) 5 MG tablet Take 1 tablet (5 mg total) by mouth daily. 90 tablet 3   clopidogrel (PLAVIX) 75 MG tablet Take 1 tablet (75 mg total) by mouth daily with breakfast. 90 tablet 6   ELIQUIS 5 MG TABS tablet TAKE ONE TABLET BY MOUTH TWICE DAILY 180 tablet 1   finasteride (PROSCAR) 5 MG tablet Take 5 mg by mouth daily.      folic acid (FOLVITE) 1 MG tablet Take 1 mg by mouth daily.     hydrALAZINE (APRESOLINE) 25 MG tablet Take 25 mg by mouth 2 (two) times daily.     hydrochlorothiazide (HYDRODIURIL) 25 MG tablet Take 25 mg by mouth daily.     isosorbide mononitrate (IMDUR) 120 MG 24 hr tablet Take 1 tablet (120 mg total) by mouth daily. 90 tablet 2   lisinopril (ZESTRIL) 40 MG tablet Take 40 mg by mouth daily.     nitroGLYCERIN (NITROSTAT) 0.4 MG SL tablet Place 1 tablet (0.4 mg total) under the tongue every 5 (five) minutes as needed for chest pain. 25 tablet 3   Omega-3 Fatty Acids (FISH OIL) 1000 MG CAPS Take 1,000 mg by mouth daily.     pantoprazole (PROTONIX) 40 MG tablet Take 40 mg by mouth daily.     rosuvastatin (CRESTOR) 20 MG tablet Take 1 tablet (20 mg total) by mouth daily. 30 tablet 6   thiamine 100 MG tablet Take 100 mg by mouth daily.     furosemide (LASIX) 40 MG tablet Take 1 tablet (40 mg total) by mouth daily as needed for fluid. 10 tablet 0   No facility-administered medications prior to visit.     Review of Systems:   Constitutional:   No  weight loss, night sweats,  Fevers, chills,  +fatigue, or  lassitude.  HEENT:   No headaches,   Difficulty swallowing,  Tooth/dental problems, or  Sore throat,                No sneezing, itching, ear ache, nasal congestion, post nasal drip,   CV:  No chest pain,  Orthopnea, PND,  anasarca, dizziness, palpitations, syncope.   GI  No heartburn, indigestion, abdominal pain, nausea, vomiting, diarrhea, change in bowel habits, loss of appetite, bloody stools.   Resp: No shortness of breath with exertion or at rest.  No excess mucus, no productive cough,  No non-productive cough,  No coughing up of blood.  No change in color of mucus.  No wheezing.  No chest wall deformity  Skin: no rash or lesions.  GU: no dysuria, change in color of urine, no urgency or frequency.  No flank pain, no hematuria   MS:  No joint pain or swelling.  No decreased range of motion.  No back pain.    Physical Exam  BP (!) 154/80 (BP Location: Left Arm, Patient Position: Sitting, Cuff Size: Normal)   Pulse 65   Temp 98 F (36.7 C) (Oral)   Ht '5\' 9"'$  (1.753 m)   Wt 211 lb 3.2 oz (95.8 kg)   SpO2 97%   BMI 31.19 kg/m   GEN: A/Ox3; pleasant , NAD, well nourished    HEENT:  [place]/AT,   NOSE-clear, THROAT-clear, no lesions, no postnasal drip or exudate noted.  Class III MP airway  NECK:  Supple w/ fair ROM; no JVD; normal carotid impulses w/o bruits; no thyromegaly or nodules palpated; no lymphadenopathy.    RESP  Clear  P & A; w/o, wheezes/ rales/ or rhonchi. no accessory muscle use, no dullness to percussion  CARD:  RRR, no m/r/g, no peripheral edema, pulses intact, no cyanosis or clubbing.  GI:   Soft & nt; nml bowel sounds; no organomegaly or masses detected.   Musco: Warm bil, no deformities or joint swelling noted.   Neuro: alert, no focal deficits noted.    Skin: Warm, no lesions or rashes    Lab Results:  CBC   BNP   ProBNP No results found for: "PROBNP"  Imaging: No results found.        No data to display          No results found for:  "NITRICOXIDE"      Assessment & Plan:   Snoring Snoring, daytime sleepiness, BMI 31, reported nocturnal hypoxemia all concerning for underlying sleep apnea.  We will set patient up for home sleep study.  Patient education given on sleep apnea.  - discussed how weight can impact sleep and risk for sleep disordered breathing - discussed options to assist with weight loss: combination of diet modification, cardiovascular and strength training exercises   - had an extensive discussion regarding the adverse health consequences related to untreated sleep disordered breathing - specifically discussed the risks for hypertension, coronary artery disease, cardiac dysrhythmias, cerebrovascular disease, and diabetes - lifestyle modification discussed   - discussed how sleep disruption can increase risk of accidents, particularly when driving - safe driving practices were discussed   Plan  Patient Instructions  Set up for Home sleep study  Work on healthy weight loss  Do not drive if sleepy  Follow up in 2 months to discuss test results and treatment plan.      Obesity (BMI 30.0-34.9) Healthy weight loss discussed     Rexene Edison, NP 03/26/2022

## 2022-03-26 NOTE — Assessment & Plan Note (Signed)
Snoring, daytime sleepiness, BMI 31, reported nocturnal hypoxemia all concerning for underlying sleep apnea.  We will set patient up for home sleep study.  Patient education given on sleep apnea.  - discussed how weight can impact sleep and risk for sleep disordered breathing - discussed options to assist with weight loss: combination of diet modification, cardiovascular and strength training exercises   - had an extensive discussion regarding the adverse health consequences related to untreated sleep disordered breathing - specifically discussed the risks for hypertension, coronary artery disease, cardiac dysrhythmias, cerebrovascular disease, and diabetes - lifestyle modification discussed   - discussed how sleep disruption can increase risk of accidents, particularly when driving - safe driving practices were discussed   Plan  Patient Instructions  Set up for Home sleep study  Work on healthy weight loss  Do not drive if sleepy  Follow up in 2 months to discuss test results and treatment plan.

## 2022-03-26 NOTE — Assessment & Plan Note (Signed)
Healthy weight loss discussed 

## 2022-03-26 NOTE — Patient Instructions (Signed)
Set up for Home sleep study  Work on healthy weight loss  Do not drive if sleepy  Follow up in 2 months to discuss test results and treatment plan.

## 2022-04-02 ENCOUNTER — Other Ambulatory Visit: Payer: Self-pay

## 2022-04-02 MED ORDER — ISOSORBIDE MONONITRATE ER 120 MG PO TB24: 120.0000 mg | ORAL_TABLET | Freq: Every day | ORAL | 2 refills | Status: DC

## 2022-04-30 DIAGNOSIS — Z471 Aftercare following joint replacement surgery: Secondary | ICD-10-CM | POA: Diagnosis not present

## 2022-04-30 DIAGNOSIS — Z96643 Presence of artificial hip joint, bilateral: Secondary | ICD-10-CM | POA: Diagnosis not present

## 2022-04-30 DIAGNOSIS — Z96653 Presence of artificial knee joint, bilateral: Secondary | ICD-10-CM | POA: Diagnosis not present

## 2022-05-08 ENCOUNTER — Ambulatory Visit (INDEPENDENT_AMBULATORY_CARE_PROVIDER_SITE_OTHER): Payer: Medicare Other | Admitting: Internal Medicine

## 2022-05-08 ENCOUNTER — Encounter: Payer: Self-pay | Admitting: Internal Medicine

## 2022-05-08 VITALS — BP 132/68 | HR 56 | Temp 97.9°F | Resp 16 | Ht 70.0 in | Wt 207.0 lb

## 2022-05-08 DIAGNOSIS — Z23 Encounter for immunization: Secondary | ICD-10-CM | POA: Diagnosis not present

## 2022-05-08 DIAGNOSIS — R809 Proteinuria, unspecified: Secondary | ICD-10-CM | POA: Diagnosis not present

## 2022-05-08 DIAGNOSIS — E1129 Type 2 diabetes mellitus with other diabetic kidney complication: Secondary | ICD-10-CM | POA: Diagnosis not present

## 2022-05-08 DIAGNOSIS — I10 Essential (primary) hypertension: Secondary | ICD-10-CM

## 2022-05-08 DIAGNOSIS — Z0000 Encounter for general adult medical examination without abnormal findings: Secondary | ICD-10-CM

## 2022-05-08 DIAGNOSIS — I25118 Atherosclerotic heart disease of native coronary artery with other forms of angina pectoris: Secondary | ICD-10-CM | POA: Diagnosis not present

## 2022-05-08 DIAGNOSIS — E7800 Pure hypercholesterolemia, unspecified: Secondary | ICD-10-CM

## 2022-05-08 HISTORY — DX: Proteinuria, unspecified: R80.9

## 2022-05-08 HISTORY — DX: Proteinuria, unspecified: E11.29

## 2022-05-08 LAB — HEMOGLOBIN A1C
Est. average glucose Bld gHb Est-mCnc: 140 mg/dL
Hgb A1c MFr Bld: 6.5 % — ABNORMAL HIGH (ref 4.8–5.6)

## 2022-05-08 MED ORDER — EMPAGLIFLOZIN 10 MG PO TABS: 10.0000 mg | ORAL_TABLET | Freq: Every day | ORAL | 3 refills | Status: DC

## 2022-05-08 NOTE — Assessment & Plan Note (Addendum)
The patient has CAD without any signs/symptoms of angina.  I have reviewed his medications.  We will continue with risk factor modifcation.  He cannot be on a beta blocker due to recent history of bradycardia.  He remains on a statin and he is rate controlled with his A. Fib where he is on eliquis for anticoagulation.  I am going to start him on jardiance for his heart as well as his urine microalbuminuria.

## 2022-05-08 NOTE — Assessment & Plan Note (Signed)
We checked his FLP in 01/2022 and his LDL was at goal with a value of 43.  We will continue on his statin.  He has completed cardiac rehab but I still want him to continue with lifestyle modification and regular exercise.

## 2022-05-08 NOTE — Assessment & Plan Note (Signed)
His BP is doing well today.  He denies any further dizziness spells.  His multiple medications were reconciled.

## 2022-05-08 NOTE — Progress Notes (Signed)
Office Visit  Subjective   Patient ID: Todd Sampson   DOB: January 16, 1935   Age: 87 y.o.   MRN: [id number]   Chief Complaint Chief Complaint  Patient presents with   Follow-up     History of Present Illness    The patient is a 87 yo male who returns for followup of his CAD.  He was hospitalized in 12/06/2021 due to chest pain at that time.  He has a history of severe CAD with history of CABG x3 in 2000 where he presented to the ER with chest pain.  They performed an ECHO on him on 12/05/2021 and this showed a LVEF of 55-60% without wall motion abnormalities but he had mild LVH and they could not assess his diastolic function.  He had mildly reduce RV systolic function and he had bilateral atrial moderate enlargement.  There was borderline dilatation of the ascending aorta with a measurement of 3.6cm. The patient was transferred to [facility] where he underwent a LHC that showed lesions as described:  mid RCA 50%, RV branch 75%, proximal RCA 40%, mid LAD 50%, 2nd diagonal 75%, distal LAD 70%, mid CFx 100%, and SVG to OM with distal graft lesion with 95%.  They did place a DES in his distal SVG to OM.  He had some complications of bradycardia and hypoxia when he did sleep at night where they kept him for an extra day and want him sent for a sleep study as an outpatient.  They patient was discharged home with plans for Plavix for 6 months and continued Eliquis for life.  They did change his lovastatin to crestor.  Over the interim, he has finished his cardiac rehab.  He did see Dr. Lennox Pippins in followup on 01/14/2022 and he felt he was stable.  He sees cardiology every 6 months.  He did go for a right total hip this past year where he had a nuclear stress stress on 03/28/2021 that was low risk and showed no evidence of ischemia with a LVEF 57%.  In 05/2016, he was having some chest tightness.  They sent him for a cardiolite stress test and the patient tells me that this was normal. He did have a CABG done in 2000  as described. They did perform a nuclear stress test on him in 10/2012 and this showed no evidence of ischemia with a normal EF 67%.  They repeated a treadmill sestamibi nuclear stress in 05/2015 and this showed a normal LVEF of 59% and was a negative test.  He underwent a myocardial perfusion test in 09/2017 and this showed an EF 48% with post CABD septal wall hypokinesis.  They felt this study was normal and he had no evidence of ischemia.  He also underwent an ECHO in 09/2017 which showed his EF 55% with RA and LA dilatation and moderate TR.  His CAD is controlled with therapy as summarized in the medication list and previous notes. The patient has the following comorbid condition(s): atrial fibrillation. He has no baseline symptoms of CAD. He has the following modifiable risk factor(s): HTN, DM, and hyperlipidemia.  He denies any chest pain, palpitations, orthopnea, exertional dyspnea, and syncope or recurrent peripheral edema.  The patient is a 87 year old Caucasian/White male who returns for a follow-up visit for his T2 diabetes. Since the last visit, there have been no problems. He remains on no medications; the diabetes is being managed by dietary intervention alone. He is not walking as much as  they would like. He specifically denies unexplained abdominal pain, nausea or vomiting and documented hypoglycemia. He does not routinely check blood sugars. He came in fasting today in anticipation of lab work.  His last HgBA1c was done 3 months ago ago and was 6.4%. He has no long term complications of diabetes- no diabetic retinopathy, neuropathy or nephropathy. However on checking his urine, he was noted to have some urinary macroalbuminuria. He remains on an ACE inhibitor.  We noted he had some macroalbuminuria on his yearly exam in 01/2022 where I wanted him to start on farxiga.  The patient did not start this as it was too expensive for him.  The patient is a 87 year old Caucasian/White male who presents for a  follow-up evaluation of hypertension.  This past year, his BP was borderline where cardiology adjusted his BP meds. This past year, his BP was elevated and I started him on bisoprolol.  He however stopped this medication due to his HR going down to the 40's and he was having fatigue.  He only took it for 1 week.   The patient has not been checking his blood pressure at home. The patient's current medications include: amlodipine 5 mg daily, hydralazine '25mg'$  BID, Imdur 120 Mg daily, HCTZ '25mg'$  daily and lisinopril 40 mg daily. The patient has been tolerating his medications well. The patient denies any headache, visual changes, dizziness, lightheadness, chest pain, shortness of breath, weakness/numbness, and edema.  He reports there have been no other symptoms noted.  He is still using compression hose for his history of venous insufficiency.  Todd Sampson returns today for routine followup on his cholesterol. Overall, he states he is doing well and is without any complaints or problems at this time. He specifically denies abdominal pain, nausea, vomiting, diarrhea, myalgias, and fatigue. He remains on dietary management which he is not very compliant with but he is also on the following cholesterol lowering medications: Omega 3 fish oil and crestor 20 mg qhs. He is fasting in anticipation for labs today.   Also on his last visit, he was referred for an outpatient evaluation for sleep apnea.  They noted when he was in the hospital in 11/2021 that he had some complications of bradycardia and hypoxia when he did sleep at night where they kept him for an extra day and want him sent for a sleep study as an outpatient.  He was seen by pulmonary in 03/2022 and they are setting him up for what sounds like a home overnight pulse oximetry.    Past Medical History Past Medical History:  Diagnosis Date   Anemia    Barrett's esophagus    BMI 32.0-32.9,adult 11/09/2019   BPH (benign prostatic hyperplasia) 10/17/2021    Calculus of gallbladder with chronic cholecystitis without obstruction 11/09/2019   Coronary artery disease involving native coronary artery of native heart with unstable angina pectoris ([place]) 12/04/2021   Coronary artery disease of native artery of native heart with stable angina pectoris ([place]) 06/06/2016   Diet-controlled diabetes mellitus ([place]) 06/06/2016   Dyslipidemia (high LDL; low HDL) 06/06/2016   Essential hypertension 06/06/2016   GERD (gastroesophageal reflux disease) 10/17/2021   Guillain-Barre disease ([place])    H/O: hemorrhoidectomy    Heart disease    Hx of CABG 10/02/2017   Hypercholesteremia 10/17/2021   Hyperlipidemia    Hypertension    Mitral regurgitation 07/26/2019   Non-intractable vomiting with nausea 11/09/2019   Obesity (BMI 30.0-34.9) 12/13/2021   Permanent atrial fibrillation (  [place]) 06/06/2016   Postoperative examination 12/13/2019   Preoperative cardiovascular examination 10/02/2017   Primary osteoarthritis of left hip 04/20/2018   Added automatically from request for surgery [id number]  Formatting of this note might be different from the original. Added automatically from request for surgery [id number]   Prostate cancer [facility])    Salivary gland calculi    Special screening examination for viral disease 11/09/2019   Toe pain, bilateral 09/10/2019   Type 2 diabetes, diet controlled ([place]) 09/30/270   Umbilical hernia without obstruction and without gangrene 11/09/2019   Unilateral primary osteoarthritis, right hip 04/30/2021     Allergies Allergies  Allergen Reactions   Penicillins Other (See Comments)    Childhood reaction--pt does not remember     Review of Systems Review of Systems  Constitutional:  Negative for chills, fever and weight loss.  Eyes:  Negative for blurred vision and double vision.  Respiratory:  Negative for cough, shortness of breath and wheezing.   Cardiovascular:  Negative for chest pain, palpitations, orthopnea and leg swelling.   Gastrointestinal:  Negative for abdominal pain, blood in stool, constipation, diarrhea, melena, nausea and vomiting.  Genitourinary:  Negative for hematuria.  Musculoskeletal:  Negative for myalgias.  Skin:  Negative for rash.  Neurological:  Negative for dizziness, weakness and headaches.  Psychiatric/Behavioral:  The patient does not have insomnia.        Objective:    Vitals Orthostatic Vitals for the past 48 hrs (Last 6 readings):  Patient Position  05/08/22 1349 Sitting     Physical Examination Physical Exam Constitutional:      General: He is not in acute distress.    Appearance: Normal appearance. He is not ill-appearing.  Cardiovascular:     Rate and Rhythm: Normal rate and regular rhythm.     Pulses: Normal pulses.     Heart sounds: Normal heart sounds. No murmur heard.    No friction rub. No gallop.  Pulmonary:     Effort: Pulmonary effort is normal.     Breath sounds: Normal breath sounds. No wheezing, rhonchi or rales.  Abdominal:     General: Abdomen is flat. Bowel sounds are normal. There is no distension.     Palpations: Abdomen is soft.     Tenderness: There is no abdominal tenderness.  Musculoskeletal:        General: No swelling.     Right lower leg: No edema.     Left lower leg: No edema.  Skin:    General: Skin is warm and dry.     Findings: No rash.  Neurological:     Mental Status: He is alert.     Comments: CN II-XII grossly intact  Psychiatric:        Mood and Affect: Mood normal.        Behavior: Behavior normal.        Thought Content: Thought content normal.        Assessment & Plan:   Coronary artery disease of native artery of native heart with stable angina pectoris [facility]) The patient has CAD without any signs/symptoms of angina.  I have reviewed his medications.  We will continue with risk factor modifcation.  He cannot be on a beta blocker due to recent history of bradycardia.  He remains on a statin and he is rate controlled  with his A. Fib where he is on eliquis for anticoagulation.  I am going to start him on jardiance for his heart as well as his  urine microalbuminuria.  Microalbuminuria due to type 2 diabetes mellitus ([place]) The patient has diet controlled diabetes.  I wanted to start him on farxiga as he was having some protein in his urine which was checked in 01/2022.  We will start him on jardiance.  Essential hypertension His BP is doing well today.  He denies any further dizziness spells.  His multiple medications were reconciled.  Hypercholesteremia We checked his FLP in 01/2022 and his LDL was at goal with a value of 43.  We will continue on his statin.  He has completed cardiac rehab but I still want him to continue with lifestyle modification and regular exercise.    No follow-ups on file.   Todd Roger, MD

## 2022-05-08 NOTE — Assessment & Plan Note (Signed)
The patient has diet controlled diabetes.  I wanted to start him on farxiga as he was having some protein in his urine which was checked in 01/2022.  We will start him on jardiance.

## 2022-05-17 ENCOUNTER — Other Ambulatory Visit: Payer: Self-pay

## 2022-05-17 MED ORDER — PANTOPRAZOLE SODIUM 40 MG PO TBEC: 40.0000 mg | DELAYED_RELEASE_TABLET | Freq: Every day | ORAL | 1 refills | Status: DC

## 2022-05-27 DIAGNOSIS — H029 Unspecified disorder of eyelid: Secondary | ICD-10-CM | POA: Diagnosis not present

## 2022-05-27 DIAGNOSIS — Z961 Presence of intraocular lens: Secondary | ICD-10-CM | POA: Diagnosis not present

## 2022-05-27 DIAGNOSIS — E119 Type 2 diabetes mellitus without complications: Secondary | ICD-10-CM | POA: Diagnosis not present

## 2022-05-31 ENCOUNTER — Ambulatory Visit (INDEPENDENT_AMBULATORY_CARE_PROVIDER_SITE_OTHER): Payer: Medicare Other

## 2022-05-31 DIAGNOSIS — G4733 Obstructive sleep apnea (adult) (pediatric): Secondary | ICD-10-CM

## 2022-06-05 DIAGNOSIS — Z23 Encounter for immunization: Secondary | ICD-10-CM | POA: Diagnosis not present

## 2022-06-11 ENCOUNTER — Telehealth: Payer: Self-pay | Admitting: Pulmonary Disease

## 2022-06-11 NOTE — Telephone Encounter (Signed)
Please call patient and let him know that sleep study done on both nights was not adequate recording was unable to diagnose any sleep apnea.  Will need to have an in lab study if patient is interested in pursuing if he has sleep apnea.  Patient has an upcoming appointment next week can cancel this appointment and we can set up an in lab study if he is in agreement

## 2022-06-11 NOTE — Telephone Encounter (Signed)
ATC pt LVM for him to call office back in regards to needing to repeat sleep study.

## 2022-06-11 NOTE — Telephone Encounter (Signed)
Call patient  Sleep study result  Date of study: 05/31/2022 and 06/01/2022  Impression: Suboptimal study for the diagnosis of sleep disordered breathing on both nights that the study was done only about 30 minutes recorded both nights  Recommendation:  Suboptimal study  Recommend in lab sleep study if clinically appropriate  Follow-up as previously scheduled

## 2022-06-12 NOTE — Telephone Encounter (Signed)
Called and spoke with pt letting him know the info per TP. Pt is unsure if he wants to have the in lab study performed. Stated that he will keep upcoming appt with TP as scheduled to further discuss this with her. Nothing further needed.

## 2022-06-17 ENCOUNTER — Encounter: Payer: Self-pay | Admitting: Adult Health

## 2022-06-17 ENCOUNTER — Ambulatory Visit (INDEPENDENT_AMBULATORY_CARE_PROVIDER_SITE_OTHER): Payer: Medicare Other | Admitting: Adult Health

## 2022-06-17 VITALS — BP 132/76 | HR 67 | Temp 97.6°F | Ht 69.0 in | Wt 208.4 lb

## 2022-06-17 DIAGNOSIS — G4733 Obstructive sleep apnea (adult) (pediatric): Secondary | ICD-10-CM | POA: Diagnosis not present

## 2022-06-17 DIAGNOSIS — I25110 Atherosclerotic heart disease of native coronary artery with unstable angina pectoris: Secondary | ICD-10-CM | POA: Diagnosis not present

## 2022-06-17 DIAGNOSIS — R0683 Snoring: Secondary | ICD-10-CM

## 2022-06-17 NOTE — Patient Instructions (Signed)
Set up for split night sleep study .  Work on healthy weight loss  Do not drive if sleepy  Follow up in 4-6 weeks to discuss test results and treatment plan.

## 2022-06-17 NOTE — Assessment & Plan Note (Signed)
Snoring, daytime sleepiness, nocturnal hypoxemia and history of A-fib all suspicious for underlying sleep apnea.  Unfortunately patient tried twice to have home sleep study but was unable to get enough recordable data.  Patient will be set up for a split-night sleep study in lab   Plan  Patient Instructions  Set up for split night sleep study .  Work on healthy weight loss  Do not drive if sleepy  Follow up in 4-6 weeks to discuss test results and treatment plan.

## 2022-06-17 NOTE — Progress Notes (Signed)
$'@Patient'U$  ID: Todd Sampson, male    DOB: 16-Jun-1935, 87 y.o.   MRN: [id number]  Chief Complaint  Patient presents with   Follow-up    Referring provider: Townsend Roger, MD  HPI: 87 year old male seen for sleep consult March 26, 2022 for snoring, nocturnal hypoxemia and history of A-fib   TEST/EVENTS :  2D echo December 05, 2021 showed EF at 55 to 60%, normal pulmonary artery systolic pressure.  06/17/2022 Follow up: Snoring  Patient presents for a 73-monthfollow-up.  Patient was seen last visit for sleep consult.  Patient has an extensive cardiac history with coronary disease and atrial fibrillation.  Was hospitalized earlier this year and was noted to have nocturnal hypoxemia.  Patient was sent to our office for an evaluation for possible sleep apnea.  He has no history of congestive heart failure.  Patient was set up for a home sleep study.  Unfortunately patient tried twice to do home sleep study and was unsuccessful in getting enough recordable data.  Patient said he could just not keep the devices on his nose or fingers. We went over alternatives.  Patient would like to proceed with further testing.  Will set patient up for a split-night sleep study. Patient has no history of known lung disease.  Did formerly smoke.  He has no significant shortness of breath no cough or wheezing.    Allergies  Allergen Reactions   Penicillins Other (See Comments)    Childhood reaction--pt does not remember    Immunization History  Administered Date(s) Administered   Influenza Inj Mdck Quad Pf 05/08/2022   Zoster Recombinat (Shingrix) 05/20/2019    Past Medical History:  Diagnosis Date   Anemia    Barrett's esophagus    BMI 32.0-32.9,adult 11/09/2019   BPH (benign prostatic hyperplasia) 10/17/2021   Calculus of gallbladder with chronic cholecystitis without obstruction 11/09/2019   Coronary artery disease involving native coronary artery of native heart with unstable angina pectoris ([place]  12/04/2021   Coronary artery disease of native artery of native heart with stable angina pectoris ([place] 06/06/2016   Diet-controlled diabetes mellitus ([place] [place] 06/06/2016   Dyslipidemia (high LDL; low HDL) 06/06/2016   Essential hypertension 06/06/2016   GERD (gastroesophageal reflux disease) 10/17/2021   Guillain-Barre disease ([facility]    H/O: hemorrhoidectomy    Heart disease    Hx of CABG 10/02/2017   Hypercholesteremia 10/17/2021   Hyperlipidemia    Hypertension    Mitral regurgitation 07/26/2019   Non-intractable vomiting with nausea 11/09/2019   Obesity (BMI 30.0-34.9) 12/13/2021   Permanent atrial fibrillation ([facility] 06/06/2016   Postoperative examination 12/13/2019   Preoperative cardiovascular examination 10/02/2017   Primary osteoarthritis of left hip 04/20/2018   Added automatically from request for surgery [id number] Formatting of this note might be different from the original. Added automatically from request for surgery [id number]  Prostate cancer ([facility]    Salivary gland calculi    Special screening examination for viral disease 11/09/2019   Toe pain, bilateral 09/10/2019   Type 2 diabetes, diet controlled ([place] 49/51/8841  Umbilical hernia without obstruction and without gangrene 11/09/2019   Unilateral primary osteoarthritis, right hip 04/30/2021    Tobacco History: Social History   Tobacco Use  Smoking Status Former  Smokeless Tobacco Never   Counseling given: Not Answered   Outpatient Medications Prior to Visit  Medication Sig Dispense Refill   acetaminophen (TYLENOL) 500 MG tablet Take 500-1,000 mg by mouth every 6 (six) hours as needed for  pain.     amLODipine (NORVASC) 5 MG tablet Take 1 tablet (5 mg total) by mouth daily. 90 tablet 3   clopidogrel (PLAVIX) 75 MG tablet Take 1 tablet (75 mg total) by mouth daily with breakfast. 90 tablet 6   ELIQUIS 5 MG TABS tablet TAKE ONE TABLET BY MOUTH TWICE DAILY 180 tablet 1   finasteride (PROSCAR) 5 MG tablet Take 5 mg by mouth  daily.      folic acid (FOLVITE) 1 MG tablet Take 1 mg by mouth daily.     hydrALAZINE (APRESOLINE) 25 MG tablet Take 25 mg by mouth 2 (two) times daily.     hydrochlorothiazide (HYDRODIURIL) 25 MG tablet Take 25 mg by mouth daily.     isosorbide mononitrate (IMDUR) 120 MG 24 hr tablet Take 1 tablet (120 mg total) by mouth daily. 90 tablet 2   lisinopril (ZESTRIL) 40 MG tablet Take 40 mg by mouth daily.     nitroGLYCERIN (NITROSTAT) 0.4 MG SL tablet Place 1 tablet (0.4 mg total) under the tongue every 5 (five) minutes as needed for chest pain. 25 tablet 3   Omega-3 Fatty Acids (FISH OIL) 1000 MG CAPS Take 1,000 mg by mouth daily.     pantoprazole (PROTONIX) 40 MG tablet Take 1 tablet (40 mg total) by mouth daily. 90 tablet 1   rosuvastatin (CRESTOR) 20 MG tablet Take 1 tablet (20 mg total) by mouth daily. 30 tablet 6   thiamine 100 MG tablet Take 100 mg by mouth daily.     empagliflozin (JARDIANCE) 10 MG TABS tablet Take 1 tablet (10 mg total) by mouth daily. (Patient not taking: Reported on 06/17/2022) 30 tablet 3   No facility-administered medications prior to visit.     Review of Systems:   Constitutional:   No  weight loss, night sweats,  Fevers, chills,  +fatigue, or  lassitude.  HEENT:   No headaches,  Difficulty swallowing,  Tooth/dental problems, or  Sore throat,                No sneezing, itching, ear ache, nasal congestion, post nasal drip,   CV:  No chest pain,  Orthopnea, PND, swelling in lower extremities, anasarca, dizziness, palpitations, syncope.   GI  No heartburn, indigestion, abdominal pain, nausea, vomiting, diarrhea, change in bowel habits, loss of appetite, bloody stools.   Resp: No shortness of breath with exertion or at rest.  No excess mucus, no productive cough,  No non-productive cough,  No coughing up of blood.  No change in color of mucus.  No wheezing.  No chest wall deformity  Skin: no rash or lesions.  GU: no dysuria, change in color of urine, no  urgency or frequency.  No flank pain, no hematuria   MS:  No joint pain or swelling.  No decreased range of motion.  No back pain.    Physical Exam  BP 132/76 (BP Location: Left Arm, Patient Position: Sitting, Cuff Size: Normal)   Pulse 67   Temp 97.6 F (36.4 C) (Oral)   Ht '5\' 9"'$  (1.753 m)   Wt 208 lb 6.4 oz (94.5 kg)   SpO2 98%   BMI 30.78 kg/m   GEN: A/Ox3; pleasant , NAD, well nourished    HEENT:  [place]/AT,   NOSE-clear, THROAT-clear, no lesions, no postnasal drip or exudate noted.  Class III MP airway  NECK:  Supple w/ fair ROM; no JVD; normal carotid impulses w/o bruits; no thyromegaly or nodules palpated; no lymphadenopathy.  RESP  Clear  P & A; w/o, wheezes/ rales/ or rhonchi. no accessory muscle use, no dullness to percussion  CARD:  RRR, no m/r/g, no peripheral edema, pulses intact, no cyanosis or clubbing.  GI:   Soft & nt; nml bowel sounds; no organomegaly or masses detected.   Musco: Warm bil, no deformities or joint swelling noted.   Neuro: alert, no focal deficits noted.    Skin: Warm, no lesions or rashes    Lab Results:  CBC    Component Value Date/Time   WBC 8.3 12/05/2021 0622   RBC 5.00 12/05/2021 0622   HGB 14.7 12/05/2021 0622   HGB 15.3 08/05/2019 0818   HCT 42.9 12/05/2021 0622   HCT 44.4 08/05/2019 0818   PLT 166 12/05/2021 0622   PLT 174 08/05/2019 0818   MCV 85.8 12/05/2021 0622   MCV 89 08/05/2019 0818   MCH 29.4 12/05/2021 0622   MCHC 34.3 12/05/2021 0622   RDW 16.7 (H) 12/05/2021 0622   RDW 13.4 08/05/2019 0818    BMET    Component Value Date/Time   NA 135 12/05/2021 0622   NA 137 03/29/2021 0849   K 3.5 12/05/2021 0622   CL 101 12/05/2021 0622   CO2 23 12/05/2021 0622   GLUCOSE 113 (H) 12/05/2021 0622   BUN 18 12/05/2021 0622   BUN 20 03/29/2021 0849   CREATININE 1.07 12/05/2021 0622   CALCIUM 9.1 12/05/2021 0622   GFRNONAA >60 12/05/2021 0622   GFRAA 68 06/28/2020 1546    BNP    Component Value Date/Time    BNP 494.2 (H) 12/05/2021 0622    ProBNP No results found for: "PROBNP"  Imaging: No results found.        No data to display          No results found for: "NITRICOXIDE"      Assessment & Plan:   Snoring Snoring, daytime sleepiness, nocturnal hypoxemia and history of A-fib all suspicious for underlying sleep apnea.  Unfortunately patient tried twice to have home sleep study but was unable to get enough recordable data.  Patient will be set up for a split-night sleep study in lab   Plan  Patient Instructions  Set up for split night sleep study .  Work on healthy weight loss  Do not drive if sleepy  Follow up in 4-6 weeks to discuss test results and treatment plan.       Rexene Edison, NP 06/17/2022

## 2022-06-18 NOTE — Progress Notes (Signed)
Pt notified of lab results

## 2022-06-23 NOTE — Progress Notes (Signed)
Reviewed and agree with assessment/plan.   Chesley Mires, MD [facility] Pulmonary/Critical Care 06/23/2022, 7:18 PM Pager:  [phone]

## 2022-07-02 ENCOUNTER — Other Ambulatory Visit: Payer: Self-pay

## 2022-07-02 NOTE — Telephone Encounter (Signed)
This is Dr. Revankar's pt 

## 2022-07-03 MED ORDER — ROSUVASTATIN CALCIUM 20 MG PO TABS: 20.0000 mg | ORAL_TABLET | Freq: Every day | ORAL | 1 refills | Status: DC

## 2022-07-15 ENCOUNTER — Ambulatory Visit (HOSPITAL_BASED_OUTPATIENT_CLINIC_OR_DEPARTMENT_OTHER): Payer: Medicare Other | Attending: Adult Health | Admitting: Pulmonary Disease

## 2022-07-15 VITALS — Ht 69.0 in | Wt 208.0 lb

## 2022-07-15 DIAGNOSIS — E119 Type 2 diabetes mellitus without complications: Secondary | ICD-10-CM | POA: Diagnosis not present

## 2022-07-15 DIAGNOSIS — I10 Essential (primary) hypertension: Secondary | ICD-10-CM | POA: Diagnosis not present

## 2022-07-15 DIAGNOSIS — E669 Obesity, unspecified: Secondary | ICD-10-CM | POA: Insufficient documentation

## 2022-07-15 DIAGNOSIS — G4733 Obstructive sleep apnea (adult) (pediatric): Secondary | ICD-10-CM | POA: Insufficient documentation

## 2022-07-15 HISTORY — DX: Obstructive sleep apnea (adult) (pediatric): G47.33

## 2022-07-16 ENCOUNTER — Encounter: Payer: Self-pay | Admitting: Cardiology

## 2022-07-16 ENCOUNTER — Ambulatory Visit: Payer: Medicare Other | Attending: Cardiology | Admitting: Cardiology

## 2022-07-16 VITALS — BP 138/70 | HR 66 | Ht 69.0 in | Wt 208.4 lb

## 2022-07-16 DIAGNOSIS — E669 Obesity, unspecified: Secondary | ICD-10-CM | POA: Diagnosis not present

## 2022-07-16 DIAGNOSIS — E785 Hyperlipidemia, unspecified: Secondary | ICD-10-CM | POA: Insufficient documentation

## 2022-07-16 DIAGNOSIS — I340 Nonrheumatic mitral (valve) insufficiency: Secondary | ICD-10-CM | POA: Diagnosis not present

## 2022-07-16 DIAGNOSIS — E119 Type 2 diabetes mellitus without complications: Secondary | ICD-10-CM | POA: Diagnosis not present

## 2022-07-16 DIAGNOSIS — I10 Essential (primary) hypertension: Secondary | ICD-10-CM | POA: Diagnosis not present

## 2022-07-16 DIAGNOSIS — I4821 Permanent atrial fibrillation: Secondary | ICD-10-CM | POA: Diagnosis not present

## 2022-07-16 DIAGNOSIS — Z951 Presence of aortocoronary bypass graft: Secondary | ICD-10-CM | POA: Insufficient documentation

## 2022-07-16 DIAGNOSIS — E782 Mixed hyperlipidemia: Secondary | ICD-10-CM | POA: Diagnosis not present

## 2022-07-16 DIAGNOSIS — I25118 Atherosclerotic heart disease of native coronary artery with other forms of angina pectoris: Secondary | ICD-10-CM | POA: Insufficient documentation

## 2022-07-16 DIAGNOSIS — G4733 Obstructive sleep apnea (adult) (pediatric): Secondary | ICD-10-CM | POA: Insufficient documentation

## 2022-07-16 NOTE — Progress Notes (Signed)
Cardiology Office Note:    Date:  07/16/2022   ID:  Todd Sampson, DOB Sep 04, 1934, MRN [id number]  PCP:  Townsend Roger, MD  Cardiologist:  Jenean Lindau, MD   Referring MD: Townsend Roger, MD    ASSESSMENT:    1. Permanent atrial fibrillation ([place])   2. Mitral valve insufficiency, unspecified etiology   3. Essential hypertension   4. Coronary artery disease of native artery of native heart with stable angina pectoris ([place])   5. OSA (obstructive sleep apnea)   6. Type 2 diabetes, diet controlled ([place])   7. Hx of CABG   8. Mixed hyperlipidemia   9. Obesity (BMI 30.0-34.9)   10. Dyslipidemia (high LDL; low HDL)    PLAN:    In order of problems listed above:  Coronary artery disease: Secondary prevention stressed with patient.  Importance of compliance with diet medication stressed and he vocalized understanding Permanent atrial fibrillation:I discussed with the patient atrial fibrillation, disease process. Management and therapy including rate and rhythm control, anticoagulation benefits and potential risks were discussed extensively with the patient. Patient had multiple questions which were answered to patient's satisfaction. Essential hypertension: Blood pressure stable and diet was emphasized. Mixed dyslipidemia: On lipid-lowering medications followed by primary care.  Lipids from last evaluation reviewed and discussed with the patient. History of snoring and bradycardia at night and oxygen desaturation: This is managed by sleep specialist.  He is undergoing evaluation. Patient will be seen in follow-up appointment in 6 months or earlier if the patient has any concerns    Medication Adjustments/Labs and Tests Ordered: Current medicines are reviewed at length with the patient today.  Concerns regarding medicines are outlined above.  No orders of the defined types were placed in this encounter.  No orders of the defined types were placed in this encounter.    No chief  complaint on file.    History of Present Illness:    Todd Sampson is a 88 y.o. male.  Patient has past medical history of coronary artery disease, essential hypertension, mixed dyslipidemia, diabetes mellitus, obesity and permanent atrial fibrillation.  He has history of snoring and is being elevated for sleep apnea.  He had a sleep study recently and waiting to hear from his physicians.  He denies any chest pain.  He mentions to me that he walks on a regular basis.   Past Medical History:  Diagnosis Date   Anemia    Barrett's esophagus    BMI 32.0-32.9,adult 11/09/2019   BPH (benign prostatic hyperplasia) 10/17/2021   Calculus of gallbladder with chronic cholecystitis without obstruction 11/09/2019   Coronary artery disease involving native coronary artery of native heart with unstable angina pectoris ([place]) 12/04/2021   Coronary artery disease of native artery of native heart with stable angina pectoris ([place]) 06/06/2016   Diet-controlled diabetes mellitus ([place]) 06/06/2016   Dyslipidemia (high LDL; low HDL) 06/06/2016   Essential hypertension 06/06/2016   GERD (gastroesophageal reflux disease) 10/17/2021   Guillain-Barre disease ([place])    H/O: hemorrhoidectomy    Heart disease    Hx of CABG 10/02/2017   Hypercholesteremia 10/17/2021   Hyperlipidemia    Hypertension    Microalbuminuria due to type 2 diabetes mellitus ([place]) 05/08/2022   Mitral regurgitation 07/26/2019   Non-intractable vomiting with nausea 11/09/2019   Obesity (BMI 30.0-34.9) 12/13/2021   Permanent atrial fibrillation ([place]) 06/06/2016   Postoperative examination 12/13/2019   Preoperative cardiovascular examination 10/02/2017   Primary osteoarthritis of left hip 04/20/2018  Added automatically from request for surgery [id number]  Formatting of this note might be different from the original. Added automatically from request for surgery [id number]   Prostate cancer [facility])    Salivary gland calculi    Snoring 03/26/2022    Special screening examination for viral disease 11/09/2019   Toe pain, bilateral 09/10/2019   Type 2 diabetes, diet controlled ([place]) 44/07/270   Umbilical hernia without obstruction and without gangrene 11/09/2019   Unilateral primary osteoarthritis, right hip 04/30/2021    Past Surgical History:  Procedure Laterality Date   APPENDECTOMY     CARDIAC BYPASS     CORONARY STENT INTERVENTION N/A 12/04/2021   Procedure: CORONARY STENT INTERVENTION;  Surgeon: Jettie Booze, MD;  Location: [place] [facility];  Service: Cardiovascular;  Laterality: N/A;   LEFT HEART CATH AND CORS/GRAFTS ANGIOGRAPHY N/A 12/04/2021   Procedure: LEFT HEART CATH AND CORS/GRAFTS ANGIOGRAPHY;  Surgeon: Jettie Booze, MD;  Location: [place] [facility];  Service: Cardiovascular;  Laterality: N/A;   REPLACEMENT TOTAL KNEE BILATERAL      Current Medications: Current Meds  Medication Sig   acetaminophen (TYLENOL) 500 MG tablet Take 500-1,000 mg by mouth every 6 (six) hours as needed for pain.   amLODipine (NORVASC) 5 MG tablet Take 1 tablet (5 mg total) by mouth daily.   clopidogrel (PLAVIX) 75 MG tablet Take 1 tablet (75 mg total) by mouth daily with breakfast.   ELIQUIS 5 MG TABS tablet TAKE ONE TABLET BY MOUTH TWICE DAILY   finasteride (PROSCAR) 5 MG tablet Take 5 mg by mouth daily.    folic acid (FOLVITE) 1 MG tablet Take 1 mg by mouth daily.   hydrALAZINE (APRESOLINE) 25 MG tablet Take 25 mg by mouth 2 (two) times daily.   hydrochlorothiazide (HYDRODIURIL) 25 MG tablet Take 25 mg by mouth daily.   isosorbide mononitrate (IMDUR) 120 MG 24 hr tablet Take 1 tablet (120 mg total) by mouth daily.   lisinopril (ZESTRIL) 40 MG tablet Take 40 mg by mouth daily.   nitroGLYCERIN (NITROSTAT) 0.4 MG SL tablet Place 1 tablet (0.4 mg total) under the tongue every 5 (five) minutes as needed for chest pain.   Omega-3 Fatty Acids (FISH OIL) 1000 MG CAPS Take 1,000 mg by mouth daily.   pantoprazole (PROTONIX) 40 MG  tablet Take 1 tablet (40 mg total) by mouth daily.   rosuvastatin (CRESTOR) 20 MG tablet Take 1 tablet (20 mg total) by mouth daily.   thiamine 100 MG tablet Take 100 mg by mouth daily.     Allergies:   Penicillins   Social History   Socioeconomic History   Marital status: Married    Spouse name: Not on file   Number of children: Not on file   Years of education: Not on file   Highest education level: Not on file  Occupational History   Not on file  Tobacco Use   Smoking status: Former   Smokeless tobacco: Never  Vaping Use   Vaping Use: Never used  Substance and Sexual Activity   Alcohol use: No   Drug use: No   Sexual activity: Not on file  Other Topics Concern   Not on file  Social History Narrative   Not on file   Social Determinants of Health   Financial Resource Strain: Not on file  Food Insecurity: Not on file  Transportation Needs: Not on file  Physical Activity: Not on file  Stress: Not on file  Social Connections: Not on  file     Family History: The patient's family history includes Cancer in his brother and mother; Heart disease in his brother, father, and sister.  ROS:   Please see the history of present illness.    All other systems reviewed and are negative.  EKGs/Labs/Other Studies Reviewed:    The following studies were reviewed today: I discussed my patient at length   Recent Labs: 12/05/2021: B Natriuretic Peptide 494.2; BUN 18; Creatinine, Ser 1.07; Hemoglobin 14.7; Platelets 166; Potassium 3.5; Sodium 135  Recent Lipid Panel    Component Value Date/Time   CHOL 102 08/05/2019 0818   TRIG 60 08/05/2019 0818   HDL 30 (L) 08/05/2019 0818   CHOLHDL 3.4 08/05/2019 0818   LDLCALC 58 08/05/2019 0818    Physical Exam:    VS:  Pulse 66   Ht '5\' 9"'$  (1.753 m)   Wt 208 lb 6.4 oz (94.5 kg)   SpO2 96%   BMI 30.78 kg/m     Wt Readings from Last 3 Encounters:  07/16/22 208 lb 6.4 oz (94.5 kg)  07/15/22 208 lb (94.3 kg)  06/17/22 208 lb 6.4  oz (94.5 kg)     GEN: Patient is in no acute distress HEENT: Normal NECK: No JVD; No carotid bruits LYMPHATICS: No lymphadenopathy CARDIAC: Hear sounds regular, 2/6 systolic murmur at the apex. RESPIRATORY:  Clear to auscultation without rales, wheezing or rhonchi  ABDOMEN: Soft, non-tender, non-distended MUSCULOSKELETAL:  No edema; No deformity  SKIN: Warm and dry NEUROLOGIC:  Alert and oriented x 3 PSYCHIATRIC:  Normal affect   Signed, Jenean Lindau, MD  07/16/2022 1:45 PM    [facility]

## 2022-07-16 NOTE — Patient Instructions (Signed)

## 2022-07-22 ENCOUNTER — Telehealth (HOSPITAL_BASED_OUTPATIENT_CLINIC_OR_DEPARTMENT_OTHER): Payer: Medicare Other | Admitting: Pulmonary Disease

## 2022-07-22 DIAGNOSIS — R0683 Snoring: Secondary | ICD-10-CM

## 2022-07-22 NOTE — Procedures (Signed)
POLYSOMNOGRAPHY  Last, First: Todd Sampson, Todd Sampson MRN: [id number] Gender: Male Age (years): 89 Weight (lbs): 208 DOB: 07/23/1934 BMI: 31 Primary Care: No PCP Epworth Score: 3 Referring: Tammy Parrett Technician: Carolin Coy Interpreting: Laurin Coder MD Study Type: NPSG Ordered Study Type: Split Night CPAP Study date: 07/15/2022 Location: [place] CLINICAL INFORMATION Todd Sampson is a 88 year old Male and was referred to the sleep center for evaluation of G47.33 OSA: Adult and Pediatric (327.23). Indications include Diabetes, Hypertension, Obesity, OSA.  MEDICATIONS Patient self administered medications include: N/A. Medications administered during study include No sleep medicine administered.  SLEEP STUDY TECHNIQUE A multi-channel overnight Polysomnography study was performed. The channels recorded and monitored were central and occipital EEG, electrooculogram (EOG), submentalis EMG (chin), nasal and oral airflow, thoracic and abdominal wall motion, anterior tibialis EMG, snore microphone, electrocardiogram, and a pulse oximetry. TECHNICIAN COMMENTS Comments added by Technician: PATIENT WAS ORDERED AS A SPLIT NIGHT STUDY. Comments added by Scorer: N/A SLEEP ARCHITECTURE The study was initiated at 10:19:47 PM and terminated at 4:43:38 AM. The total recorded time was 383.8 minutes. EEG confirmed total sleep time was 195 minutes yielding a sleep efficiency of 50.8%. Sleep onset after lights out was 5.6 minutes with a REM latency of 358.0 minutes. The patient spent 27.9% of the night in stage N1 sleep, 62.1% in stage N2 sleep, 0.0% in stage N3 and 10% in REM. Wake after sleep onset (WASO) was 183.2 minutes. The Arousal Index was 45.8/hour. RESPIRATORY PARAMETERS There were a total of 102 respiratory disturbances out of which 21 were apneas ( 20 obstructive, 0 mixed, 1 central) and 81 hypopneas. The apnea/hypopnea index (AHI) was 31.4 events/hour. The central sleep apnea index was 0.3  events/hour. The REM AHI was 49.2 events/hour and NREM AHI was 29.4 events/hour. The supine AHI was 31.4 events/hour and the non supine AHI was 0 supine during 100.00% of sleep. Respiratory disturbances were associated with oxygen desaturation down to a nadir of 74.0% during sleep. The mean oxygen saturation during the study was 89.4%. The cumulative time under 88% oxygen saturation was 5.5 minutes.  LEG MOVEMENT DATA The total leg movements were 57 with a resulting leg movement index of 17.5/hr .Associated arousal with leg movement index was 1.8/hr.  CARDIAC DATA The underlying cardiac rhythm was most consistent with sinus rhythm. Mean heart rate during sleep was 56.4 bpm. Additional rhythm abnormalities include None.  IMPRESSIONS Severe Obstructive Sleep apnea(OSA) EKG showed no cardiac abnormalities. Severe Oxygen Desaturation The patient snored with soft snoring volume. No significant periodic leg movements(PLMs) during sleep. However, no significant associated arousals.  DIAGNOSIS Severe Obstructive Sleep Apnea (G47.33) Nocturnal Hypoxemia (G47.36)  RECOMMENDATIONS Therapeutic CPAP titration to determine optimal pressure required to alleviate sleep disordered breathing. Positional therapy avoiding supine position during sleep. Avoid alcohol, sedatives and other CNS depressants that may worsen sleep apnea and disrupt normal sleep architecture. Sleep hygiene should be reviewed to assess factors that may improve sleep quality. Weight management and regular exercise should be initiated or continued.  [Electronically signed] 07/22/2022 08:11 AM  Sherrilyn Rist MD NPI: [id number]

## 2022-07-22 NOTE — Telephone Encounter (Signed)
Please set patient up for an office visit or virtual visit discuss sleep study results and treatment plan.

## 2022-07-22 NOTE — Telephone Encounter (Signed)
Call patient  Sleep study result  Date of study: 07/15/2022  Impression: Severe obstructive sleep apnea Severe oxygen desaturations  Recommendation:  Schedule follow-up for discussion regarding treatment options.  Schedule for in lab titration study for the optimal treatment of severe obstructive sleep apnea with severe oxygen desaturations  Auto CPAP will not be most appropriate treatment with multiple comorbidities.  Encourage weight loss measures

## 2022-07-23 NOTE — Telephone Encounter (Signed)
Spoke with the pt and scheduled visit with TP for 08/09/22 at 3 pm  Nothing further needed  Pt preferred in person appt

## 2022-07-31 DIAGNOSIS — D0462 Carcinoma in situ of skin of left upper limb, including shoulder: Secondary | ICD-10-CM | POA: Diagnosis not present

## 2022-07-31 DIAGNOSIS — L570 Actinic keratosis: Secondary | ICD-10-CM | POA: Diagnosis not present

## 2022-07-31 DIAGNOSIS — C44319 Basal cell carcinoma of skin of other parts of face: Secondary | ICD-10-CM | POA: Diagnosis not present

## 2022-08-02 DIAGNOSIS — C61 Malignant neoplasm of prostate: Secondary | ICD-10-CM | POA: Diagnosis not present

## 2022-08-02 DIAGNOSIS — R351 Nocturia: Secondary | ICD-10-CM | POA: Diagnosis not present

## 2022-08-02 DIAGNOSIS — N401 Enlarged prostate with lower urinary tract symptoms: Secondary | ICD-10-CM | POA: Diagnosis not present

## 2022-08-05 ENCOUNTER — Other Ambulatory Visit: Payer: Self-pay | Admitting: Internal Medicine

## 2022-08-07 ENCOUNTER — Ambulatory Visit: Payer: Medicare Other | Admitting: Internal Medicine

## 2022-08-07 ENCOUNTER — Other Ambulatory Visit: Payer: Self-pay | Admitting: Internal Medicine

## 2022-08-07 ENCOUNTER — Encounter: Payer: Self-pay | Admitting: Internal Medicine

## 2022-08-07 VITALS — BP 114/76 | HR 77 | Temp 97.5°F | Resp 18 | Ht 70.0 in | Wt 208.0 lb

## 2022-08-07 DIAGNOSIS — I10 Essential (primary) hypertension: Secondary | ICD-10-CM

## 2022-08-07 DIAGNOSIS — E1129 Type 2 diabetes mellitus with other diabetic kidney complication: Secondary | ICD-10-CM | POA: Diagnosis not present

## 2022-08-07 DIAGNOSIS — G4733 Obstructive sleep apnea (adult) (pediatric): Secondary | ICD-10-CM | POA: Diagnosis not present

## 2022-08-07 DIAGNOSIS — R809 Proteinuria, unspecified: Secondary | ICD-10-CM | POA: Diagnosis not present

## 2022-08-07 NOTE — Progress Notes (Signed)
Office Visit  Subjective   Patient ID: Todd Sampson   DOB: April 18, 1935   Age: 88 y.o.   MRN: [id number]   Chief Complaint Chief Complaint  Patient presents with   Follow-up     History of Present Illness The patient is a 88 year old Caucasian/White male who returns for a follow-up visit for his T2 diabetes.  On his last visit, we tried to start him on jardiance for his diabetic macroalbuminuria but he could not afford this medication.  Last year we also tried to get him on farxiga but again this medication was too expensive.  Since the last visit, there have been no problems. He remains on no medications; the diabetes is being managed by dietary intervention alone. He is not walking as much as they would like. He specifically denies unexplained abdominal pain, nausea or vomiting and documented hypoglycemia. He does not routinely check blood sugars. He came in fasting today in anticipation of lab work.  His last HgBA1c was done 3 months ago ago and was 6.5%. He has no long term complications of diabetes- no diabetic retinopathy, neuropathy or nephropathy.  We noted he had some macroalbuminuria on his yearly exam in 01/2022.  He remains on an ACE inhibitor.  His last yearly dilated eye exam was on 05/27/2022 and this showed no evidence of diabetic retinopathy.  The patient is a 88 year old Caucasian/White male who presents for a follow-up evaluation of hypertension.  This past year, his BP was borderline where cardiology adjusted his BP meds. Also this past year, his BP was elevated and I started him on bisoprolol.  He however stopped this medication due to his HR going down to the 40's and he was having fatigue.  He only took it for 1 week.   The patient has been checking his blood pressure at home.  He states his systolic blood pressure at home just a few times and it has run in the 130's.  The patient's current medications include: amlodipine 5 mg daily, hydralazine '25mg'$  BID, Imdur 120 mg daily, HCTZ  '25mg'$  daily and lisinopril 40 mg daily. The patient has been tolerating his medications well. The patient denies any headache, visual changes, dizziness, lightheadness, chest pain, shortness of breath, weakness/numbness, and edema.  He reports there have been no other symptoms noted.  He is still using compression hose for his history of venous insufficiency.    The patient has also seen sleep medicine where he had a sleep study done on 07/15/2022.  This showed severe OSA and they are recommending therapeutic CPAP titration.  He goes back this week to see sleep medicine to go over these results.  When he was in the hospital in 11/2021, he was noted to have some complications of bradycardia and hypoxia when he did sleep at night where they kept him for an extra day and want him sent for a sleep study as an outpatient.       Past Medical History Past Medical History:  Diagnosis Date   Anemia    Barrett's esophagus    BMI 32.0-32.9,adult 11/09/2019   BPH (benign prostatic hyperplasia) 10/17/2021   Calculus of gallbladder with chronic cholecystitis without obstruction 11/09/2019   Coronary artery disease involving native coronary artery of native heart with unstable angina pectoris ([place]) 12/04/2021   Coronary artery disease of native artery of native heart with stable angina pectoris ([place]) 06/06/2016   Diet-controlled diabetes mellitus ([place]) 06/06/2016   Dyslipidemia (high LDL; low  HDL) 06/06/2016   Essential hypertension 06/06/2016   GERD (gastroesophageal reflux disease) 10/17/2021   Guillain-Barre disease (Todd Sampson)    H/O: hemorrhoidectomy    Heart disease    Hx of CABG 10/02/2017   Hypercholesteremia 10/17/2021   Hyperlipidemia    Hypertension    Microalbuminuria due to type 2 diabetes mellitus ([place]) 05/08/2022   Mitral regurgitation 07/26/2019   Non-intractable vomiting with nausea 11/09/2019   Obesity (BMI 30.0-34.9) 12/13/2021   Permanent atrial fibrillation ([place]) 06/06/2016    Postoperative examination 12/13/2019   Preoperative cardiovascular examination 10/02/2017   Primary osteoarthritis of left hip 04/20/2018   Added automatically from request for surgery [id number]  Formatting of this note might be different from the original. Added automatically from request for surgery [id number]   Prostate cancer [facility])    Salivary gland calculi    Snoring 03/26/2022   Special screening examination for viral disease 11/09/2019   Toe pain, bilateral 09/10/2019   Type 2 diabetes, diet controlled ([place]) 56/97/9480   Umbilical hernia without obstruction and without gangrene 11/09/2019   Unilateral primary osteoarthritis, right hip 04/30/2021     Allergies Allergies  Allergen Reactions   Penicillins Other (See Comments)    Childhood reaction--pt does not remember     Medications  Current Outpatient Medications:    fluorouracil (EFUDEX) 5 % cream, Apply topically 2 (two) times daily., Disp: , Rfl:    acetaminophen (TYLENOL) 500 MG tablet, Take 500-1,000 mg by mouth every 6 (six) hours as needed for pain., Disp: , Rfl:    amLODipine (NORVASC) 5 MG tablet, Take 1 tablet (5 mg total) by mouth daily., Disp: 90 tablet, Rfl: 3   clopidogrel (PLAVIX) 75 MG tablet, Take 1 tablet (75 mg total) by mouth daily with breakfast., Disp: 90 tablet, Rfl: 6   ELIQUIS 5 MG TABS tablet, TAKE ONE TABLET BY MOUTH TWICE DAILY, Disp: 180 tablet, Rfl: 1   finasteride (PROSCAR) 5 MG tablet, Take 5 mg by mouth daily. , Disp: , Rfl:    folic acid (FOLVITE) 1 MG tablet, Take 1 mg by mouth daily., Disp: , Rfl:    hydrALAZINE (APRESOLINE) 25 MG tablet, Take 25 mg by mouth 2 (two) times daily., Disp: , Rfl:    hydrochlorothiazide (HYDRODIURIL) 25 MG tablet, Take 25 mg by mouth daily., Disp: , Rfl:    isosorbide mononitrate (IMDUR) 120 MG 24 hr tablet, Take 1 tablet (120 mg total) by mouth daily., Disp: 90 tablet, Rfl: 2   lisinopril (ZESTRIL) 40 MG tablet, TAKE ONE TABLET BY MOUTH DAILY, Disp: 90 tablet, Rfl:  1   nitroGLYCERIN (NITROSTAT) 0.4 MG SL tablet, Place 1 tablet (0.4 mg total) under the tongue every 5 (five) minutes as needed for chest pain., Disp: 25 tablet, Rfl: 3   Omega-3 Fatty Acids (FISH OIL) 1000 MG CAPS, Take 1,000 mg by mouth daily., Disp: , Rfl:    pantoprazole (PROTONIX) 40 MG tablet, Take 1 tablet (40 mg total) by mouth daily., Disp: 90 tablet, Rfl: 1   rosuvastatin (CRESTOR) 20 MG tablet, Take 1 tablet (20 mg total) by mouth daily., Disp: 90 tablet, Rfl: 1   thiamine 100 MG tablet, Take 100 mg by mouth daily., Disp: , Rfl:    Review of Systems Review of Systems  Constitutional:  Negative for chills and fever.  Eyes:  Negative for blurred vision and double vision.  Respiratory:  Negative for shortness of breath.   Cardiovascular:  Negative for chest pain, palpitations and leg swelling.  Gastrointestinal:  Negative for abdominal pain, constipation, diarrhea, nausea and vomiting.  Musculoskeletal:  Negative for myalgias.  Neurological:  Negative for dizziness, weakness and headaches.  Psychiatric/Behavioral:  Negative for depression. The patient is not nervous/anxious.        Objective:    Vitals BP 114/76 (BP Location: Left Arm, Patient Position: Sitting, Cuff Size: Large)   Pulse 77   Temp (!) 97.5 F (36.4 C) (Temporal)   Resp 18   Ht '5\' 10"'$  (1.778 m)   Wt 208 lb (94.3 kg)   SpO2 98%   BMI 29.84 kg/m    Physical Examination Physical Exam Constitutional:      Appearance: Normal appearance. He is not ill-appearing.  Cardiovascular:     Rate and Rhythm: Normal rate and regular rhythm.     Pulses: Normal pulses.     Heart sounds: No murmur heard.    No friction rub. No gallop.  Pulmonary:     Effort: Pulmonary effort is normal. No respiratory distress.     Breath sounds: No wheezing, rhonchi or rales.  Abdominal:     General: Abdomen is flat. Bowel sounds are normal. There is no distension.     Palpations: Abdomen is soft.     Tenderness: There is no  abdominal tenderness.  Musculoskeletal:     Right lower leg: No edema.     Left lower leg: No edema.  Skin:    General: Skin is warm and dry.     Findings: No rash.  Neurological:     General: No focal deficit present.     Mental Status: He is alert and oriented to person, place, and time.  Psychiatric:        Mood and Affect: Mood normal.        Behavior: Behavior normal.        Assessment & Plan:   Microalbuminuria due to type 2 diabetes mellitus ([place]) We tried to get him on jardiance and farxiga but these medications were too expensive.  We will continue to control his diabetes.  He will continue on diet and exercise and his A1c has been stable.    Essential hypertension His BP is currently controlled.  We will continue his current BP meds.  OSA (obstructive sleep apnea) I reviewed his sleep study and he goes back this week to speak to the pulmonologist.  They are going to set him up with CPAP.    Return in about 3 months (around 11/05/2022).   Townsend Roger, MD

## 2022-08-07 NOTE — Assessment & Plan Note (Signed)
His BP is currently controlled.  We will continue his current BP meds.

## 2022-08-07 NOTE — Assessment & Plan Note (Signed)
I reviewed his sleep study and he goes back this week to speak to the pulmonologist.  They are going to set him up with CPAP.

## 2022-08-07 NOTE — Assessment & Plan Note (Signed)
We tried to get him on jardiance and farxiga but these medications were too expensive.  We will continue to control his diabetes.  He will continue on diet and exercise and his A1c has been stable.

## 2022-08-09 ENCOUNTER — Ambulatory Visit (INDEPENDENT_AMBULATORY_CARE_PROVIDER_SITE_OTHER): Payer: Medicare Other | Admitting: Adult Health

## 2022-08-09 ENCOUNTER — Encounter: Payer: Self-pay | Admitting: Adult Health

## 2022-08-09 VITALS — BP 144/68 | HR 66 | Temp 97.4°F | Ht 70.0 in | Wt 209.0 lb

## 2022-08-09 DIAGNOSIS — G4733 Obstructive sleep apnea (adult) (pediatric): Secondary | ICD-10-CM

## 2022-08-09 DIAGNOSIS — I25118 Atherosclerotic heart disease of native coronary artery with other forms of angina pectoris: Secondary | ICD-10-CM

## 2022-08-09 NOTE — Assessment & Plan Note (Signed)
Severe sleep apnea.  We discussed sleep study results in detail.  We went over treatment options including weight loss oral appliance and CPAP therapy.  Patient will not be a candidate for oral appliance.  He will proceed with CPAP therapy due to his severity of sleep apnea and multiple comorbidities. Begin CPAP AutoSet 5 to 15 cm H2O.  Plan  Patient Instructions  Begin CPAP At bedtime , wear all night long.  Work on healthy weight loss  Do not drive if sleepy  Follow up in 3 months and As needed

## 2022-08-09 NOTE — Progress Notes (Signed)
$@[email or web address]$  ID: Todd Sampson, male    DOB: February 20, 1935, 88 y.o.   MRN: [id number]  Chief Complaint  Patient presents with   Follow-up    Referring provider: Townsend Roger, MD  HPI: 88 year old male seen for sleep consult March 26, 2022 for snoring, nocturnal hypoxemia and history of A-fib Found to have severe sleep apnea  TEST/EVENTS :  2D echo December 05, 2021 showed EF at 55 to 60%, normal pulmonary artery systolic pressure.    08/09/2022 Follow up : OSA  Patient presents for a 1 month follow-up.  Patient was seen last visit to evaluate for snoring, nocturnal hypoxemia and a history of A-fib felt to be at risk for sleep apnea.  He was set up for home sleep study but was unable to get an accurate test.  He then was set up for a split-night study that was done on July 15, 2022 that showed severe sleep apnea with a AHI at 31.4/hour and SpO2 low at 74%. Titration portion not done .  We discussed his sleep study results in detail.  Went over treatment options including weight loss oral appliance and CPAP.   Allergies  Allergen Reactions   Penicillins Other (See Comments)    Childhood reaction--pt does not remember    Immunization History  Administered Date(s) Administered   Influenza Inj Mdck Quad Pf 05/08/2022   Zoster Recombinat (Shingrix) 05/20/2019    Past Medical History:  Diagnosis Date   Anemia    Barrett's esophagus    BMI 32.0-32.9,adult 11/09/2019   BPH (benign prostatic hyperplasia) 10/17/2021   Calculus of gallbladder with chronic cholecystitis without obstruction 11/09/2019   Coronary artery disease involving native coronary artery of native heart with unstable angina pectoris ([place]) 12/04/2021   Coronary artery disease of native artery of native heart with stable angina pectoris ([place]) 06/06/2016   Diet-controlled diabetes mellitus ([place]) 06/06/2016   Dyslipidemia (high LDL; low HDL) 06/06/2016   Essential hypertension 06/06/2016   GERD (gastroesophageal reflux  disease) 10/17/2021   Guillain-Barre disease ([place])    H/O: hemorrhoidectomy    Heart disease    Hx of CABG 10/02/2017   Hypercholesteremia 10/17/2021   Hyperlipidemia    Hypertension    Microalbuminuria due to type 2 diabetes mellitus ([place]) 05/08/2022   Mitral regurgitation 07/26/2019   Non-intractable vomiting with nausea 11/09/2019   Obesity (BMI 30.0-34.9) 12/13/2021   Permanent atrial fibrillation ([place]) 06/06/2016   Postoperative examination 12/13/2019   Preoperative cardiovascular examination 10/02/2017   Primary osteoarthritis of left hip 04/20/2018   Added automatically from request for surgery [id number]  Formatting of this note might be different from the original. Added automatically from request for surgery [phone]   Prostate cancer [facility])    Salivary gland calculi    Snoring 03/26/2022   Special screening examination for viral disease 11/09/2019   Toe pain, bilateral 09/10/2019   Type 2 diabetes, diet controlled ([place]) 123XX123   Umbilical hernia without obstruction and without gangrene 11/09/2019   Unilateral primary osteoarthritis, right hip 04/30/2021    Tobacco History: Social History   Tobacco Use  Smoking Status Former  Smokeless Tobacco Never   Counseling given: Not Answered   Outpatient Medications Prior to Visit  Medication Sig Dispense Refill   acetaminophen (TYLENOL) 500 MG tablet Take 500-1,000 mg by mouth every 6 (six) hours as needed for pain.     amLODipine (NORVASC) 5 MG tablet Take 1 tablet (5 mg total) by mouth daily. 90 tablet 3   clopidogrel (  PLAVIX) 75 MG tablet Take 1 tablet (75 mg total) by mouth daily with breakfast. 90 tablet 6   ELIQUIS 5 MG TABS tablet TAKE ONE TABLET BY MOUTH TWICE DAILY 180 tablet 1   finasteride (PROSCAR) 5 MG tablet Take 5 mg by mouth daily.      fluorouracil (EFUDEX) 5 % cream Apply topically 2 (two) times daily.     folic acid (FOLVITE) 1 MG tablet Take 1 mg by mouth daily.     hydrALAZINE (APRESOLINE) 25 MG tablet  TAKE ONE TABLET BY MOUTH TWICE DAILY WITH FOOD 180 tablet 1   hydrochlorothiazide (HYDRODIURIL) 25 MG tablet Take 25 mg by mouth daily.     isosorbide mononitrate (IMDUR) 120 MG 24 hr tablet Take 1 tablet (120 mg total) by mouth daily. 90 tablet 2   lisinopril (ZESTRIL) 40 MG tablet TAKE ONE TABLET BY MOUTH DAILY 90 tablet 1   nitroGLYCERIN (NITROSTAT) 0.4 MG SL tablet Place 1 tablet (0.4 mg total) under the tongue every 5 (five) minutes as needed for chest pain. 25 tablet 3   Omega-3 Fatty Acids (FISH OIL) 1000 MG CAPS Take 1,000 mg by mouth daily.     pantoprazole (PROTONIX) 40 MG tablet Take 1 tablet (40 mg total) by mouth daily. 90 tablet 1   rosuvastatin (CRESTOR) 20 MG tablet Take 1 tablet (20 mg total) by mouth daily. 90 tablet 1   thiamine 100 MG tablet Take 100 mg by mouth daily.     No facility-administered medications prior to visit.     Review of Systems:   Constitutional:   No  weight loss, night sweats,  Fevers, chills, fatigue, or  lassitude.  HEENT:   No headaches,  Difficulty swallowing,  Tooth/dental problems, or  Sore throat,                No sneezing, itching, ear ache, nasal congestion, post nasal drip,   CV:  No chest pain,  Orthopnea, PND, swelling in lower extremities, anasarca, dizziness, palpitations, syncope.   GI  No heartburn, indigestion, abdominal pain, nausea, vomiting, diarrhea, change in bowel habits, loss of appetite, bloody stools.   Resp: No shortness of breath with exertion or at rest.  No excess mucus, no productive cough,  No non-productive cough,  No coughing up of blood.  No change in color of mucus.  No wheezing.  No chest wall deformity  Skin: no rash or lesions.  GU: no dysuria, change in color of urine, no urgency or frequency.  No flank pain, no hematuria   MS:  No joint pain or swelling.  No decreased range of motion.  No back pain.    Physical Exam  BP (!) 144/68 (BP Location: Left Arm, Patient Position: Sitting, Cuff Size: Large)    Pulse 66   Temp (!) 97.4 F (36.3 C) (Temporal)   Ht 5' 10"$  (1.778 m)   Wt 209 lb (94.8 kg)   SpO2 98%   BMI 29.99 kg/m   GEN: A/Ox3; pleasant , NAD, well nourished    HEENT:  [place]/AT,   NOSE-clear, THROAT-clear, no lesions, no postnasal drip or exudate noted.   NECK:  Supple w/ fair ROM; no JVD; normal carotid impulses w/o bruits; no thyromegaly or nodules palpated; no lymphadenopathy.    RESP  Clear  P & A; w/o, wheezes/ rales/ or rhonchi. no accessory muscle use, no dullness to percussion  CARD:  RRR, no m/r/g, tr peripheral edema, pulses intact, no cyanosis or clubbing.  GI:  Soft & nt; nml bowel sounds; no organomegaly or masses detected.   Musco: Warm bil, no deformities or joint swelling noted.   Neuro: alert, no focal deficits noted.    Skin: Warm, no lesions or rashes       ProBNP No results found for: "PROBNP"  Imaging: SLEEP STUDY DOCUMENTS  Result Date: 07/19/2022 Ordered by an unspecified provider.         No data to display          No results found for: "NITRICOXIDE"      Assessment & Plan:   OSA (obstructive sleep apnea) Severe sleep apnea.  We discussed sleep study results in detail.  We went over treatment options including weight loss oral appliance and CPAP therapy.  Patient will not be a candidate for oral appliance.  He will proceed with CPAP therapy due to his severity of sleep apnea and multiple comorbidities. Begin CPAP AutoSet 5 to 15 cm H2O.  Plan  Patient Instructions  Begin CPAP At bedtime , wear all night long.  Work on healthy weight loss  Do not drive if sleepy  Follow up in 3 months and As needed        [employer], NP 08/09/2022

## 2022-08-09 NOTE — Addendum Note (Signed)
Addended by: June Leap on: 08/09/2022 03:47 PM   Modules accepted: Orders

## 2022-08-09 NOTE — Patient Instructions (Signed)
Begin CPAP At bedtime , wear all night long.  Work on healthy weight loss  Do not drive if sleepy  Follow up in 3 months and As needed

## 2022-08-09 NOTE — Progress Notes (Signed)
Reviewed and agree with assessment/plan.   Chesley Mires, MD [facility] Pulmonary/Critical Care 08/09/2022, 3:49 PM Pager:  [phone]

## 2022-08-16 ENCOUNTER — Telehealth: Payer: Self-pay | Admitting: Adult Health

## 2022-08-16 NOTE — Telephone Encounter (Signed)
ATC X1 LVM for patient to call the office back. Spoke with [facility] they advise cpap order is still being processed Tammy stated they would call him once the machine is ready for pick up

## 2022-08-20 NOTE — Telephone Encounter (Signed)
Spoke with the pt and notified that we have ordered his CPAP through [employer] and they confirmed order  I provided him with their contact information  Nothing further needed

## 2022-08-20 NOTE — Telephone Encounter (Signed)
Pt calling back in regards to Cpap order

## 2022-09-30 ENCOUNTER — Other Ambulatory Visit: Payer: Self-pay | Admitting: Internal Medicine

## 2022-09-30 ENCOUNTER — Other Ambulatory Visit: Payer: Self-pay | Admitting: Cardiology

## 2022-09-30 NOTE — Telephone Encounter (Signed)
Prescription refill request for Eliquis received. Indication: AF Last office visit: 07/16/22  R Revankar MD Scr: 1.07 on 12/05/21  Epic Age: 88 Weight: 94.5kg  Based on above findings Eliquis 5mg  twice daily is the appropriate dose.  Refill approved.

## 2022-09-30 NOTE — Telephone Encounter (Signed)
Rx refill sent to pharmacy. 

## 2022-10-15 DIAGNOSIS — T161XXA Foreign body in right ear, initial encounter: Secondary | ICD-10-CM | POA: Diagnosis not present

## 2022-10-16 ENCOUNTER — Encounter: Payer: Self-pay | Admitting: Internal Medicine

## 2022-10-16 ENCOUNTER — Ambulatory Visit: Payer: Medicare Other | Admitting: Internal Medicine

## 2022-10-16 VITALS — BP 138/64 | HR 60 | Temp 97.9°F | Resp 16 | Ht 69.5 in | Wt 205.0 lb

## 2022-10-16 DIAGNOSIS — G5603 Carpal tunnel syndrome, bilateral upper limbs: Secondary | ICD-10-CM

## 2022-10-16 HISTORY — DX: Carpal tunnel syndrome, bilateral upper limbs: G56.03

## 2022-10-16 NOTE — Progress Notes (Signed)
Office Visit  Subjective   Patient ID: Todd Sampson   DOB: 05-09-1935   Age: 88 y.o.   MRN: [id number]   Chief Complaint Chief Complaint  Patient presents with   Referral    Would like referral for surgery on hands      History of Present Illness Todd Sampson comes in today with complaints of numbness and weakness of bilateral hands that started about 2 months ago.  He states this has slowly worsened where he has numbness in his fingers that comes and goes and he has weakness with decreased hand grip.  There is no pain.  He has never had this problem in the past.       Past Medical History Past Medical History:  Diagnosis Date   Anemia    Barrett's esophagus    BMI 32.0-32.9,adult 11/09/2019   BPH (benign prostatic hyperplasia) 10/17/2021   Calculus of gallbladder with chronic cholecystitis without obstruction 11/09/2019   Coronary artery disease involving native coronary artery of native heart with unstable angina pectoris 12/04/2021   Coronary artery disease of native artery of native heart with stable angina pectoris 06/06/2016   Diet-controlled diabetes mellitus 06/06/2016   Dyslipidemia (high LDL; low HDL) 06/06/2016   Essential hypertension 06/06/2016   GERD (gastroesophageal reflux disease) 10/17/2021   Guillain-Barre disease    H/O: hemorrhoidectomy    Heart disease    Hx of CABG 10/02/2017   Hypercholesteremia 10/17/2021   Hyperlipidemia    Hypertension    Microalbuminuria due to type 2 diabetes mellitus 05/08/2022   Mitral regurgitation 07/26/2019   Non-intractable vomiting with nausea 11/09/2019   Obesity (BMI 30.0-34.9) 12/13/2021   Permanent atrial fibrillation 06/06/2016   Postoperative examination 12/13/2019   Preoperative cardiovascular examination 10/02/2017   Primary osteoarthritis of left hip 04/20/2018   Added automatically from request for surgery [phone]  Formatting of this note might be different from the original. Added automatically from request  for surgery [phone]   Prostate cancer    Salivary gland calculi    Snoring 03/26/2022   Special screening examination for viral disease 11/09/2019   Toe pain, bilateral 09/10/2019   Type 2 diabetes, diet controlled 10/17/2021   Umbilical hernia without obstruction and without gangrene 11/09/2019   Unilateral primary osteoarthritis, right hip 04/30/2021     Allergies Allergies  Allergen Reactions   Penicillins Other (See Comments)    Childhood reaction--pt does not remember     Medications  Current Outpatient Medications:    acetaminophen (TYLENOL) 500 MG tablet, Take 500-1,000 mg by mouth every 6 (six) hours as needed for pain., Disp: , Rfl:    amLODipine (NORVASC) 5 MG tablet, Take 1 tablet (5 mg total) by mouth daily., Disp: 90 tablet, Rfl: 3   clopidogrel (PLAVIX) 75 MG tablet, Take 1 tablet (75 mg total) by mouth daily with breakfast., Disp: 90 tablet, Rfl: 6   ELIQUIS 5 MG TABS tablet, TAKE ONE TABLET BY MOUTH TWICE DAILY, Disp: 180 tablet, Rfl: 1   finasteride (PROSCAR) 5 MG tablet, Take 5 mg by mouth daily. , Disp: , Rfl:    fluorouracil (EFUDEX) 5 % cream, Apply topically 2 (two) times daily., Disp: , Rfl:    folic acid (FOLVITE) 1 MG tablet, Take 1 mg by mouth daily., Disp: , Rfl:    hydrALAZINE (APRESOLINE) 25 MG tablet, TAKE ONE TABLET BY MOUTH TWICE DAILY WITH FOOD, Disp: 180 tablet, Rfl: 1   hydrochlorothiazide (HYDRODIURIL) 25 MG tablet, TAKE ONE TABLET BY MOUTH  ONCE DAILY, Disp: 90 tablet, Rfl: 3   isosorbide mononitrate (IMDUR) 120 MG 24 hr tablet, Take 1 tablet (120 mg total) by mouth daily., Disp: 90 tablet, Rfl: 2   lisinopril (ZESTRIL) 40 MG tablet, TAKE ONE TABLET BY MOUTH DAILY, Disp: 90 tablet, Rfl: 1   nitroGLYCERIN (NITROSTAT) 0.4 MG SL tablet, Place 1 tablet (0.4 mg total) under the tongue every 5 (five) minutes as needed for chest pain., Disp: 25 tablet, Rfl: 3   Omega-3 Fatty Acids (FISH OIL) 1000 MG CAPS, Take 1,000 mg by mouth daily., Disp: , Rfl:     pantoprazole (PROTONIX) 40 MG tablet, Take 1 tablet (40 mg total) by mouth daily., Disp: 90 tablet, Rfl: 1   rosuvastatin (CRESTOR) 20 MG tablet, TAKE ONE TABLET BY MOUTH DAILY, Disp: 90 tablet, Rfl: 2   thiamine 100 MG tablet, Take 100 mg by mouth daily., Disp: , Rfl:    Review of Systems Review of Systems  Constitutional:  Negative for chills and fever.  Respiratory:  Negative for cough and shortness of breath.   Cardiovascular:  Negative for chest pain, palpitations and leg swelling.  Musculoskeletal:  Negative for back pain, myalgias and neck pain.  Neurological:  Negative for dizziness, weakness and headaches.       Objective:    Vitals BP 138/64 (BP Location: Right Arm, Patient Position: Sitting, Cuff Size: Normal)   Pulse 60   Temp 97.9 F (36.6 C) (Temporal)   Resp 16   Ht 5' 9.5" (1.765 m)   Wt 205 lb (93 kg)   SpO2 95%   BMI 29.84 kg/m    Physical Examination Physical Exam Constitutional:      Appearance: Normal appearance. He is not ill-appearing.  Cardiovascular:     Rate and Rhythm: Normal rate and regular rhythm.     Pulses: Normal pulses.     Heart sounds: No murmur heard.    No friction rub. No gallop.  Pulmonary:     Effort: Pulmonary effort is normal. No respiratory distress.     Breath sounds: No wheezing, rhonchi or rales.  Abdominal:     General: Abdomen is flat. Bowel sounds are normal. There is no distension.     Palpations: Abdomen is soft.     Tenderness: There is no abdominal tenderness.  Musculoskeletal:     Right lower leg: No edema.     Left lower leg: No edema.     Comments: Positive phalens sign in both hands  Skin:    General: Skin is warm and dry.     Findings: No rash.  Neurological:     Mental Status: He is alert.     Comments: Grip strength is a bit reduced 4+/5 in his right hand, normal in his left hand.  Arm strength is normal.        Assessment & Plan:   Bilateral carpal tunnel syndrome We will refer him to Dr.  Mina Marble who is a hand specialist in [facility] for evaluation.    No follow-ups on file.   Crist Fat, MD

## 2022-10-16 NOTE — Assessment & Plan Note (Signed)
We will refer him to Dr. Mina Marble who is a hand specialist in [facility] for evaluation.

## 2022-10-28 ENCOUNTER — Other Ambulatory Visit: Payer: Self-pay | Admitting: Cardiology

## 2022-10-28 DIAGNOSIS — G5603 Carpal tunnel syndrome, bilateral upper limbs: Secondary | ICD-10-CM | POA: Diagnosis not present

## 2022-10-30 DIAGNOSIS — C44612 Basal cell carcinoma of skin of right upper limb, including shoulder: Secondary | ICD-10-CM | POA: Diagnosis not present

## 2022-10-30 DIAGNOSIS — L570 Actinic keratosis: Secondary | ICD-10-CM | POA: Diagnosis not present

## 2022-10-30 DIAGNOSIS — L578 Other skin changes due to chronic exposure to nonionizing radiation: Secondary | ICD-10-CM | POA: Diagnosis not present

## 2022-10-31 DIAGNOSIS — N281 Cyst of kidney, acquired: Secondary | ICD-10-CM | POA: Diagnosis not present

## 2022-10-31 DIAGNOSIS — I2510 Atherosclerotic heart disease of native coronary artery without angina pectoris: Secondary | ICD-10-CM | POA: Diagnosis not present

## 2022-10-31 DIAGNOSIS — E860 Dehydration: Secondary | ICD-10-CM | POA: Diagnosis not present

## 2022-10-31 DIAGNOSIS — R55 Syncope and collapse: Secondary | ICD-10-CM | POA: Diagnosis not present

## 2022-10-31 DIAGNOSIS — Z79899 Other long term (current) drug therapy: Secondary | ICD-10-CM | POA: Diagnosis not present

## 2022-10-31 DIAGNOSIS — E041 Nontoxic single thyroid nodule: Secondary | ICD-10-CM | POA: Diagnosis not present

## 2022-10-31 DIAGNOSIS — I4891 Unspecified atrial fibrillation: Secondary | ICD-10-CM | POA: Diagnosis not present

## 2022-10-31 DIAGNOSIS — I482 Chronic atrial fibrillation, unspecified: Secondary | ICD-10-CM | POA: Diagnosis not present

## 2022-10-31 DIAGNOSIS — Z87891 Personal history of nicotine dependence: Secondary | ICD-10-CM | POA: Diagnosis not present

## 2022-10-31 DIAGNOSIS — Z951 Presence of aortocoronary bypass graft: Secondary | ICD-10-CM | POA: Diagnosis not present

## 2022-10-31 DIAGNOSIS — R110 Nausea: Secondary | ICD-10-CM | POA: Diagnosis not present

## 2022-10-31 DIAGNOSIS — R112 Nausea with vomiting, unspecified: Secondary | ICD-10-CM | POA: Diagnosis not present

## 2022-10-31 DIAGNOSIS — G4733 Obstructive sleep apnea (adult) (pediatric): Secondary | ICD-10-CM | POA: Diagnosis not present

## 2022-10-31 DIAGNOSIS — Z7902 Long term (current) use of antithrombotics/antiplatelets: Secondary | ICD-10-CM | POA: Diagnosis not present

## 2022-10-31 DIAGNOSIS — Z7901 Long term (current) use of anticoagulants: Secondary | ICD-10-CM | POA: Diagnosis not present

## 2022-10-31 DIAGNOSIS — K529 Noninfective gastroenteritis and colitis, unspecified: Secondary | ICD-10-CM | POA: Diagnosis not present

## 2022-10-31 DIAGNOSIS — I700 Atherosclerosis of aorta: Secondary | ICD-10-CM | POA: Diagnosis not present

## 2022-10-31 DIAGNOSIS — J690 Pneumonitis due to inhalation of food and vomit: Secondary | ICD-10-CM | POA: Diagnosis not present

## 2022-10-31 DIAGNOSIS — N179 Acute kidney failure, unspecified: Secondary | ICD-10-CM | POA: Diagnosis not present

## 2022-10-31 DIAGNOSIS — E782 Mixed hyperlipidemia: Secondary | ICD-10-CM | POA: Diagnosis not present

## 2022-10-31 DIAGNOSIS — Z880 Allergy status to penicillin: Secondary | ICD-10-CM | POA: Diagnosis not present

## 2022-10-31 DIAGNOSIS — J9601 Acute respiratory failure with hypoxia: Secondary | ICD-10-CM | POA: Diagnosis not present

## 2022-10-31 DIAGNOSIS — M1990 Unspecified osteoarthritis, unspecified site: Secondary | ICD-10-CM | POA: Diagnosis not present

## 2022-10-31 DIAGNOSIS — R911 Solitary pulmonary nodule: Secondary | ICD-10-CM | POA: Diagnosis not present

## 2022-10-31 DIAGNOSIS — R197 Diarrhea, unspecified: Secondary | ICD-10-CM | POA: Diagnosis not present

## 2022-10-31 DIAGNOSIS — R1111 Vomiting without nausea: Secondary | ICD-10-CM | POA: Diagnosis not present

## 2022-10-31 DIAGNOSIS — R1013 Epigastric pain: Secondary | ICD-10-CM | POA: Diagnosis not present

## 2022-10-31 DIAGNOSIS — R9431 Abnormal electrocardiogram [ECG] [EKG]: Secondary | ICD-10-CM | POA: Diagnosis not present

## 2022-10-31 DIAGNOSIS — I509 Heart failure, unspecified: Secondary | ICD-10-CM | POA: Diagnosis not present

## 2022-11-06 ENCOUNTER — Encounter: Payer: Self-pay | Admitting: Internal Medicine

## 2022-11-06 ENCOUNTER — Ambulatory Visit: Payer: Medicare Other | Admitting: Internal Medicine

## 2022-11-06 VITALS — BP 128/88 | HR 50 | Temp 98.1°F | Resp 16 | Ht 70.0 in | Wt 204.2 lb

## 2022-11-06 DIAGNOSIS — N179 Acute kidney failure, unspecified: Secondary | ICD-10-CM | POA: Insufficient documentation

## 2022-11-06 DIAGNOSIS — E041 Nontoxic single thyroid nodule: Secondary | ICD-10-CM | POA: Insufficient documentation

## 2022-11-06 DIAGNOSIS — J690 Pneumonitis due to inhalation of food and vomit: Secondary | ICD-10-CM | POA: Insufficient documentation

## 2022-11-06 HISTORY — DX: Nontoxic single thyroid nodule: E04.1

## 2022-11-06 MED ORDER — APIXABAN 2.5 MG PO TABS: 2.5000 mg | ORAL_TABLET | Freq: Two times a day (BID) | ORAL | 1 refills | Status: DC

## 2022-11-06 NOTE — Assessment & Plan Note (Signed)
He had gastroenteritis with acute renal failure where we will recheck his lytes today.  Continue to remain off HCTZ.  His BP is doing well.

## 2022-11-06 NOTE — Assessment & Plan Note (Signed)
They noted some incidental thyroid nodules on a CT scan of his chest.  We will obtain a thyroid US.

## 2022-11-06 NOTE — Assessment & Plan Note (Signed)
He is to continue on his levaquin at this time.

## 2022-11-06 NOTE — Progress Notes (Signed)
Office Visit  Subjective   Patient ID: Todd Sampson   DOB: [birth year]   Age: 88 y.o.   MRN: [id number]   Chief Complaint Chief Complaint  Patient presents with   Hospitalization Follow-up     History of Present Illness Todd Sampson is a 88 yo male who comes in today for a hospital followup where he was admitted to [facility] from 10/31/2022 until 11/02/2022 with gastroenteritis.  He developed nausea, vomiting and diarrhea and presented to the ER with SOB.  He was noted to be satting at 82% on RA where he was admitted to the hospital for acute hypoxic respiratory failure due to aspiration pneumonia as well as ARF due to his acute gastroenteritis.  He was placed on supplemental oxygen and started on antiobiotics and sent home on a course of levaquin.  His initial creatinine was 1.7 and he was give fluids.  His ACE-I and HCTZ were stopped but his lisinopril was restarted the day after discharge with instructions to hold HCTZ until followup with myself.  They cut his eliquis dose in half due to the ARF as well.  Today,  he denies any further nausea, vomiting, diarrhea, SOB and he is not on home oxygen.  He is still on his antibiotic and denies any cough or SOB.  He is drinking ok but has not been eating as well.       Past Medical History Past Medical History:  Diagnosis Date   Anemia    Barrett's esophagus    BMI 32.0-32.9,adult 11/09/2019   BPH (benign prostatic hyperplasia) 10/17/2021   Calculus of gallbladder with chronic cholecystitis without obstruction 11/09/2019   Coronary artery disease involving native coronary artery of native heart with unstable angina pectoris (HCC) 12/04/2021   Coronary artery disease of native artery of native heart with stable angina pectoris (HCC) 06/06/2016   Diet-controlled diabetes mellitus (HCC) 06/06/2016   Dyslipidemia (high LDL; low HDL) 06/06/2016   Essential hypertension 06/06/2016   GERD (gastroesophageal reflux disease) 10/17/2021   Guillain-Barre disease  (HCC)    H/O: hemorrhoidectomy    Heart disease    Hx of CABG 10/02/2017   Hypercholesteremia 10/17/2021   Hyperlipidemia    Hypertension    Microalbuminuria due to type 2 diabetes mellitus (HCC) 05/08/2022   Mitral regurgitation 07/26/2019   Non-intractable vomiting with nausea 11/09/2019   Obesity (BMI 30.0-34.9) 12/13/2021   Permanent atrial fibrillation (HCC) 06/06/2016   Postoperative examination 12/13/2019   Preoperative cardiovascular examination 10/02/2017   Primary osteoarthritis of left hip 04/20/2018   Added automatically from request for surgery [phone]  Formatting of this note might be different from the original. Added automatically from request for surgery [phone]   Prostate cancer [facility])    Salivary gland calculi    Snoring 03/26/2022   Special screening examination for viral disease 11/09/2019   Toe pain, bilateral 09/10/2019   Type 2 diabetes, diet controlled (HCC) 10/17/2021   Umbilical hernia without obstruction and without gangrene 11/09/2019   Unilateral primary osteoarthritis, right hip 04/30/2021     Allergies Allergies  Allergen Reactions   Penicillins Other (See Comments)    Childhood reaction--pt does not remember     Medications  Current Outpatient Medications:    acetaminophen (TYLENOL) 500 MG tablet, Take 500-1,000 mg by mouth every 6 (six) hours as needed for pain., Disp: , Rfl:    amLODipine (NORVASC) 5 MG tablet, Take 1 tablet (5 mg total) by mouth daily., Disp: 90 tablet, Rfl: 2  apixaban (ELIQUIS) 2.5 MG TABS tablet, Take 1 tablet (2.5 mg total) by mouth 2 (two) times daily., Disp: 180 tablet, Rfl: 1   clopidogrel (PLAVIX) 75 MG tablet, Take 1 tablet (75 mg total) by mouth daily with breakfast., Disp: 90 tablet, Rfl: 6   finasteride (PROSCAR) 5 MG tablet, Take 5 mg by mouth daily. , Disp: , Rfl:    fluorouracil (EFUDEX) 5 % cream, Apply topically 2 (two) times daily., Disp: , Rfl:    folic acid (FOLVITE) 1 MG tablet, Take 1 mg by mouth  daily., Disp: , Rfl:    hydrALAZINE (APRESOLINE) 25 MG tablet, TAKE ONE TABLET BY MOUTH TWICE DAILY WITH FOOD, Disp: 180 tablet, Rfl: 1   isosorbide mononitrate (IMDUR) 120 MG 24 hr tablet, Take 1 tablet (120 mg total) by mouth daily., Disp: 90 tablet, Rfl: 2   lisinopril (ZESTRIL) 40 MG tablet, TAKE ONE TABLET BY MOUTH DAILY, Disp: 90 tablet, Rfl: 1   nitroGLYCERIN (NITROSTAT) 0.4 MG SL tablet, Place 1 tablet (0.4 mg total) under the tongue every 5 (five) minutes as needed for chest pain., Disp: 25 tablet, Rfl: 3   Omega-3 Fatty Acids (FISH OIL) 1000 MG CAPS, Take 1,000 mg by mouth daily., Disp: , Rfl:    pantoprazole (PROTONIX) 40 MG tablet, Take 1 tablet (40 mg total) by mouth daily., Disp: 90 tablet, Rfl: 1   rosuvastatin (CRESTOR) 20 MG tablet, TAKE ONE TABLET BY MOUTH DAILY, Disp: 90 tablet, Rfl: 2   thiamine 100 MG tablet, Take 100 mg by mouth daily., Disp: , Rfl:    Review of Systems Review of Systems  Constitutional:  Negative for chills and fever.  Respiratory:  Negative for shortness of breath and wheezing.   Cardiovascular:  Negative for chest pain, palpitations and leg swelling.  Gastrointestinal:  Negative for abdominal pain, constipation, diarrhea, nausea and vomiting.  Musculoskeletal:  Negative for myalgias.  Neurological:  Negative for dizziness, weakness and headaches.       Objective:    Vitals BP 128/88   Pulse (!) 50   Temp 98.1 F (36.7 C)   Resp 16   Ht 5\' 10"  (1.778 m)   Wt 204 lb 3.2 oz (92.6 kg)   SpO2 97%   BMI 29.30 kg/m    Physical Examination Physical Exam Constitutional:      Appearance: Normal appearance. He is not ill-appearing.  Cardiovascular:     Rate and Rhythm: Normal rate and regular rhythm.     Pulses: Normal pulses.     Heart sounds: No murmur heard.    No friction rub. No gallop.  Pulmonary:     Effort: Pulmonary effort is normal. No respiratory distress.     Breath sounds: No wheezing, rhonchi or rales.  Abdominal:      General: Abdomen is flat. Bowel sounds are normal. There is no distension.     Palpations: Abdomen is soft.     Tenderness: There is no abdominal tenderness.  Musculoskeletal:     Right lower leg: No edema.     Left lower leg: No edema.  Skin:    General: Skin is warm and dry.     Findings: No rash.  Neurological:     Mental Status: He is alert.        Assessment & Plan:   Acute renal failure (HCC) He had gastroenteritis with acute renal failure where we will recheck his lytes today.  Continue to remain off HCTZ.  His BP is doing well.  Thyroid nodule They noted some incidental thyroid nodules on a CT scan of his chest.  We will obtain a thyroid US.  Aspiration pneumonia due to vomit [facility]) He is to continue on his levaquin at this time.    No follow-ups on file.   Crist Fat, MD

## 2022-11-07 ENCOUNTER — Ambulatory Visit: Payer: Medicare Other | Admitting: Adult Health

## 2022-11-07 LAB — BASIC METABOLIC PANEL
BUN/Creatinine Ratio: 14 (ref 10–24)
BUN: 21 mg/dL (ref 8–27)
CO2: 22 mmol/L (ref 20–29)
Calcium: 9.2 mg/dL (ref 8.6–10.2)
Chloride: 100 mmol/L (ref 96–106)
Creatinine, Ser: 1.47 mg/dL — ABNORMAL HIGH (ref 0.76–1.27)
Glucose: 118 mg/dL — ABNORMAL HIGH (ref 70–99)
Potassium: 4.5 mmol/L (ref 3.5–5.2)
Sodium: 140 mmol/L (ref 134–144)
eGFR: 46 mL/min/{1.73_m2} — ABNORMAL LOW (ref >59)

## 2022-11-08 ENCOUNTER — Other Ambulatory Visit: Payer: Self-pay | Admitting: Internal Medicine

## 2022-11-11 ENCOUNTER — Ambulatory Visit: Payer: Medicare Other | Admitting: Internal Medicine

## 2022-11-11 ENCOUNTER — Encounter: Payer: Self-pay | Admitting: Internal Medicine

## 2022-11-11 VITALS — BP 148/70 | HR 51 | Temp 98.1°F | Resp 16 | Ht 70.0 in | Wt 203.4 lb

## 2022-11-11 DIAGNOSIS — G4733 Obstructive sleep apnea (adult) (pediatric): Secondary | ICD-10-CM | POA: Diagnosis not present

## 2022-11-11 DIAGNOSIS — E1121 Type 2 diabetes mellitus with diabetic nephropathy: Secondary | ICD-10-CM | POA: Insufficient documentation

## 2022-11-11 DIAGNOSIS — I10 Essential (primary) hypertension: Secondary | ICD-10-CM | POA: Diagnosis not present

## 2022-11-11 DIAGNOSIS — E1129 Type 2 diabetes mellitus with other diabetic kidney complication: Secondary | ICD-10-CM | POA: Diagnosis not present

## 2022-11-11 DIAGNOSIS — R809 Proteinuria, unspecified: Secondary | ICD-10-CM

## 2022-11-11 NOTE — Assessment & Plan Note (Signed)
I will check a HgBA1c on him today with goal <7%.

## 2022-11-11 NOTE — Progress Notes (Signed)
Office Visit  Subjective   Patient ID: Todd Sampson   DOB: 1935-04-04   Age: 88 y.o.   MRN: [id number]   Chief Complaint Chief Complaint  Patient presents with   Follow-up     History of Present Illness Mr. Romberger is a 88 yo male where I saw him last week for a hospital followup where he was admitted to [facility] from 10/31/2022 until 11/02/2022 with gastroenteritis.  He developed nausea, vomiting and diarrhea and presented to the ER with SOB.  He was noted to be satting at 82% on RA where he was admitted to the hospital for acute hypoxic respiratory failure due to aspiration pneumonia as well as ARF due to his acute gastroenteritis.  He was placed on supplemental oxygen and started on antiobiotics and sent home on a course of levaquin.  His initial creatinine was 1.7 and he was give fluids.  His ACE-I and HCTZ were stopped but his lisinopril was restarted the day after discharge with instructions to hold HCTZ until followup with myself.  They cut his eliquis dose in half due to the ARF as well.  Today,  he denies any further nausea, vomiting, diarrhea, SOB and he is not on home oxygen.  He is still on his antibiotic and denies any cough or SOB.  He is eating and drinking well.  I did lab tests on him on 11/06/2022 and this showed a BUN of 21 and a creatinine of 1.4.  The patient is a 88 year old Caucasian/White male who returns for a follow-up visit for his T2 diabetes.  This past year, we tried to start him on jardiance for his diabetic macroalbuminuria but he could not afford this medication.  Last year we also tried to get him on farxiga but again this medication was too expensive.  Since the last visit, there have been no problems. He remains on no medications; the diabetes is being managed by dietary intervention alone. He is not walking as much as they would like. He specifically denies unexplained abdominal pain, nausea or vomiting and documented hypoglycemia. He does not routinely check blood sugars. He  came in fasting today in anticipation of lab work.  His last HgBA1c was done in 05/2022 and was 6.5%. He has no long term complications of diabetes- no diabetic retinopathy, neuropathy or nephropathy.  We noted he had some macroalbuminuria on his yearly exam in 01/2022.  He remains on an ACE inhibitor.  His last yearly dilated eye exam was on 05/27/2022 and this showed no evidence of diabetic retinopathy.   The patient is a 88 year old Caucasian/White male who presents for a follow-up evaluation of hypertension.  This past year, his BP was borderline where cardiology adjusted his BP meds. Also this past year, his BP was elevated and I started him on bisoprolol.  He however stopped this medication due to his HR going down to the 40's and he was having fatigue.  He only took it for 1 week.   They did stop him off his HCTZ when he was hospitalized about 10 days ago and he has not restarted it yet.  The patient has been checking his blood pressure at home.  He states his systolic blood pressure at home just a few times and it has run in the 140-160's.  The patient's current medications include: amlodipine 5 mg daily, hydralazine 25mg  BID, Imdur 120 mg daily, HCTZ 25mg  daily (on hold) and lisinopril 40 mg daily. The patient has  been tolerating his medications well. The patient denies any headache, visual changes, dizziness, lightheadness, chest pain, shortness of breath, weakness/numbness, and edema.  He reports there have been no other symptoms noted.  He is still using compression hose for his history of venous insufficiency.    The patient has also seen sleep medicine where he had a sleep study done on 07/15/2022.  This showed severe OSA and they are recommending therapeutic CPAP titration.  He did perform this and saw pulmonary in 08/09/2022 and they have set him up for an autoCPAP where he states he is compliant wit this.  When he was in the hospital in 11/2021, he was noted to have some complications of bradycardia  and hypoxia when he did sleep at night where they kept him for an extra day and want him sent for a sleep study as an outpatient.       Past Medical History Past Medical History:  Diagnosis Date   Anemia    Barrett's esophagus    BMI 32.0-32.9,adult 11/09/2019   BPH (benign prostatic hyperplasia) 10/17/2021   Calculus of gallbladder with chronic cholecystitis without obstruction 11/09/2019   Coronary artery disease involving native coronary artery of native heart with unstable angina pectoris (HCC) 12/04/2021   Coronary artery disease of native artery of native heart with stable angina pectoris (HCC) 06/06/2016   Diet-controlled diabetes mellitus (HCC) 06/06/2016   Dyslipidemia (high LDL; low HDL) 06/06/2016   Essential hypertension 06/06/2016   GERD (gastroesophageal reflux disease) 10/17/2021   Guillain-Barre disease (HCC)    H/O: hemorrhoidectomy    Heart disease    Hx of CABG 10/02/2017   Hypercholesteremia 10/17/2021   Hyperlipidemia    Hypertension    Microalbuminuria due to type 2 diabetes mellitus (HCC) 05/08/2022   Mitral regurgitation 07/26/2019   Non-intractable vomiting with nausea 11/09/2019   Obesity (BMI 30.0-34.9) 12/13/2021   Permanent atrial fibrillation (HCC) 06/06/2016   Postoperative examination 12/13/2019   Preoperative cardiovascular examination 10/02/2017   Primary osteoarthritis of left hip 04/20/2018   Added automatically from request for surgery [phone]  Formatting of this note might be different from the original. Added automatically from request for surgery [phone]   Prostate cancer [facility])    Salivary gland calculi    Snoring 03/26/2022   Special screening examination for viral disease 11/09/2019   Toe pain, bilateral 09/10/2019   Type 2 diabetes, diet controlled (HCC) 10/17/2021   Umbilical hernia without obstruction and without gangrene 11/09/2019   Unilateral primary osteoarthritis, right hip 04/30/2021     Allergies Allergies  Allergen  Reactions   Penicillins Other (See Comments)    Childhood reaction--pt does not remember     Medications  Current Outpatient Medications:    acetaminophen (TYLENOL) 500 MG tablet, Take 500-1,000 mg by mouth every 6 (six) hours as needed for pain., Disp: , Rfl:    amLODipine (NORVASC) 5 MG tablet, Take 1 tablet (5 mg total) by mouth daily., Disp: 90 tablet, Rfl: 2   apixaban (ELIQUIS) 2.5 MG TABS tablet, Take 1 tablet (2.5 mg total) by mouth 2 (two) times daily., Disp: 180 tablet, Rfl: 1   clopidogrel (PLAVIX) 75 MG tablet, Take 1 tablet (75 mg total) by mouth daily with breakfast., Disp: 90 tablet, Rfl: 6   finasteride (PROSCAR) 5 MG tablet, Take 5 mg by mouth daily. , Disp: , Rfl:    fluorouracil (EFUDEX) 5 % cream, Apply topically 2 (two) times daily., Disp: , Rfl:    folic acid (FOLVITE) 1  MG tablet, Take 1 mg by mouth daily., Disp: , Rfl:    hydrALAZINE (APRESOLINE) 25 MG tablet, TAKE ONE TABLET BY MOUTH TWICE DAILY WITH FOOD, Disp: 180 tablet, Rfl: 1   isosorbide mononitrate (IMDUR) 120 MG 24 hr tablet, Take 1 tablet (120 mg total) by mouth daily., Disp: 90 tablet, Rfl: 2   lisinopril (ZESTRIL) 40 MG tablet, TAKE ONE TABLET BY MOUTH DAILY, Disp: 90 tablet, Rfl: 1   nitroGLYCERIN (NITROSTAT) 0.4 MG SL tablet, Place 1 tablet (0.4 mg total) under the tongue every 5 (five) minutes as needed for chest pain., Disp: 25 tablet, Rfl: 3   Omega-3 Fatty Acids (FISH OIL) 1000 MG CAPS, Take 1,000 mg by mouth daily., Disp: , Rfl:    pantoprazole (PROTONIX) 40 MG tablet, TAKE ONE TABLET BY MOUTH ONCE DAILY, Disp: 90 tablet, Rfl: 1   rosuvastatin (CRESTOR) 20 MG tablet, TAKE ONE TABLET BY MOUTH DAILY, Disp: 90 tablet, Rfl: 2   thiamine 100 MG tablet, Take 100 mg by mouth daily., Disp: , Rfl:    Review of Systems Review of Systems  Constitutional:  Negative for chills, fever and malaise/fatigue.  Eyes:  Negative for blurred vision and double vision.  Respiratory:  Negative for cough and shortness of  breath.   Cardiovascular:  Negative for chest pain, palpitations and leg swelling.  Gastrointestinal:  Negative for abdominal pain, constipation, diarrhea, nausea and vomiting.       Still a little loose but not diarrhea  Musculoskeletal:  Negative for myalgias.  Neurological:  Negative for dizziness, weakness and headaches.  Psychiatric/Behavioral:  Negative for depression. The patient is not nervous/anxious.        Objective:    Vitals BP (!) 148/70   Pulse (!) 51   Temp 98.1 F (36.7 C)   Resp 16   Ht 5\' 10"  (1.778 m)   Wt 203 lb 6.4 oz (92.3 kg)   SpO2 97%   BMI 29.18 kg/m    Physical Examination Physical Exam Constitutional:      Appearance: Normal appearance. He is not ill-appearing.  Cardiovascular:     Rate and Rhythm: Normal rate and regular rhythm.     Pulses: Normal pulses.     Heart sounds: No murmur heard.    No friction rub. No gallop.  Pulmonary:     Effort: Pulmonary effort is normal. No respiratory distress.     Breath sounds: No wheezing, rhonchi or rales.  Abdominal:     General: Abdomen is flat. Bowel sounds are normal. There is no distension.     Palpations: Abdomen is soft.     Tenderness: There is no abdominal tenderness.  Musculoskeletal:     Right lower leg: No edema.     Left lower leg: No edema.  Skin:    General: Skin is warm and dry.     Findings: No rash.  Neurological:     General: No focal deficit present.     Mental Status: He is alert and oriented to person, place, and time.  Psychiatric:        Mood and Affect: Mood normal.        Behavior: Behavior normal.        Assessment & Plan:   Essential hypertension His BP is currently elevated but he is not back on his HCTZ yet.  We will repeat his BMP and if this is normal, we will restart him on his diuretic.  Microalbuminuria due to type 2 diabetes mellitus (HCC) I  will check a HgBA1c on him today with goal <7%.  OSA (obstructive sleep apnea) I reviewed his notes from  pulmonary.  He is compliant with CPAP and goes back in the next few weeks to see pulmonary again.    Return in about 3 months (around 02/11/2023).   Crist Fat, MD

## 2022-11-11 NOTE — Assessment & Plan Note (Signed)
His BP is currently elevated but he is not back on his HCTZ yet.  We will repeat his BMP and if this is normal, we will restart him on his diuretic.

## 2022-11-11 NOTE — Assessment & Plan Note (Signed)
I reviewed his notes from pulmonary.  He is compliant with CPAP and goes back in the next few weeks to see pulmonary again.

## 2022-11-12 LAB — BASIC METABOLIC PANEL
BUN/Creatinine Ratio: 14 (ref 10–24)
BUN: 17 mg/dL (ref 8–27)
CO2: 24 mmol/L (ref 20–29)
Calcium: 9.3 mg/dL (ref 8.6–10.2)
Chloride: 104 mmol/L (ref 96–106)
Creatinine, Ser: 1.24 mg/dL (ref 0.76–1.27)
Glucose: 87 mg/dL (ref 70–99)
Potassium: 4.1 mmol/L (ref 3.5–5.2)
Sodium: 141 mmol/L (ref 134–144)
eGFR: 57 mL/min/{1.73_m2} — ABNORMAL LOW (ref >59)

## 2022-11-12 LAB — HEMOGLOBIN A1C
Est. average glucose Bld gHb Est-mCnc: 137 mg/dL
Hgb A1c MFr Bld: 6.4 % — ABNORMAL HIGH (ref 4.8–5.6)

## 2022-11-20 ENCOUNTER — Ambulatory Visit (INDEPENDENT_AMBULATORY_CARE_PROVIDER_SITE_OTHER): Payer: Medicare Other | Admitting: Adult Health

## 2022-11-20 ENCOUNTER — Encounter: Payer: Self-pay | Admitting: Adult Health

## 2022-11-20 VITALS — BP 148/84 | HR 74 | Ht 69.0 in | Wt 208.0 lb

## 2022-11-20 DIAGNOSIS — G4733 Obstructive sleep apnea (adult) (pediatric): Secondary | ICD-10-CM

## 2022-11-20 DIAGNOSIS — I10 Essential (primary) hypertension: Secondary | ICD-10-CM

## 2022-11-20 DIAGNOSIS — G5603 Carpal tunnel syndrome, bilateral upper limbs: Secondary | ICD-10-CM | POA: Diagnosis not present

## 2022-11-20 NOTE — Assessment & Plan Note (Addendum)
Blood pressure is slightly elevated today.  Patient endorses compliance with his medication regimen.  Is asymptomatic.  Advised to follow-up with cardiology and primary care.

## 2022-11-20 NOTE — Patient Instructions (Addendum)
Continue on CPAP At bedtime , wear all night long.  Work on healthy weight loss  Do not drive if sleepy  Follow up with cardiology and Primary MD for Hypertension.  Follow up in 6  months and As needed

## 2022-11-20 NOTE — Assessment & Plan Note (Signed)
Severe obstructive sleep apnea with excellent control and compliance on nocturnal CPAP.  Patient is continue on current settings.  Plan  Patient Instructions  Continue on CPAP At bedtime , wear all night long.  Work on healthy weight loss  Do not drive if sleepy  Follow up with cardiology and Primary MD for Hypertension.  Follow up in 6  months and As needed

## 2022-11-20 NOTE — Progress Notes (Signed)
@Patient  ID: Todd Sampson, male    DOB: April 19, 1935, 88 y.o.   MRN: [id number]  Chief Complaint  Patient presents with   Follow-up    Referring provider: Crist Fat, MD  HPI: 88 year old male seen for sleep consult March 26, 2022 for snoring and nocturnal hypoxemia found to have severe sleep apnea Medical history significant for  atrial fibrillation  TEST/EVENTS :  2D echo December 05, 2021 showed EF at 55 to 60%, normal pulmonary artery systolic pressure.   July 15, 2022 that showed severe sleep apnea with a AHI at 31.4/hour and SpO2 low at 74%. Titration portion not done .   11/20/2022 Follow up : OSA  Patient returns for 33-month follow-up.  Patient was seen last visit after recent sleep study found severe sleep apnea.  Patient was started on CPAP.  Patient says he is getting used to CPAP.  Does feel that he may benefit from CPAP with decreased daytime sleepiness.  He is using a fullface mask.  CPAP download shows excellent compliance with 100% usage.  Daily average usage at 7 hours.  Patient is on auto CPAP 5 to 15 cm H2O.  AHI 2.5/hour.  Daily average pressure at 11.9 cm H2O.   Full face  Doing well   Allergies  Allergen Reactions   Penicillins Other (See Comments)    Childhood reaction--pt does not remember    Immunization History  Administered Date(s) Administered   Influenza Inj Mdck Quad Pf 05/08/2022   Zoster Recombinat (Shingrix) 05/20/2019    Past Medical History:  Diagnosis Date   Anemia    Barrett's esophagus    BMI 32.0-32.9,adult 11/09/2019   BPH (benign prostatic hyperplasia) 10/17/2021   Calculus of gallbladder with chronic cholecystitis without obstruction 11/09/2019   Coronary artery disease involving native coronary artery of native heart with unstable angina pectoris (HCC) 12/04/2021   Coronary artery disease of native artery of native heart with stable angina pectoris (HCC) 06/06/2016   Diet-controlled diabetes mellitus (HCC) 06/06/2016    Dyslipidemia (high LDL; low HDL) 06/06/2016   Essential hypertension 06/06/2016   GERD (gastroesophageal reflux disease) 10/17/2021   Guillain-Barre disease (HCC)    H/O: hemorrhoidectomy    Heart disease    Hx of CABG 10/02/2017   Hypercholesteremia 10/17/2021   Hyperlipidemia    Hypertension    Microalbuminuria due to type 2 diabetes mellitus (HCC) 05/08/2022   Mitral regurgitation 07/26/2019   Non-intractable vomiting with nausea 11/09/2019   Obesity (BMI 30.0-34.9) 12/13/2021   Permanent atrial fibrillation (HCC) 06/06/2016   Postoperative examination 12/13/2019   Preoperative cardiovascular examination 10/02/2017   Primary osteoarthritis of left hip 04/20/2018   Added automatically from request for surgery [id number]  Formatting of this note might be different from the original. Added automatically from request for surgery [id number]   Prostate cancer [facility])    Salivary gland calculi    Snoring 03/26/2022   Special screening examination for viral disease 11/09/2019   Toe pain, bilateral 09/10/2019   Type 2 diabetes, diet controlled (HCC) 10/17/2021   Umbilical hernia without obstruction and without gangrene 11/09/2019   Unilateral primary osteoarthritis, right hip 04/30/2021    Tobacco History: Social History   Tobacco Use  Smoking Status Former  Smokeless Tobacco Never   Counseling given: Not Answered   Outpatient Medications Prior to Visit  Medication Sig Dispense Refill   acetaminophen (TYLENOL) 500 MG tablet Take 500-1,000 mg by mouth every 6 (six) hours as needed for pain.  amLODipine (NORVASC) 5 MG tablet Take 1 tablet (5 mg total) by mouth daily. 90 tablet 2   apixaban (ELIQUIS) 2.5 MG TABS tablet Take 1 tablet (2.5 mg total) by mouth 2 (two) times daily. 180 tablet 1   clopidogrel (PLAVIX) 75 MG tablet Take 1 tablet (75 mg total) by mouth daily with breakfast. 90 tablet 6   finasteride (PROSCAR) 5 MG tablet Take 5 mg by mouth daily.      fluorouracil (EFUDEX) 5 %  cream Apply topically 2 (two) times daily.     folic acid (FOLVITE) 1 MG tablet Take 1 mg by mouth daily.     hydrALAZINE (APRESOLINE) 25 MG tablet TAKE ONE TABLET BY MOUTH TWICE DAILY WITH FOOD 180 tablet 1   isosorbide mononitrate (IMDUR) 120 MG 24 hr tablet Take 1 tablet (120 mg total) by mouth daily. 90 tablet 2   lisinopril (ZESTRIL) 40 MG tablet TAKE ONE TABLET BY MOUTH DAILY 90 tablet 1   nitroGLYCERIN (NITROSTAT) 0.4 MG SL tablet Place 1 tablet (0.4 mg total) under the tongue every 5 (five) minutes as needed for chest pain. 25 tablet 3   Omega-3 Fatty Acids (FISH OIL) 1000 MG CAPS Take 1,000 mg by mouth daily.     pantoprazole (PROTONIX) 40 MG tablet TAKE ONE TABLET BY MOUTH ONCE DAILY 90 tablet 1   rosuvastatin (CRESTOR) 20 MG tablet TAKE ONE TABLET BY MOUTH DAILY 90 tablet 2   thiamine 100 MG tablet Take 100 mg by mouth daily.     No facility-administered medications prior to visit.     Review of Systems:   Constitutional:   No  weight loss, night sweats,  Fevers, chills, fatigue, or  lassitude.  HEENT:   No headaches,  Difficulty swallowing,  Tooth/dental problems, or  Sore throat,                No sneezing, itching, ear ache, nasal congestion, post nasal drip,   CV:  No chest pain,  Orthopnea, PND, swelling in lower extremities, anasarca, dizziness, palpitations, syncope.   GI  No heartburn, indigestion, abdominal pain, nausea, vomiting, diarrhea, change in bowel habits, loss of appetite, bloody stools.   Resp: No shortness of breath with exertion or at rest.  No excess mucus, no productive cough,  No non-productive cough,  No coughing up of blood.  No change in color of mucus.  No wheezing.  No chest wall deformity  Skin: no rash or lesions.  GU: no dysuria, change in color of urine, no urgency or frequency.  No flank pain, no hematuria   MS:  No joint pain or swelling.  No decreased range of motion.  No back pain.    Physical Exam  BP (!) 148/84 Comment: pt aware  to f/up with PCP  Pulse 74   Ht 5\' 9"  (1.753 m)   Wt 208 lb (94.3 kg)   SpO2 98%   BMI 30.72 kg/m   GEN: A/Ox3; pleasant , NAD, well nourished    HEENT:  /AT,   NOSE-clear, THROAT-clear, no lesions, no postnasal drip or exudate noted.  Class III airway  NECK:  Supple w/ fair ROM; no JVD; normal carotid impulses w/o bruits; no thyromegaly or nodules palpated; no lymphadenopathy.    RESP  Clear  P & A; w/o, wheezes/ rales/ or rhonchi. no accessory muscle use, no dullness to percussion  CARD:  RRR, no m/r/g, no peripheral edema, pulses intact, no cyanosis or clubbing.  GI:   Soft &  nt; nml bowel sounds; no organomegaly or masses detected.   Musco: Warm bil, no deformities or joint swelling noted.   Neuro: alert, no focal deficits noted.    Skin: Warm, no lesions or rashes    Lab Results:  CBC    Component Value Date/Time   WBC 8.3 12/05/2021 0622   RBC 5.00 12/05/2021 0622   HGB 14.7 12/05/2021 0622   HGB 15.3 08/05/2019 0818   HCT 42.9 12/05/2021 0622   HCT 44.4 08/05/2019 0818   PLT 166 12/05/2021 0622   PLT 174 08/05/2019 0818   MCV 85.8 12/05/2021 0622   MCV 89 08/05/2019 0818   MCH 29.4 12/05/2021 0622   MCHC 34.3 12/05/2021 0622   RDW 16.7 (H) 12/05/2021 0622   RDW 13.4 08/05/2019 0818    BMET    Component Value Date/Time   NA 141 11/11/2022 1200   K 4.1 11/11/2022 1200   CL 104 11/11/2022 1200   CO2 24 11/11/2022 1200   GLUCOSE 87 11/11/2022 1200   GLUCOSE 113 (H) 12/05/2021 0622   BUN 17 11/11/2022 1200   CREATININE 1.24 11/11/2022 1200   CALCIUM 9.3 11/11/2022 1200   GFRNONAA >60 12/05/2021 0622   GFRAA 68 06/28/2020 1546    BNP    Component Value Date/Time   BNP 494.2 (H) 12/05/2021 0622    ProBNP No results found for: "PROBNP"  Imaging: No results found.        No data to display          No results found for: "NITRICOXIDE"      Assessment & Plan:   OSA (obstructive sleep apnea) Severe obstructive sleep apnea  with excellent control and compliance on nocturnal CPAP.  Patient is continue on current settings.  Plan  Patient Instructions  Continue on CPAP At bedtime , wear all night long.  Work on healthy weight loss  Do not drive if sleepy  Follow up with cardiology and Primary MD for Hypertension.  Follow up in 6  months and As needed      Essential hypertension Blood pressure is slightly elevated today.  Patient endorses compliance with his medication regimen.  Is asymptomatic.  Advised to follow-up with cardiology and primary care.     Rubye Oaks, NP 11/20/2022

## 2022-11-21 NOTE — Progress Notes (Signed)
Reviewed and agree with assessment/plan.   Annalyssa Thune, MD [facility] 11/21/2022, 7:06 AM Pager:  [phone]  

## 2022-11-21 NOTE — Progress Notes (Signed)
Reviewed and agree with assessment/plan.   Coralyn Helling, MD [facility] 11/21/2022, 7:06 AM Pager:  [phone]

## 2022-11-22 DIAGNOSIS — G5603 Carpal tunnel syndrome, bilateral upper limbs: Secondary | ICD-10-CM | POA: Diagnosis not present

## 2022-12-03 DIAGNOSIS — E041 Nontoxic single thyroid nodule: Secondary | ICD-10-CM | POA: Diagnosis not present

## 2022-12-03 DIAGNOSIS — J690 Pneumonitis due to inhalation of food and vomit: Secondary | ICD-10-CM

## 2022-12-03 DIAGNOSIS — N179 Acute kidney failure, unspecified: Secondary | ICD-10-CM | POA: Diagnosis not present

## 2022-12-19 DIAGNOSIS — G5601 Carpal tunnel syndrome, right upper limb: Secondary | ICD-10-CM | POA: Diagnosis not present

## 2022-12-30 ENCOUNTER — Other Ambulatory Visit: Payer: Self-pay | Admitting: Cardiology

## 2022-12-30 DIAGNOSIS — G5601 Carpal tunnel syndrome, right upper limb: Secondary | ICD-10-CM | POA: Diagnosis not present

## 2023-01-07 ENCOUNTER — Other Ambulatory Visit: Payer: Self-pay | Admitting: Internal Medicine

## 2023-01-23 DIAGNOSIS — G5601 Carpal tunnel syndrome, right upper limb: Secondary | ICD-10-CM | POA: Diagnosis not present

## 2023-02-04 ENCOUNTER — Other Ambulatory Visit: Payer: Self-pay | Admitting: Internal Medicine

## 2023-02-12 ENCOUNTER — Encounter: Payer: Self-pay | Admitting: Internal Medicine

## 2023-02-12 ENCOUNTER — Ambulatory Visit: Payer: Medicare Other | Admitting: Internal Medicine

## 2023-02-12 VITALS — BP 138/86 | HR 72 | Temp 98.3°F | Resp 18 | Ht 70.0 in | Wt 199.8 lb

## 2023-02-12 DIAGNOSIS — I4821 Permanent atrial fibrillation: Secondary | ICD-10-CM

## 2023-02-12 DIAGNOSIS — I10 Essential (primary) hypertension: Secondary | ICD-10-CM | POA: Diagnosis not present

## 2023-02-12 DIAGNOSIS — R809 Proteinuria, unspecified: Secondary | ICD-10-CM | POA: Diagnosis not present

## 2023-02-12 DIAGNOSIS — E1129 Type 2 diabetes mellitus with other diabetic kidney complication: Secondary | ICD-10-CM | POA: Diagnosis not present

## 2023-02-12 NOTE — Progress Notes (Signed)
Office Visit  Subjective   Patient ID: Todd Sampson   DOB: 1934/08/23   Age: 88 y.o.   MRN: [id number]   Chief Complaint Chief Complaint  Patient presents with   Follow-up     History of Present Illness This patient also has moderately severe atrial fibrillation of about years known duration and presents today for a status visit.  He did see [name] on 07/16/2022 where he felt the patient was stable with his A. Fib and CAD.   His atrial fibrillation is controlled with therapy as summarized in the medication list and previous notes.  He is not on any rate or rhythm control medications.  The patient remains on anticoagulation with eliquis 5mg  po BID.  Specifically denied complaints: chest pain, palpitations, and TIAs.  He denies any bleeding or abnormal bruising.  There is no BRBPR or melena.  His CHADSVASC score is a 3.    The patient is a 88 year old Caucasian/White male who returns for a follow-up visit for his T2 diabetes.  This past year, we tried to start him on jardiance for his diabetic macroalbuminuria but he could not afford this medication.  Last year we also tried to get him on farxiga but again this medication was too expensive.  Since the last visit, there have been no problems. He remains on no medications; the diabetes is being managed by dietary intervention alone. He is not walking as much as they would like. He specifically denies unexplained abdominal pain, nausea or vomiting and documented hypoglycemia. He does not routinely check blood sugars. He came in fasting today in anticipation of lab work.  His last HgBA1c was done 3 months ago and was 6.4%. He has no long term complications of diabetes- no diabetic retinopathy, neuropathy or nephropathy.  We noted he had some macroalbuminuria on his yearly exam in 01/2022.  He remains on an ACE inhibitor.  His last yearly dilated eye exam was on 05/27/2022 and this showed no evidence of diabetic retinopathy.   The patient is a 88 year  old Caucasian/White male who presents for a follow-up evaluation of hypertension.  On his last visit, he was not yet back on his hydrochlorothiazide as he had recent gastroenteritis with dehydration and ARF.   We rechecked his labs and his creatinine improved and we wanted him to restart his hydrochlorothiazide but over the interim, he never did start it.   This past year, his BP was elevated and I started him on bisoprolol.  However, he stopped this medication due to his HR going down to the 40's and he was having fatigue.  He only took bisoprolol for 1 week.   The patient has been checking his blood pressure at home.  He states his systolic blood pressure at home just a few times and it has run in the 130-150's.  The patient's current medications include: amlodipine 5 mg daily, hydralazine 25mg  BID, Imdur 120 mg daily, and lisinopril 40 mg daily. The patient has been tolerating his medications well. The patient denies any headache, visual changes, dizziness, lightheadness, chest pain, shortness of breath, weakness/numbness, and edema.  He reports there have been no other symptoms noted.  He is still using compression hose for his history of venous insufficiency.        Past Medical History Past Medical History:  Diagnosis Date   Anemia    Barrett's esophagus    BMI 32.0-32.9,adult 11/09/2019   BPH (benign prostatic hyperplasia) 10/17/2021  Calculus of gallbladder with chronic cholecystitis without obstruction 11/09/2019   Coronary artery disease involving native coronary artery of native heart with unstable angina pectoris (HCC) 12/04/2021   Coronary artery disease of native artery of native heart with stable angina pectoris (HCC) 06/06/2016   Diet-controlled diabetes mellitus (HCC) 06/06/2016   Dyslipidemia (high LDL; low HDL) 06/06/2016   Essential hypertension 06/06/2016   GERD (gastroesophageal reflux disease) 10/17/2021   Guillain-Barre disease (HCC)    H/O: hemorrhoidectomy    Heart  disease    Hx of CABG 10/02/2017   Hypercholesteremia 10/17/2021   Hyperlipidemia    Hypertension    Microalbuminuria due to type 2 diabetes mellitus (HCC) 05/08/2022   Mitral regurgitation 07/26/2019   Non-intractable vomiting with nausea 11/09/2019   Obesity (BMI 30.0-34.9) 12/13/2021   Permanent atrial fibrillation (HCC) 06/06/2016   Postoperative examination 12/13/2019   Preoperative cardiovascular examination 10/02/2017   Primary osteoarthritis of left hip 04/20/2018   Added automatically from request for surgery [id number]  Formatting of this note might be different from the original. Added automatically from request for surgery [id number]   Prostate cancer [facility])    Salivary gland calculi    Snoring 03/26/2022   Special screening examination for viral disease 11/09/2019   Toe pain, bilateral 09/10/2019   Type 2 diabetes, diet controlled (HCC) 10/17/2021   Umbilical hernia without obstruction and without gangrene 11/09/2019   Unilateral primary osteoarthritis, right hip 04/30/2021     Allergies Allergies  Allergen Reactions   Penicillins Other (See Comments)    Childhood reaction--pt does not remember     Medications  Current Outpatient Medications:    acetaminophen (TYLENOL) 500 MG tablet, Take 500-1,000 mg by mouth every 6 (six) hours as needed for pain., Disp: , Rfl:    amLODipine (NORVASC) 5 MG tablet, Take 1 tablet (5 mg total) by mouth daily., Disp: 90 tablet, Rfl: 2   apixaban (ELIQUIS) 2.5 MG TABS tablet, Take 1 tablet (2.5 mg total) by mouth 2 (two) times daily., Disp: 180 tablet, Rfl: 1   clopidogrel (PLAVIX) 75 MG tablet, Take 1 tablet (75 mg total) by mouth daily with breakfast., Disp: 90 tablet, Rfl: 6   finasteride (PROSCAR) 5 MG tablet, Take 5 mg by mouth daily. , Disp: , Rfl:    fluorouracil (EFUDEX) 5 % cream, Apply topically 2 (two) times daily., Disp: , Rfl:    folic acid (FOLVITE) 1 MG tablet, Take 1 mg by mouth daily., Disp: , Rfl:    hydrALAZINE  (APRESOLINE) 25 MG tablet, TAKE ONE TABLET BY MOUTH TWICE DAILY WITH FOOD, Disp: 180 tablet, Rfl: 1   isosorbide mononitrate (IMDUR) 120 MG 24 hr tablet, Take 1 tablet (120 mg total) by mouth daily., Disp: 90 tablet, Rfl: 2   lisinopril (ZESTRIL) 40 MG tablet, TAKE ONE TABLET BY MOUTH DAILY, Disp: 90 tablet, Rfl: 1   nitroGLYCERIN (NITROSTAT) 0.4 MG SL tablet, Place 1 tablet (0.4 mg total) under the tongue every 5 (five) minutes as needed for chest pain., Disp: 25 tablet, Rfl: 3   Omega-3 Fatty Acids (FISH OIL) 1000 MG CAPS, Take 1,000 mg by mouth daily., Disp: , Rfl:    pantoprazole (PROTONIX) 40 MG tablet, TAKE ONE TABLET BY MOUTH ONCE DAILY, Disp: 90 tablet, Rfl: 1   rosuvastatin (CRESTOR) 20 MG tablet, TAKE ONE TABLET BY MOUTH DAILY, Disp: 90 tablet, Rfl: 2   thiamine 100 MG tablet, Take 100 mg by mouth daily., Disp: , Rfl:    Review of Systems Review of  Systems  Constitutional:  Negative for chills and fever.  Eyes:  Negative for blurred vision and double vision.  Respiratory:  Negative for cough, hemoptysis and shortness of breath.   Cardiovascular:  Negative for chest pain, palpitations and leg swelling.  Gastrointestinal:  Negative for abdominal pain, blood in stool, constipation, diarrhea, melena, nausea and vomiting.  Genitourinary:  Negative for frequency and hematuria.  Musculoskeletal:  Negative for myalgias.  Skin:  Negative for itching and rash.  Neurological:  Negative for dizziness, weakness and headaches.  Endo/Heme/Allergies:  Negative for polydipsia. Does not bruise/bleed easily.       Objective:    Vitals BP 138/86   Pulse 72   Temp 98.3 F (36.8 C)   Resp 18   Ht 5\' 10"  (1.778 m)   Wt 199 lb 12.8 oz (90.6 kg)   SpO2 97%   BMI 28.67 kg/m    Physical Examination Physical Exam Constitutional:      Appearance: Normal appearance. He is not ill-appearing.  Cardiovascular:     Rate and Rhythm: Normal rate and regular rhythm.     Pulses: Normal pulses.      Heart sounds: No murmur heard.    No friction rub. No gallop.  Pulmonary:     Effort: Pulmonary effort is normal. No respiratory distress.     Breath sounds: No wheezing, rhonchi or rales.  Abdominal:     General: Abdomen is flat. Bowel sounds are normal. There is no distension.     Palpations: Abdomen is soft.     Tenderness: There is no abdominal tenderness.  Musculoskeletal:     Right lower leg: No edema.     Left lower leg: No edema.  Skin:    General: Skin is warm and dry.     Findings: No rash.  Neurological:     General: No focal deficit present.     Mental Status: He is alert and oriented to person, place, and time.  Psychiatric:        Mood and Affect: Mood normal.        Behavior: Behavior normal.        Assessment & Plan:   Permanent atrial fibrillation [facility]) He did see cardiology and his A. Fib is stable.  His CHAD2VASC score is a 3 where he is on anticoagulation.  Essential hypertension He never did start back his hydrochlorothiazide but his BP is doing well.  We will hold off on this medication and see him back in 3 months for a yearly exam and see what his BP is doing at that time.  Microalbuminuria due to type 2 diabetes mellitus (HCC) He has microalbuminuria due to his T2 diabetes.  He is on an ACE-I but his insurance would not cover [place].  I asked him to avoid NSAIDS.  We will check his urine studies and HgBA1c on his next visit.    Return in about 3 months (around 05/15/2023) for annual.   Crist Fat, MD

## 2023-02-12 NOTE — Assessment & Plan Note (Signed)
He has microalbuminuria due to his T2 diabetes.  He is on an ACE-I but his insurance would not cover [place].  I asked him to avoid NSAIDS.  We will check his urine studies and HgBA1c on his next visit.

## 2023-02-12 NOTE — Assessment & Plan Note (Signed)
He never did start back his hydrochlorothiazide but his BP is doing well.  We will hold off on this medication and see him back in 3 months for a yearly exam and see what his BP is doing at that time.

## 2023-02-12 NOTE — Assessment & Plan Note (Signed)
He did see cardiology and his A. Fib is stable.  His CHAD2VASC score is a 3 where he is on anticoagulation.

## 2023-03-04 DIAGNOSIS — G5601 Carpal tunnel syndrome, right upper limb: Secondary | ICD-10-CM | POA: Diagnosis not present

## 2023-03-05 DIAGNOSIS — L578 Other skin changes due to chronic exposure to nonionizing radiation: Secondary | ICD-10-CM | POA: Diagnosis not present

## 2023-03-05 DIAGNOSIS — L570 Actinic keratosis: Secondary | ICD-10-CM | POA: Diagnosis not present

## 2023-03-05 DIAGNOSIS — C44612 Basal cell carcinoma of skin of right upper limb, including shoulder: Secondary | ICD-10-CM | POA: Diagnosis not present

## 2023-03-06 ENCOUNTER — Other Ambulatory Visit: Payer: Self-pay | Admitting: Cardiology

## 2023-03-19 ENCOUNTER — Encounter: Payer: Self-pay | Admitting: Adult Health

## 2023-03-31 DIAGNOSIS — R351 Nocturia: Secondary | ICD-10-CM | POA: Diagnosis not present

## 2023-03-31 DIAGNOSIS — C61 Malignant neoplasm of prostate: Secondary | ICD-10-CM | POA: Diagnosis not present

## 2023-05-02 ENCOUNTER — Encounter: Payer: Self-pay | Admitting: Adult Health

## 2023-05-14 ENCOUNTER — Encounter: Payer: Self-pay | Admitting: Internal Medicine

## 2023-05-14 ENCOUNTER — Ambulatory Visit: Payer: Medicare Other | Admitting: Internal Medicine

## 2023-05-14 VITALS — BP 144/82 | HR 77 | Temp 98.4°F | Resp 17 | Ht 69.0 in | Wt 202.0 lb

## 2023-05-14 DIAGNOSIS — M8000XA Age-related osteoporosis with current pathological fracture, unspecified site, initial encounter for fracture: Secondary | ICD-10-CM

## 2023-05-14 DIAGNOSIS — N400 Enlarged prostate without lower urinary tract symptoms: Secondary | ICD-10-CM

## 2023-05-14 DIAGNOSIS — G4733 Obstructive sleep apnea (adult) (pediatric): Secondary | ICD-10-CM

## 2023-05-14 DIAGNOSIS — E1129 Type 2 diabetes mellitus with other diabetic kidney complication: Secondary | ICD-10-CM

## 2023-05-14 DIAGNOSIS — E119 Type 2 diabetes mellitus without complications: Secondary | ICD-10-CM | POA: Diagnosis not present

## 2023-05-14 DIAGNOSIS — I872 Venous insufficiency (chronic) (peripheral): Secondary | ICD-10-CM

## 2023-05-14 DIAGNOSIS — S32020S Wedge compression fracture of second lumbar vertebra, sequela: Secondary | ICD-10-CM

## 2023-05-14 DIAGNOSIS — Z23 Encounter for immunization: Secondary | ICD-10-CM

## 2023-05-14 DIAGNOSIS — I10 Essential (primary) hypertension: Secondary | ICD-10-CM

## 2023-05-14 DIAGNOSIS — Z87310 Personal history of (healed) osteoporosis fracture: Secondary | ICD-10-CM

## 2023-05-14 DIAGNOSIS — I25810 Atherosclerosis of coronary artery bypass graft(s) without angina pectoris: Secondary | ICD-10-CM

## 2023-05-14 DIAGNOSIS — Z6829 Body mass index (BMI) 29.0-29.9, adult: Secondary | ICD-10-CM

## 2023-05-14 DIAGNOSIS — Z951 Presence of aortocoronary bypass graft: Secondary | ICD-10-CM

## 2023-05-14 DIAGNOSIS — E7800 Pure hypercholesterolemia, unspecified: Secondary | ICD-10-CM

## 2023-05-14 DIAGNOSIS — K219 Gastro-esophageal reflux disease without esophagitis: Secondary | ICD-10-CM

## 2023-05-14 DIAGNOSIS — I4821 Permanent atrial fibrillation: Secondary | ICD-10-CM

## 2023-05-14 DIAGNOSIS — M8000XS Age-related osteoporosis with current pathological fracture, unspecified site, sequela: Secondary | ICD-10-CM

## 2023-05-14 DIAGNOSIS — K22719 Barrett's esophagus with dysplasia, unspecified: Secondary | ICD-10-CM

## 2023-05-14 DIAGNOSIS — C61 Malignant neoplasm of prostate: Secondary | ICD-10-CM

## 2023-05-14 DIAGNOSIS — S32020A Wedge compression fracture of second lumbar vertebra, initial encounter for closed fracture: Secondary | ICD-10-CM

## 2023-05-14 HISTORY — DX: Body mass index (BMI) 29.0-29.9, adult: Z68.29

## 2023-05-14 HISTORY — DX: Atherosclerosis of coronary artery bypass graft(s) without angina pectoris: I25.810

## 2023-05-14 HISTORY — DX: Wedge compression fracture of second lumbar vertebra, initial encounter for closed fracture: S32.020A

## 2023-05-14 HISTORY — DX: Age-related osteoporosis with current pathological fracture, unspecified site, initial encounter for fracture: M80.00XA

## 2023-05-14 HISTORY — DX: Pure hypercholesterolemia, unspecified: E78.00

## 2023-05-14 HISTORY — DX: Venous insufficiency (chronic) (peripheral): I87.2

## 2023-05-14 MED ORDER — HYDROCHLOROTHIAZIDE 25 MG PO TABS: 25.0000 mg | ORAL_TABLET | Freq: Every day | ORAL | 3 refills | Status: DC

## 2023-05-14 NOTE — Assessment & Plan Note (Signed)
Plan as above.  

## 2023-05-14 NOTE — Assessment & Plan Note (Signed)
He has active prostate cancer but is under surveillance by urology.

## 2023-05-14 NOTE — Assessment & Plan Note (Signed)
We stopped him off his hydrochlorothiazide and his BP is still a little elevated.  I am going to restart him back on hydrochlorothiazide.

## 2023-05-14 NOTE — Assessment & Plan Note (Signed)
This is followed by urology.  

## 2023-05-14 NOTE — Assessment & Plan Note (Signed)
Again, his insurance copay was too expensive for farxiga and jardiance.  He remains on an ACE-I at this time.  We will check a urine albumin/creatinine ratio today.

## 2023-05-14 NOTE — Assessment & Plan Note (Signed)
He does not use a diuretic and he states that compression hose do not help.  He is currently not having any swelling.

## 2023-05-14 NOTE — Assessment & Plan Note (Signed)
Continue his PPI and he will need a repeat EGD in 2025.

## 2023-05-14 NOTE — Assessment & Plan Note (Signed)
Continue his PPI.

## 2023-05-14 NOTE — Assessment & Plan Note (Signed)
As above.

## 2023-05-14 NOTE — Assessment & Plan Note (Signed)
He fell off prolia during the COVID-19 pandemic.  I am going to repeat a bone density on him at this time.

## 2023-05-14 NOTE — Assessment & Plan Note (Signed)
He is pretty active at his home with taking care of his wife and he also does wood working in his shop almost daily.

## 2023-05-14 NOTE — Assessment & Plan Note (Addendum)
The patient has CAD without any signs/symptoms of angina.  I have reviewed his medications.  We will continue with risk factor modifcation.  He cannot be on a beta blocker due to recent history of bradycardia.  I tried to start him on jardiance for his heart as well as his urine microalbuminuria but he could not afford it.  We will continue with risk factor modification.  He remains on plavix 75mg  daily.

## 2023-05-14 NOTE — Assessment & Plan Note (Signed)
He is compliant with his CPAP at home.

## 2023-05-14 NOTE — Assessment & Plan Note (Signed)
He is not on any rate control meds but his A. Fib is doing well.  We will continue him on eliquis at this time.

## 2023-05-14 NOTE — Progress Notes (Unsigned)
Preventive Screening-Counseling & Management     Todd Sampson is a 88 y.o. male who presents for Medicare Annual/Subsequent preventive examination.  Todd Sampson is a 88 year old Caucasian/White male who presents for his annual wellness exam. He is due for the following health maintenance studies: visual exam and screening labs. This patient's past medical history Asthma, Atrial Fibrillation, Barrett's esophagus, Benign Essential Hypertension, CAD, Diabetes Mellitus, Type II, Gullain Barre Syndrome, Hyperlipidemia, Osteoarthritis, Osteoporosis, and Prostate Neoplasm, Malignant.   His last dilated eye exam was performed on 05/27/2022 by [name] which showed no evidence of diabetic retinopathy. He denies any problems with his vision today. He has a history of Barrett's esophagus and had a repeat EGD performed in 11/14/2021 which showed barrett's pathology. They are doing repeat EGD every 2 years. He remains on protonix and denies any reflux or dysphagia at this time.  His last colonoscopy done in 05/2016 and this showed one adenomatous polyp but they do not recommend any further colonoscopies due to his age. His previous colonoscopy in 04/2011 showed one polyp which was removed. He is also followed by [name] for his prostate cancer where he last saw the urologist in 03/31/2023 and they are following his PSA labs and the patient states everything is stable.   He was diagnosed with prostate cancer in 04/2011 with Stage I cancer. He did not receive any therapy but is on survelliance. However, his last prostate biopsy on 12/2012 was negative.  He is not exercising as much.  He does get yearly flu vaccines. He has had a pneumovax23 vaccine since the age of 38. He has had a shingles vaccine with zostavax. He had a prevnar 13 vaccine in 2016. He has had 4 COVID-19 vaccines including 2 boosters. He did contract COVID-19 in in 03/2020 where he received Regeneron. He denies any long term effects from  COVID-19. There is no depression, anxiety or memory loss. He is not on an ASA but he is on Plavix 75mg  daily.   This patient also has moderately severe atrial fibrillation of about years known duration and presents today for a status visit.  He did see [name] on 07/16/2022 where he felt the patient was stable with his A. Fib and CAD.  His atrial fibrillation is controlled with therapy as summarized in the medication list and previous notes.  He is not on any rate or rhythm control medications.  The patient remains on anticoagulation with eliquis 5mg  po BID.  Specifically denied complaints: chest pain, palpitations, generalized weakness, SOB, syncope or TIAs.  He denies any bleeding or abnormal bruising.  There is no BRBPR or melena.  His CHADSVASC score is a 3.    The patient is a 88 year old Caucasian/White male who returns for a follow-up visit for his T2 diabetes.  This past year, we tried to start him on jardiance for his diabetic macroalbuminuria but he could not afford this medication.  Last year we also tried to get him on farxiga but again this medication was too expensive.  Since the last visit, there have been no problems. He remains on no medications; the diabetes is being managed by dietary intervention alone. He is not walking as much as they would like. He specifically denies unexplained abdominal pain, nausea or vomiting and documented hypoglycemia. He does not routinely check blood sugars. He came in fasting today in anticipation of lab work.  His last HgBA1c was done 6 months ago and was 6.4%.  He has no long term complications of diabetes- no diabetic retinopathy, neuropathy or nephropathy.  We noted he had some macroalbuminuria on his yearly exam in 01/2022.  He remains on an ACE inhibitor.  His last yearly dilated eye exam was on 05/27/2022 and this showed no evidence of diabetic retinopathy.   The patient is a 88 year old Caucasian/White male who presents for a follow-up evaluation of  hypertension.  This past year, he was stopped off his hydrochlorothiazide as he had gastroenteritis with dehydration and ARF.   We rechecked his labs and his creatinine improved and he was restarted hydrochlorothiazide but he never did start it.  This past year, his BP was elevated and I started him on bisoprolol.  However, he stopped this medication due to his HR going down to the 40's and he was having fatigue.  He only took bisoprolol for 1 week.   The patient has been checking his blood pressure at home.  He states his systolic blood pressure at home just a few times and it has run in the 130-140's.  The patient's current medications include: amlodipine 5 mg daily, hydralazine 25mg  BID, Imdur 120 mg daily, and lisinopril 40 mg daily. The patient has been tolerating his medications well. The patient denies any headache, visual changes, dizziness, lightheadness, chest pain, shortness of breath, weakness/numbness, and edema.  He reports there have been no other symptoms noted.  He is still using compression hose for his history of venous insufficiency.  [name] was hospitalized in 10/2022 with gastroenteritis.  He developed nausea, vomiting and diarrhea and presented to the ER with SOB.  He was noted to be satting at 82% on RA where he was admitted to the hospital for acute hypoxic respiratory failure due to aspiration pneumonia as well as ARF due to his acute gastroenteritis.  He was placed on supplemental oxygen and started on antiobiotics and sent home on a course of levaquin.  His initial creatinine was 1.7 and he was give fluids.  His ACE-I and HCTZ were stopped but his lisinopril was restarted the day after discharge with instructions to hold HCTZ until followup with myself.  They cut his eliquis dose in half due to the ARF as well.  Today,  this is all resolved.   The patient also has a history of severe OSA.  He had a sleep study done on 07/15/2022.  This showed severe OSA and they are recommending  therapeutic CPAP titration.  He did perform this and saw pulmonary in 08/09/2022 and they have set him up for an autoCPAP where he remains compliant with this.  When he was in the hospital in 11/2021, he was noted to have some complications of bradycardia and hypoxia when he did sleep at night where they kept him for an extra day and want him sent for a sleep study as an outpatient.  The patient is a 88 yo male who returns for followup of his CAD.  He was hospitalized in 12/06/2021 due to chest pain at that time.  He has a history of severe CAD with history of CABG x3 in 2000 where he presented to the ER with chest pain.  They performed an ECHO on him on 12/05/2021 and this showed a LVEF of 55-60% without wall motion abnormalities but he had mild LVH and they could not assess his diastolic function.  He had mildly reduce RV systolic function and he had bilateral atrial moderate enlargement.  There was borderline dilatation of the  ascending aorta with a measurement of 3.6cm. The patient was transferred to [facility] where he underwent a LHC that showed lesions as described:  mid RCA 50%, RV branch 75%, proximal RCA 40%, mid LAD 50%, 2nd diagonal 75%, distal LAD 70%, mid CFx 100%, and SVG to OM with distal graft lesion with 95%.  They did place a DES in his distal SVG to OM.  He had some complications of bradycardia and hypoxia when he did sleep at night where they kept him for an extra day and want him sent for a sleep study as an outpatient.  They patient was discharged home with plans for Plavix for 6 months and continued Eliquis for life.  They did change his lovastatin to crestor. He did complete a course of cardiac rehab.  He did see [name] in followup on 01/14/2022 and again in 07/2022 and he felt he was stable.  He sees cardiology every 6 months.  He did go for a right total hip in 2022 where he had a nuclear stress stress on 03/28/2021 that was low risk and showed no evidence of ischemia with a LVEF 57%.  In  05/2016, he was having some chest tightness.  They sent him for a cardiolite stress test and the patient tells me that this was normal. He did have a CABG done in 2000 as described. They did perform a nuclear stress test on him in 10/2012 and this showed no evidence of ischemia with a normal EF 67%.  They repeated a treadmill sestamibi nuclear stress in 05/2015 and this showed a normal LVEF of 59% and was a negative test.  He underwent a myocardial perfusion test in 09/2017 and this showed an EF 48% with post CABD septal wall hypokinesis.  They felt this study was normal and he had no evidence of ischemia.  He also underwent an ECHO in 09/2017 which showed his EF 55% with RA and LA dilatation and moderate TR.  His CAD is controlled with therapy as summarized in the medication list and previous notes. The patient has the following comorbid condition(s): atrial fibrillation. He has no baseline symptoms of CAD. He has the following modifiable risk factor(s): HTN, DM, and hyperlipidemia.  He denies any chest pain, palpitations, orthopnea, exertional dyspnea, and syncope or recurrent peripheral edema.    Bard L. Mabery returns today for routine followup on his cholesterol. Overall, he states he is doing well and is without any complaints or problems at this time. He specifically denies abdominal pain, nausea, vomiting, diarrhea, myalgias, and fatigue. He remains on dietary management which he is not very compliant with but he is also on the following cholesterol lowering medications: Omega 3 fish oil and crestor 20 mg qhs. He is fasting in anticipation for labs today.   The patient also has a history of chronic venous insufficiency.  He has used Lasix and compression hose in the past which really do not help.   Again, the patient underwent great saphenous vein ablation in 2016 on both legs by vascular surgery.  The patient had developed bilateral venous stasis ulcers where he was being followed by Podiatry and vascular  surgery.  Both venous ulcers which were located near his medial malleolus on both sides have completely healed and resolved after he had this surgery.  He tells me that for years he has had some mild burning on the bottom of both feet but he has some numbness on his right foot.   He has not  had any recent problems with leg swelling though.  Mr. Barbie had a right total hip arthroplasty under spinal anesthesia on 04/30/2021.  He did followup with ortho on 09/2021 and they felt he was stable and wanted him to continue to exercise.  Again, he was being followed bu ortho and has failed conservative management of OA of his right hip.  He has been having pain for the last 2 years but this has progressively worsened over the last year.  His pain was effecting his walking and his ADL.  Mr. Grun underwent a left total hip arthroplasty on 05/25/2018.  He tolerated this procedure well.   He has a history of osteoporosis and was receiving prolia injections.  He has a history of L2 vertebral compression fracture in the past. His last surgery was in 09/2019 where he had acute cholecystitis.  He was sent to the hospital where he underwent laparscopic cholecystectomy where he recovered well and had no complications with his surgery.  Mr. Blandin also has a history of osteoarthritis which affects his thumbs, left and right shoulder as well as his left hip and his right knee.  We have performed some routine lab testing in the past and his RA factor was elevated and I sent him to rheumatology.  He was diagnosed with osteoarthritis.  He uses voltaren cream and tylenol as needed which controls this intermittent pain.  He did [employer] in [place] and he has not had any sequlae from this.      The patient is a 88 year old Caucasian/White male who presents for followup of his osteoporosis. The patient had a fall in 11/2015 and was found to have a L2 vertebral compression fracture. He underwent a L2 kyphoplasty where he  tolerated this and denies any back pain at this time. We sent him for bone density and he was found to have osteoporosis. He has a history of spinal fractures. He denies a family history of osteoporosis. Current risk factors: diabetes mellitus. He denies the following: smoking, high caffeine intake, alcohol consumption of more that 7 ounces per week, daily prednisone use, hyperthyroidism, bowel resection, and gastric resection. He states he does not exercise routinely. A bone mineral density study was performed on 12/2015 and his tscore was -2.1. I did set him up for prolia injection for his osteoporosis in the setting of a vertebral compression fracture. He had his first prolia injection in 12/2015 and this was repeated in 07/2016 and again in 01/2017 with his last injection in 01/2019.  He did not receive prolia injections during the COVID pandemic.        Mr. Pignone also has a history of iron defiency anemia and barrett's esophagus where he sees Dr. Charm Barges regularly over the last 20 or so years.  He saw Dr. Charm Barges in 2014 where we sent him back for evaluation for occult GIB.  They did not recommend anything new and ago he felt that the GI bleed is from his duodenum where a capsule endoscopy was done in 2012 and this showed minute bleeding points in the duodenum.  He had a colonoscopy in 05/2016 where they found one polyp which was removed.  His last EGD was done in 05/2019 that showed continued Barrett's. He recommended continued PPI and repeat EGD in 2 years. He has had esophageal dilitation done in 07/2011 but denies any dysphagia now.      The patient also presents for followup of his mild persistent asthma which was diagnosed  in 05/2014.  He tells me that he is not taking any pulmonary inhalers.  Again, Pulmonary questioned whether he may have had developing COPD.  The patient has been placed on BREO but was not compliant with this due to cost and him not having much symptoms of his asthma.   See PMH for  summary of pulmonary history and lab reports for status of any previous PFTs: Asthma and CAD. Comorbid conditions : none.. He has the following baseline symptoms / status issues: no baseline SOB or DOE. The patient's condition is controlled by medications as summarized in the medication list on the face sheet. He has the following modifiable risk factors: sedentary lifestyle. Specifically denied complaints: worsening exertional dyspnea, increased wheezing, pleuritic chest pain, hemoptysis, and fever. Interval studies: he had a CXR in 05/2014 and this showed no evidence of COPD.             Are there smokers in your home (other than you)? No  Risk Factors Current exercise habits:  as above   Dietary issues discussed: none   Depression Screen (Note: if answer to either of the following is "Yes", a more complete depression screening is indicated)   Over the past two weeks, have you felt down, depressed or hopeless? No  Over the past two weeks, have you felt little interest or pleasure in doing things? No  Have you lost interest or pleasure in daily life? No  Do you often feel hopeless? No  Do you cry easily over simple problems? No  Activities of Daily Living In your present state of health, do you have any difficulty performing the following activities?:  Driving? No Managing money?  No Feeding yourself? No Getting from bed to chair? No Climbing a flight of stairs? No Preparing food and eating?: No Bathing or showering? No Getting dressed: No Getting to the toilet? No Using the toilet:No Moving around from place to place: No In the past year have you fallen or had a near fall?:No   Are you sexually active?  No  Do you have more than one partner?  No  Hearing Difficulties: Yes-  wears hearing aids Do you often ask people to speak up or repeat themselves? Yes Do you experience ringing or noises in your ears? No Do you have difficulty understanding soft or whispered voices?  Yes   Do you feel that you have a problem with memory? No  Do you often misplace items? No  Do you feel safe at home?  Yes  Cognitive Testing  Alert? Yes  Normal Appearance?Yes  Oriented to person? Yes  Place? Yes   Time? Yes  Recall of three objects?  Yes  Can perform simple calculations? Yes  Displays appropriate judgment?Yes  Can read the correct time from a watch face?Yes  Fall Risk Prevention  Any stairs in or around the home? Yes  If so, are there any without handrails? Yes  Home free of loose throw rugs in walkways, pet beds, electrical cords, etc? Yes  Adequate lighting in your home to reduce risk of falls? Yes  Use of a cane, walker or w/c? No    Time Up and Go  Was the test performed? Yes .  Length of time to ambulate 10 feet: 10 sec.   Gait steady and fast without use of assistive device    Advanced Directives have been discussed with the patient? Yes   List the Names of Other Physician/Practitioners you currently use: Patient Care Team: Zenaida Niece  Domenic Schwab, MD as PCP - General (Internal Medicine) Revankar, Aundra Dubin, MD as PCP - Cardiology (Cardiology) Hilda Lias, MD as Referring Physician (Orthopedic Surgery)    Past Medical History:  Diagnosis Date   Anemia    Barrett's esophagus    BMI 32.0-32.9,adult 11/09/2019   BPH (benign prostatic hyperplasia) 10/17/2021   Calculus of gallbladder with chronic cholecystitis without obstruction 11/09/2019   Coronary artery disease involving native coronary artery of native heart with unstable angina pectoris (HCC) 12/04/2021   Coronary artery disease of native artery of native heart with stable angina pectoris (HCC) 06/06/2016   Diet-controlled diabetes mellitus (HCC) 06/06/2016   Dyslipidemia (high LDL; low HDL) 06/06/2016   Essential hypertension 06/06/2016   GERD (gastroesophageal reflux disease) 10/17/2021   Guillain-Barre disease (HCC)    H/O: hemorrhoidectomy    Heart disease    Hx of CABG 10/02/2017    Hypercholesteremia 10/17/2021   Hyperlipidemia    Hypertension    Microalbuminuria due to type 2 diabetes mellitus (HCC) 05/08/2022   Mitral regurgitation 07/26/2019   Non-intractable vomiting with nausea 11/09/2019   Obesity (BMI 30.0-34.9) 12/13/2021   Permanent atrial fibrillation (HCC) 06/06/2016   Postoperative examination 12/13/2019   Preoperative cardiovascular examination 10/02/2017   Primary osteoarthritis of left hip 04/20/2018   Added automatically from request for surgery [phone]  Formatting of this note might be different from the original. Added automatically from request for surgery [id number]   Prostate cancer [facility])    Salivary gland calculi    Snoring 03/26/2022   Special screening examination for viral disease 11/09/2019   Toe pain, bilateral 09/10/2019   Type 2 diabetes, diet controlled (HCC) 10/17/2021   Umbilical hernia without obstruction and without gangrene 11/09/2019   Unilateral primary osteoarthritis, right hip 04/30/2021    Past Surgical History:  Procedure Laterality Date   APPENDECTOMY     CARDIAC BYPASS     CORONARY STENT INTERVENTION N/A 12/04/2021   Procedure: CORONARY STENT INTERVENTION;  Surgeon: Corky Crafts, MD;  Location: [facility] INVASIVE CV LAB;  Service: Cardiovascular;  Laterality: N/A;   LEFT HEART CATH AND CORS/GRAFTS ANGIOGRAPHY N/A 12/04/2021   Procedure: LEFT HEART CATH AND CORS/GRAFTS ANGIOGRAPHY;  Surgeon: Corky Crafts, MD;  Location: [facility] INVASIVE CV LAB;  Service: Cardiovascular;  Laterality: N/A;   REPLACEMENT TOTAL KNEE BILATERAL        Current Medications  Current Outpatient Medications  Medication Sig Dispense Refill   acetaminophen (TYLENOL) 500 MG tablet Take 500-1,000 mg by mouth every 6 (six) hours as needed for pain.     amLODipine (NORVASC) 5 MG tablet Take 1 tablet (5 mg total) by mouth daily. 90 tablet 2   apixaban (ELIQUIS) 2.5 MG TABS tablet Take 1 tablet (2.5 mg total) by mouth 2 (two) times daily. 180 tablet 1    clopidogrel (PLAVIX) 75 MG tablet Take 1 tablet (75 mg total) by mouth daily with breakfast. 90 tablet 0   finasteride (PROSCAR) 5 MG tablet Take 5 mg by mouth daily.      fluorouracil (EFUDEX) 5 % cream Apply topically 2 (two) times daily.     folic acid (FOLVITE) 1 MG tablet Take 1 mg by mouth daily.     hydrALAZINE (APRESOLINE) 25 MG tablet TAKE ONE TABLET BY MOUTH TWICE DAILY WITH FOOD 180 tablet 1   isosorbide mononitrate (IMDUR) 120 MG 24 hr tablet Take 1 tablet (120 mg total) by mouth daily. 90 tablet 2   lisinopril (ZESTRIL) 40 MG tablet TAKE  ONE TABLET BY MOUTH DAILY 90 tablet 1   nitroGLYCERIN (NITROSTAT) 0.4 MG SL tablet Place 1 tablet (0.4 mg total) under the tongue every 5 (five) minutes as needed for chest pain. 25 tablet 3   Omega-3 Fatty Acids (FISH OIL) 1000 MG CAPS Take 1,000 mg by mouth daily.     pantoprazole (PROTONIX) 40 MG tablet TAKE ONE TABLET BY MOUTH ONCE DAILY 90 tablet 1   rosuvastatin (CRESTOR) 20 MG tablet TAKE ONE TABLET BY MOUTH DAILY 90 tablet 2   thiamine 100 MG tablet Take 100 mg by mouth daily.     No current facility-administered medications for this visit.    Allergies Penicillins   Social History Social History   Tobacco Use   Smoking status: Former   Smokeless tobacco: Never  Substance Use Topics   Alcohol use: No     Review of Systems Review of Systems  Constitutional:  Negative for chills, fever and malaise/fatigue.  Eyes:  Negative for blurred vision and double vision.  Respiratory:  Negative for cough, hemoptysis, shortness of breath and wheezing.   Cardiovascular:  Negative for chest pain, palpitations and leg swelling.  Gastrointestinal:  Negative for abdominal pain, blood in stool, constipation, diarrhea, heartburn, melena, nausea and vomiting.  Genitourinary:  Negative for frequency and hematuria.  Musculoskeletal:  Negative for back pain and myalgias.  Skin:  Negative for itching and rash.  Neurological:  Positive for weakness.  Negative for dizziness and headaches.  Endo/Heme/Allergies:  Negative for polydipsia.  Psychiatric/Behavioral:  Negative for depression. The patient is not nervous/anxious.      Physical Exam:      Body mass index is 29.83 kg/m. BP (!) 144/82   Pulse 77   Temp 98.4 F (36.9 C)   Resp 17   Ht 5\' 9"  (1.753 m)   Wt 202 lb (91.6 kg)   SpO2 97%   BMI 29.83 kg/m   Physical Exam Constitutional:      Appearance: Normal appearance. He is not ill-appearing.  HENT:     Head: Normocephalic and atraumatic.     Right Ear: Tympanic membrane, ear canal and external ear normal.     Left Ear: Tympanic membrane, ear canal and external ear normal.     Nose: Nose normal. No congestion or rhinorrhea.     Mouth/Throat:     Mouth: Mucous membranes are dry.     Pharynx: Oropharynx is clear. No oropharyngeal exudate or posterior oropharyngeal erythema.  Eyes:     General: No scleral icterus.    Conjunctiva/sclera: Conjunctivae normal.     Pupils: Pupils are equal, round, and reactive to light.  Neck:     Vascular: No carotid bruit.  Cardiovascular:     Rate and Rhythm: Normal rate and regular rhythm.     Pulses: Normal pulses.     Heart sounds: No murmur heard.    No friction rub. No gallop.  Pulmonary:     Effort: Pulmonary effort is normal. No respiratory distress.     Breath sounds: No wheezing, rhonchi or rales.  Abdominal:     General: Abdomen is flat. Bowel sounds are normal. There is no distension.     Palpations: Abdomen is soft.     Tenderness: There is no abdominal tenderness.  Musculoskeletal:     Cervical back: Neck supple. No tenderness.     Right lower leg: No edema.     Left lower leg: No edema.  Lymphadenopathy:     Cervical: No cervical  adenopathy.  Skin:    General: Skin is warm and dry.     Findings: No rash.  Neurological:     General: No focal deficit present.     Mental Status: He is alert and oriented to person, place, and time.  Psychiatric:        Mood  and Affect: Mood normal.        Behavior: Behavior normal.      Assessment:      Coronary artery disease involving coronary bypass graft of native heart without angina pectoris  Diabetes mellitus without complication (HCC)  Essential hypertension  Hypercholesterolemia  Permanent atrial fibrillation (HCC)  Age-related osteoporosis with current pathological fracture, sequela  Prostate cancer (HCC)  OSA (obstructive sleep apnea)  Chronic venous insufficiency  Barrett's esophagus with dysplasia  BMI 29.0-29.9,adult  Gastroesophageal reflux disease, unspecified whether esophagitis present  Microalbuminuria due to type 2 diabetes mellitus (HCC)  Benign prostatic hyperplasia without lower urinary tract symptoms  Hx of CABG    Plan:     During the course of the visit the patient was educated and counseled about appropriate screening and preventive services including:   Pneumococcal vaccine  Influenza vaccine Bone densitometry screening Colorectal cancer screening Advanced directives: discussed  Diet review for nutrition referral? Yes ____  Not Indicated __X__   Patient Instructions (the written plan) was given to the patient.  Coronary artery disease involving coronary bypass graft of native heart without angina pectoris The patient has CAD without any signs/symptoms of angina.  I have reviewed his medications.  We will continue with risk factor modifcation.  He cannot be on a beta blocker due to recent history of bradycardia.  I tried to start him on jardiance for his heart as well as his urine microalbuminuria but he could not afford it.  We will continue with risk factor modification.  He remains on plavix 75mg  daily.  Chronic venous insufficiency He does not use a diuretic and he states that compression hose do not help.  He is currently not having any swelling.  Essential hypertension We stopped him off his hydrochlorothiazide and his BP is still a little  elevated.  I am going to restart him back on hydrochlorothiazide.  Permanent atrial fibrillation (HCC) He is not on any rate control meds but his A. Fib is doing well.  We will continue him on eliquis at this time.  OSA (obstructive sleep apnea) He is compliant with his CPAP at home.  Barrett's esophagus Continue his PPI and he will need a repeat EGD in 2025.  GERD (gastroesophageal reflux disease) Continue his PPI.  Microalbuminuria due to type 2 diabetes mellitus (HCC) Again, his insurance copay was too expensive for farxiga and jardiance.  He remains on an ACE-I at this time.  We will check a urine albumin/creatinine ratio today.  Age-related osteoporosis with current pathological fracture He fell off prolia during the COVID-19 pandemic.  I am going to repeat a bone density on him at this time.  BPH (benign prostatic hyperplasia) This is followed by urology.  Prostate cancer He has active prostate cancer but is under surveillance by urology.  BMI 29.0-29.9,adult He is pretty active at his home with taking care of his wife and he also does wood working in his shop almost daily.  Hx of CABG As above.  Hypercholesterolemia WE will check his LDL today with goal <55 with his diabetes and heart disease.   Prevention Health maintenance discussed.  He will need EGD next  year.  We will obain some yearly labs.    Medicare Attestation I have personally reviewed: The patient's medical and social history Their use of alcohol, tobacco or illicit drugs Their current medications and supplements The patient's functional ability including ADLs,fall risks, home safety risks, cognitive, and hearing and visual impairment Diet and physical activities Evidence for depression or mood disorders  The patient's weight, height, and BMI have been recorded in the chart.  I have made referrals, counseling, and provided education to the patient based on review of the above and I have provided the  patient with a written personalized care plan for preventive services.     Crist Fat, MD   05/14/2023

## 2023-05-14 NOTE — Assessment & Plan Note (Signed)
WE will check his LDL today with goal <55 with his diabetes and heart disease.

## 2023-05-15 LAB — LIPID PANEL
Chol/HDL Ratio: 2.7 ratio (ref 0.0–5.0)
Cholesterol, Total: 76 mg/dL — ABNORMAL LOW (ref 100–199)
HDL: 28 mg/dL — ABNORMAL LOW (ref >39)
LDL Chol Calc (NIH): 36 mg/dL (ref 0–99)
Triglycerides: 42 mg/dL (ref 0–149)
VLDL Cholesterol Cal: 12 mg/dL (ref 5–40)

## 2023-05-15 LAB — CMP14 + ANION GAP
ALT: 15 [IU]/L (ref 0–44)
AST: 22 [IU]/L (ref 0–40)
Albumin: 4 g/dL (ref 3.7–4.7)
Alkaline Phosphatase: 71 [IU]/L (ref 44–121)
Anion Gap: 17 mmol/L (ref 10.0–18.0)
BUN/Creatinine Ratio: 13 (ref 10–24)
BUN: 17 mg/dL (ref 8–27)
Bilirubin Total: 0.7 mg/dL (ref 0.0–1.2)
CO2: 23 mmol/L (ref 20–29)
Calcium: 9.1 mg/dL (ref 8.6–10.2)
Chloride: 102 mmol/L (ref 96–106)
Creatinine, Ser: 1.26 mg/dL (ref 0.76–1.27)
Globulin, Total: 2.3 g/dL (ref 1.5–4.5)
Glucose: 96 mg/dL (ref 70–99)
Potassium: 4.4 mmol/L (ref 3.5–5.2)
Sodium: 142 mmol/L (ref 134–144)
Total Protein: 6.3 g/dL (ref 6.0–8.5)
eGFR: 56 mL/min/{1.73_m2} — ABNORMAL LOW (ref >59)

## 2023-05-15 LAB — CBC WITH DIFFERENTIAL/PLATELET
Basophils Absolute: 0 10*3/uL (ref 0.0–0.2)
Basos: 1 %
EOS (ABSOLUTE): 0.1 10*3/uL (ref 0.0–0.4)
Eos: 2 %
Hematocrit: 36.9 % — ABNORMAL LOW (ref 37.5–51.0)
Hemoglobin: 11.8 g/dL — ABNORMAL LOW (ref 13.0–17.7)
Immature Grans (Abs): 0 10*3/uL (ref 0.0–0.1)
Immature Granulocytes: 0 %
Lymphocytes Absolute: 1.1 10*3/uL (ref 0.7–3.1)
Lymphs: 22 %
MCH: 27.8 pg (ref 26.6–33.0)
MCHC: 32 g/dL (ref 31.5–35.7)
MCV: 87 fL (ref 79–97)
Monocytes Absolute: 0.7 10*3/uL (ref 0.1–0.9)
Monocytes: 13 %
Neutrophils Absolute: 3.1 10*3/uL (ref 1.4–7.0)
Neutrophils: 62 %
Platelets: 162 10*3/uL (ref 150–450)
RBC: 4.25 x10E6/uL (ref 4.14–5.80)
RDW: 14.4 % (ref 11.6–15.4)
WBC: 5 10*3/uL (ref 3.4–10.8)

## 2023-05-15 LAB — MICROALBUMIN / CREATININE URINE RATIO
Creatinine, Urine: 95.9 mg/dL
Microalb/Creat Ratio: 962 mg/g{creat} — ABNORMAL HIGH (ref 0–29)
Microalbumin, Urine: 922.2 ug/mL

## 2023-05-15 LAB — TSH: TSH: 2.54 u[IU]/mL (ref 0.450–4.500)

## 2023-05-15 LAB — HEMOGLOBIN A1C
Est. average glucose Bld gHb Est-mCnc: 137 mg/dL
Hgb A1c MFr Bld: 6.4 % — ABNORMAL HIGH (ref 4.8–5.6)

## 2023-05-20 ENCOUNTER — Other Ambulatory Visit: Payer: Self-pay

## 2023-05-20 ENCOUNTER — Ambulatory Visit: Payer: Medicare Other | Admitting: Adult Health

## 2023-05-20 MED ORDER — DAPAGLIFLOZIN PROPANEDIOL 10 MG PO TABS: 10.0000 mg | ORAL_TABLET | Freq: Every day | ORAL | 2 refills | Status: DC

## 2023-05-20 MED ORDER — DAPAGLIFLOZIN PROPANEDIOL 5 MG PO TABS: 10.0000 mg | ORAL_TABLET | Freq: Every day | ORAL | 1 refills | Status: DC

## 2023-05-20 NOTE — Progress Notes (Signed)
New Rx

## 2023-05-20 NOTE — Progress Notes (Signed)
His kidney function has worsened. He needs to be on farxiga. I want him referred to nephrology at this time.  Patient is aware of Lab results and new med farxiga and referral.

## 2023-05-20 NOTE — Progress Notes (Signed)
Entry Error

## 2023-05-23 ENCOUNTER — Ambulatory Visit: Payer: Medicare Other | Admitting: Adult Health

## 2023-06-02 DIAGNOSIS — E119 Type 2 diabetes mellitus without complications: Secondary | ICD-10-CM | POA: Diagnosis not present

## 2023-06-04 ENCOUNTER — Telehealth: Payer: Self-pay

## 2023-06-04 ENCOUNTER — Other Ambulatory Visit: Payer: Self-pay | Admitting: *Deleted

## 2023-06-04 MED ORDER — APIXABAN 2.5 MG PO TABS: 2.5000 mg | ORAL_TABLET | Freq: Two times a day (BID) | ORAL | 1 refills | Status: DC

## 2023-06-04 NOTE — Telephone Encounter (Signed)
Prescription refill request for Eliquis received. Indication: AF Last office visit: 07/16/22  R Revankar MD Scr: 1.26 on 05/14/23  Epic Age: 88 Weight: 94.5kg  Based on above findings Eliquis 5mg  twice daily is the appropriate dose.  Refill approved.

## 2023-06-04 NOTE — Addendum Note (Signed)
Addended by: Louanna Raw on: 06/04/2023 01:20 PM   Modules accepted: Orders

## 2023-06-04 NOTE — Telephone Encounter (Signed)
Refill for Eliquis sent to requesting pharmacy.

## 2023-06-04 NOTE — Telephone Encounter (Signed)
[facility] in [place] is requesting a refill on Eliquis 5 mg BID

## 2023-06-12 ENCOUNTER — Other Ambulatory Visit: Payer: Self-pay | Admitting: Cardiology

## 2023-07-01 ENCOUNTER — Encounter: Payer: Self-pay | Admitting: Cardiology

## 2023-07-01 ENCOUNTER — Ambulatory Visit: Payer: Medicare Other | Attending: Cardiology | Admitting: Cardiology

## 2023-07-01 VITALS — BP 152/78 | HR 56 | Ht 69.0 in | Wt 205.0 lb

## 2023-07-01 DIAGNOSIS — I4821 Permanent atrial fibrillation: Secondary | ICD-10-CM | POA: Diagnosis not present

## 2023-07-01 DIAGNOSIS — I10 Essential (primary) hypertension: Secondary | ICD-10-CM | POA: Diagnosis not present

## 2023-07-01 DIAGNOSIS — Z951 Presence of aortocoronary bypass graft: Secondary | ICD-10-CM

## 2023-07-01 DIAGNOSIS — E782 Mixed hyperlipidemia: Secondary | ICD-10-CM

## 2023-07-01 DIAGNOSIS — G4733 Obstructive sleep apnea (adult) (pediatric): Secondary | ICD-10-CM

## 2023-07-01 DIAGNOSIS — I340 Nonrheumatic mitral (valve) insufficiency: Secondary | ICD-10-CM

## 2023-07-01 DIAGNOSIS — I25810 Atherosclerosis of coronary artery bypass graft(s) without angina pectoris: Secondary | ICD-10-CM | POA: Diagnosis not present

## 2023-07-01 MED ORDER — APIXABAN 5 MG PO TABS: 5.0000 mg | ORAL_TABLET | Freq: Two times a day (BID) | ORAL | 12 refills | Status: DC

## 2023-07-01 NOTE — Patient Instructions (Signed)
 Medication Instructions:  Your physician has recommended you make the following change in your medication:   Increase your Eliquis  to 5 mg twice daily  *If you need a refill on your cardiac medications before your next appointment, please call your pharmacy*   Lab Work: None ordered If you have labs (blood work) drawn today and your tests are completely normal, you will receive your results only by: MyChart Message (if you have MyChart) OR A paper copy in the mail If you have any lab test that is abnormal or we need to change your treatment, we will call you to review the results.   Testing/Procedures: None ordered   Follow-Up: At [facility], you and your health needs are our priority.  As part of our continuing mission to provide you with exceptional heart care, we have created designated Provider Care Teams.  These Care Teams include your primary Cardiologist (physician) and Advanced Practice Providers (APPs -  Physician Assistants and Nurse Practitioners) who all work together to provide you with the care you need, when you need it.  We recommend signing up for the patient portal called MyChart.  Sign up information is provided on this After Visit Summary.  MyChart is used to connect with patients for Virtual Visits (Telemedicine).  Patients are able to view lab/test results, encounter notes, upcoming appointments, etc.  Non-urgent messages can be sent to your provider as well.   To learn more about what you can do with MyChart, go to [email or web address].    Your next appointment:   12 month(s)  The format for your next appointment:   In Person  Provider:   Jennifer Crape, MD    Other Instructions none  Important Information About Sugar

## 2023-07-01 NOTE — Progress Notes (Signed)
 Cardiology Office Note:    Date:  07/01/2023   ID:  Todd Sampson, DOB 1934-11-12, MRN [id number]   PCP:  Fleeta Valeria Mayo, MD  Cardiologist:  Jennifer JONELLE Crape, MD   Referring MD: Fleeta Valeria Mayo, MD    ASSESSMENT:    1. Mixed hyperlipidemia   2. Permanent atrial fibrillation (HCC)   3. Mitral valve insufficiency, unspecified etiology   4. Essential hypertension   5. Coronary artery disease involving coronary bypass graft of native heart without angina pectoris   6. OSA (obstructive sleep apnea)   7. Hx of CABG    PLAN:    In order of problems listed above:  Coronary artery disease: Secondary prevention stressed to the patient.  Importance of compliance with diet medication stressed and he vocalized understanding.  He was advised to ambulate to the best of his ability. Permanent atrial fibrillation:I discussed with the patient atrial fibrillation, disease process. Management and therapy including rate and rhythm control, anticoagulation benefits and potential risks were discussed extensively with the patient. Patient had multiple questions which were answered to patient's satisfaction.  Patient's Eliquis  was increased to full dose based on kidney function. Mixed dyslipidemia: On lipid-lowering medications followed by primary care.  Lipids reviewed from [facility] sheet. Essential hypertension: Blood pressure is elevated.  Diet was emphasized.  Weight reduction stressed.  He promises to do better.  Salt intake issues were discussed.  Follow-up in 6 months or earlier if he has any concerns.  He will keep a track of his blood pressures at home.  He gives me a history of whitecoat hypertension.  I checked blood pressure again and it was similar.   Medication Adjustments/Labs and Tests Ordered: Current medicines are reviewed at length with the patient today.  Concerns regarding medicines are outlined above.  Orders Placed This Encounter  Procedures   EKG 12-Lead   No orders of the defined  types were placed in this encounter.    No chief complaint on file.    History of Present Illness:    Todd Sampson is a 88 y.o. male.  Patient has past medical history of coronary artery disease, permanent atrial fibrillation, essential hypertension, mixed dyslipidemia and sleep apnea.  He denies any problems at this time and takes care of activities of daily living.  No chest pain orthopnea or PND.  At the time of my evaluation, the patient is alert awake oriented and in no distress.  Past Medical History:  Diagnosis Date   Age-related osteoporosis with current pathological fracture 05/14/2023   Anemia    Barrett's esophagus    Bilateral carpal tunnel syndrome 10/16/2022   BMI 29.0-29.9,adult 05/14/2023   BMI 32.0-32.9,adult 11/09/2019   BPH (benign prostatic hyperplasia) 10/17/2021   Calculus of gallbladder with chronic cholecystitis without obstruction 11/09/2019   Chronic venous insufficiency 05/14/2023   Compression fracture of L2 lumbar vertebra (HCC) 05/14/2023   Coronary artery disease involving coronary bypass graft of native heart without angina pectoris 05/14/2023   Coronary artery disease involving native coronary artery of native heart with unstable angina pectoris (HCC) 12/04/2021   Coronary artery disease of native artery of native heart with stable angina pectoris (HCC) 06/06/2016   Diet-controlled diabetes mellitus (HCC) 06/06/2016   Dyslipidemia (high LDL; low HDL) 06/06/2016   Essential hypertension 06/06/2016   GERD (gastroesophageal reflux disease) 10/17/2021   Guillain-Barre disease (HCC)    H/O: hemorrhoidectomy    Hx of CABG 10/02/2017   Hypercholesteremia 10/17/2021   Hypercholesterolemia 05/14/2023  Hyperlipidemia    Microalbuminuria due to type 2 diabetes mellitus (HCC) 05/08/2022   Mitral regurgitation 07/26/2019   Non-intractable vomiting with nausea 11/09/2019   Obesity (BMI 30.0-34.9) 12/13/2021   OSA (obstructive sleep apnea) 07/15/2022    Permanent atrial fibrillation (HCC) 06/06/2016   Postoperative examination 12/13/2019   Preoperative cardiovascular examination 10/02/2017   Primary osteoarthritis of left hip 04/20/2018   Added automatically from request for surgery [id number]  Formatting of this note might be different from the original. Added automatically from request for surgery [id number]   Prostate cancer (HCC)    Snoring 03/26/2022   Special screening examination for viral disease 11/09/2019   Thyroid  nodule 11/06/2022   Toe pain, bilateral 09/10/2019   Type 2 diabetes, diet controlled (HCC) 10/17/2021   Umbilical hernia without obstruction and without gangrene 11/09/2019   Unilateral primary osteoarthritis, right hip 04/30/2021    Past Surgical History:  Procedure Laterality Date   APPENDECTOMY     CARDIAC BYPASS     CORONARY STENT INTERVENTION N/A 12/04/2021   Procedure: CORONARY STENT INTERVENTION;  Surgeon: Dann Candyce RAMAN, MD;  [facility] INVASIVE CV LAB;  Service: Cardiovascular;  Laterality: N/A;   LEFT HEART CATH AND CORS/GRAFTS ANGIOGRAPHY N/A 12/04/2021   Procedure: LEFT HEART CATH AND CORS/GRAFTS ANGIOGRAPHY;  Surgeon: Dann Candyce RAMAN, MD;  Location: [facility] INVASIVE CV LAB;  Service: Cardiovascular;  Laterality: N/A;   REPLACEMENT TOTAL KNEE BILATERAL      Current Medications: Current Meds  Medication Sig   acetaminophen  (TYLENOL ) 500 MG tablet Take 500-1,000 mg by mouth every 6 (six) hours as needed for pain.   amLODipine  (NORVASC ) 5 MG tablet Take 1 tablet (5 mg total) by mouth daily.   apixaban  (ELIQUIS ) 2.5 MG TABS tablet Take 1 tablet (2.5 mg total) by mouth 2 (two) times daily.   clopidogrel  (PLAVIX ) 75 MG tablet Take 1 tablet (75 mg total) by mouth daily with breakfast. PATIENT NEEDS TO KEEP APPOINTMENT FOR 07/01/2023 FOR FURTHER REFILLS   dapagliflozin  propanediol (FARXIGA ) 10 MG TABS tablet Take 1 tablet (10 mg total) by mouth daily.   finasteride  (PROSCAR ) 5 MG tablet Take 5 mg by mouth  daily.    fluorouracil (EFUDEX) 5 % cream Apply topically 2 (two) times daily.   folic acid  (FOLVITE ) 1 MG tablet Take 1 mg by mouth daily.   hydrALAZINE  (APRESOLINE ) 25 MG tablet TAKE ONE TABLET BY MOUTH TWICE DAILY WITH FOOD   hydrochlorothiazide  (HYDRODIURIL ) 25 MG tablet Take 1 tablet (25 mg total) by mouth daily.   isosorbide  mononitrate (IMDUR ) 120 MG 24 hr tablet Take 1 tablet (120 mg total) by mouth daily.   lisinopril (ZESTRIL) 40 MG tablet TAKE ONE TABLET BY MOUTH DAILY   nitroGLYCERIN  (NITROSTAT ) 0.4 MG SL tablet Place 1 tablet (0.4 mg total) under the tongue every 5 (five) minutes as needed for chest pain.   Omega-3 Fatty Acids (FISH OIL) 1000 MG CAPS Take 1,000 mg by mouth daily.   pantoprazole  (PROTONIX ) 40 MG tablet TAKE ONE TABLET BY MOUTH ONCE DAILY   rosuvastatin  (CRESTOR ) 20 MG tablet TAKE ONE TABLET BY MOUTH DAILY   thiamine 100 MG tablet Take 100 mg by mouth daily.     Allergies:   Penicillins   Social History   Socioeconomic History   Marital status: Married    Spouse name: Not on file   Number of children: Not on file   Years of education: Not on file   Highest education level: Not on file  Occupational  History   Not on file  Tobacco Use   Smoking status: Former   Smokeless tobacco: Never  Vaping Use   Vaping status: Never Used  Substance and Sexual Activity   Alcohol  use: No   Drug use: No   Sexual activity: Not on file  Other Topics Concern   Not on file  Social History Narrative   Not on file   Social Drivers of Health   Financial Resource Strain: Not on file  Food Insecurity: Not on file  Transportation Needs: Not on file  Physical Activity: Not on file  Stress: Not on file  Social Connections: Not on file     Family History: The patient's family history includes Cancer in his brother and mother; Heart disease in his brother, father, and sister.  ROS:   Please see the history of present illness.    All other systems reviewed and are  negative.  EKGs/Labs/Other Studies Reviewed:    The following studies were reviewed today: .SABRAEKG Interpretation Date/Time:  Tuesday July 01 2023 15:39:13 EST Ventricular Rate:  56 PR Interval:    QRS Duration:  126 QT Interval:  472 QTC Calculation: 455 R Axis:   118  Text Interpretation: Atrial fibrillation with slow ventricular response Non-specific intra-ventricular conduction block T wave abnormality, consider inferior ischemia Abnormal ECG When compared with ECG of 05-Dec-2021 07:03, QRS axis Shifted right T wave inversion now evident in Inferior leads T wave inversion less evident in Lateral leads Confirmed by Edwyna Backers [phone]) on 07/01/2023 2:47:39 PM     Recent Labs: 05/14/2023: ALT 15; BUN 17; Creatinine, Ser 1.26; Hemoglobin 11.8; Platelets 162; Potassium 4.4; Sodium 142; TSH 2.540  Recent Lipid Panel    Component Value Date/Time   CHOL 76 (L) 05/14/2023 1540   TRIG 42 05/14/2023 1540   HDL 28 (L) 05/14/2023 1540   CHOLHDL 2.7 05/14/2023 1540   LDLCALC 36 05/14/2023 1540    Physical Exam:    VS:  BP (!) 152/78   Pulse (!) 56   Ht 5' 9 (1.753 m)   Wt 205 lb (93 kg)   SpO2 97%   BMI 30.27 kg/m     Wt Readings from Last 3 Encounters:  07/01/23 205 lb (93 kg)  05/14/23 202 lb (91.6 kg)  02/12/23 199 lb 12.8 oz (90.6 kg)     GEN: Patient is in no acute distress HEENT: Normal NECK: No JVD; No carotid bruits LYMPHATICS: No lymphadenopathy CARDIAC: Hear sounds regular, 2/6 systolic murmur at the apex. RESPIRATORY:  Clear to auscultation without rales, wheezing or rhonchi  ABDOMEN: Soft, non-tender, non-distended MUSCULOSKELETAL:  No edema; No deformity  SKIN: Warm and dry NEUROLOGIC:  Alert and oriented x 3 PSYCHIATRIC:  Normal affect   Signed, Backers JONELLE Edwyna, MD  07/01/2023 2:57 PM    [facility]

## 2023-07-14 ENCOUNTER — Ambulatory Visit: Payer: Medicare Other | Admitting: Adult Health

## 2023-07-14 ENCOUNTER — Encounter: Payer: Self-pay | Admitting: Adult Health

## 2023-07-14 VITALS — BP 160/80 | HR 53 | Ht 69.0 in | Wt 208.0 lb

## 2023-07-14 DIAGNOSIS — I10 Essential (primary) hypertension: Secondary | ICD-10-CM

## 2023-07-14 DIAGNOSIS — G4733 Obstructive sleep apnea (adult) (pediatric): Secondary | ICD-10-CM | POA: Diagnosis not present

## 2023-07-14 NOTE — Progress Notes (Signed)
 @Patient  ID: Todd Sampson, male    DOB: 19-Jun-1935, 89 y.o.   MRN: [id number]  Chief Complaint  Patient presents with   Follow-up    Referring provider: Fleeta Valeria Mayo, MD  HPI: 89 year old male seen for sleep consult March 26, 2022 for snoring and nocturnal hypoxemia found to have severe obstructive sleep apnea Medical history significant for A-fib   TEST/EVENTS :  2D echo December 05, 2021 showed EF at 55 to 60%, normal pulmonary artery systolic pressure.    July 15, 2022 that showed severe sleep apnea with a AHI at 31.4/hour and SpO2 low at 74%. Titration portion not done .   07/14/2023 Follow up: OSA  Patient presents for a 1-month follow-up.  Patient has severe obstructive sleep apnea is on CPAP therapy.  Patient says he is doing well on CPAP therapy.  Feels that he benefits from CPAP with decreased daytime sleepiness.  CPAP download shows excellent compliance with daily average usage at 7.5 hours.  Patient is on auto CPAP 5 to 15 cm H2O.  Daily average pressure at 12.6 cm H2O.  AHI is 7.1/hour. Feels pressure is comfortable . Using a full face mask .   Allergies  Allergen Reactions   Penicillins Other (See Comments)    Childhood reaction--pt does not remember    Immunization History  Administered Date(s) Administered   Influenza Inj Mdck Quad Pf 05/08/2022   Influenza, Mdck, Trivalent,PF 6+ MOS(egg free) 05/14/2023   Zoster Recombinant(Shingrix) 05/20/2019    Past Medical History:  Diagnosis Date   Age-related osteoporosis with current pathological fracture 05/14/2023   Anemia    Barrett's esophagus    Bilateral carpal tunnel syndrome 10/16/2022   BMI 29.0-29.9,adult 05/14/2023   BMI 32.0-32.9,adult 11/09/2019   BPH (benign prostatic hyperplasia) 10/17/2021   Calculus of gallbladder with chronic cholecystitis without obstruction 11/09/2019   Chronic venous insufficiency 05/14/2023   Compression fracture of L2 lumbar vertebra (HCC) 05/14/2023   Coronary artery  disease involving coronary bypass graft of native heart without angina pectoris 05/14/2023   Coronary artery disease involving native coronary artery of native heart with unstable angina pectoris (HCC) 12/04/2021   Coronary artery disease of native artery of native heart with stable angina pectoris (HCC) 06/06/2016   Diet-controlled diabetes mellitus (HCC) 06/06/2016   Dyslipidemia (high LDL; low HDL) 06/06/2016   Essential hypertension 06/06/2016   GERD (gastroesophageal reflux disease) 10/17/2021   Guillain-Barre disease (HCC)    H/O: hemorrhoidectomy    Hx of CABG 10/02/2017   Hypercholesteremia 10/17/2021   Hypercholesterolemia 05/14/2023   Hyperlipidemia    Microalbuminuria due to type 2 diabetes mellitus (HCC) 05/08/2022   Mitral regurgitation 07/26/2019   Non-intractable vomiting with nausea 11/09/2019   Obesity (BMI 30.0-34.9) 12/13/2021   OSA (obstructive sleep apnea) 07/15/2022   Permanent atrial fibrillation (HCC) 06/06/2016   Postoperative examination 12/13/2019   Preoperative cardiovascular examination 10/02/2017   Primary osteoarthritis of left hip 04/20/2018   Added automatically from request for surgery [id number]  Formatting of this note might be different from the original. Added automatically from request for surgery [id number]   Prostate cancer (HCC)    Snoring 03/26/2022   Special screening examination for viral disease 11/09/2019   Thyroid  nodule 11/06/2022   Toe pain, bilateral 09/10/2019   Type 2 diabetes, diet controlled (HCC) 10/17/2021   Umbilical hernia without obstruction and without gangrene 11/09/2019   Unilateral primary osteoarthritis, right hip 04/30/2021    Tobacco History: Social History   Tobacco Use  Smoking  Status Former  Smokeless Tobacco Never   Counseling given: Not Answered   Outpatient Medications Prior to Visit  Medication Sig Dispense Refill   acetaminophen  (TYLENOL ) 500 MG tablet Take 500-1,000 mg by mouth every 6 (six) hours as  needed for pain.     amLODipine  (NORVASC ) 5 MG tablet Take 1 tablet (5 mg total) by mouth daily. 90 tablet 2   apixaban  (ELIQUIS ) 5 MG TABS tablet Take 1 tablet (5 mg total) by mouth 2 (two) times daily. 60 tablet 12   clopidogrel  (PLAVIX ) 75 MG tablet Take 1 tablet (75 mg total) by mouth daily with breakfast. PATIENT NEEDS TO KEEP APPOINTMENT FOR 07/01/2023 FOR FURTHER REFILLS 90 tablet 0   dapagliflozin  propanediol (FARXIGA ) 10 MG TABS tablet Take 1 tablet (10 mg total) by mouth daily. 90 tablet 2   finasteride  (PROSCAR ) 5 MG tablet Take 5 mg by mouth daily.      folic acid  (FOLVITE ) 1 MG tablet Take 1 mg by mouth daily.     hydrALAZINE  (APRESOLINE ) 25 MG tablet TAKE ONE TABLET BY MOUTH TWICE DAILY WITH FOOD 180 tablet 1   hydrochlorothiazide  (HYDRODIURIL ) 25 MG tablet Take 1 tablet (25 mg total) by mouth daily. 90 tablet 3   isosorbide  mononitrate (IMDUR ) 120 MG 24 hr tablet Take 1 tablet (120 mg total) by mouth daily. 90 tablet 2   lisinopril (ZESTRIL) 40 MG tablet TAKE ONE TABLET BY MOUTH DAILY 90 tablet 1   nitroGLYCERIN  (NITROSTAT ) 0.4 MG SL tablet Place 1 tablet (0.4 mg total) under the tongue every 5 (five) minutes as needed for chest pain. 25 tablet 3   Omega-3 Fatty Acids (FISH OIL) 1000 MG CAPS Take 1,000 mg by mouth daily.     pantoprazole  (PROTONIX ) 40 MG tablet TAKE ONE TABLET BY MOUTH ONCE DAILY 90 tablet 1   rosuvastatin  (CRESTOR ) 20 MG tablet TAKE ONE TABLET BY MOUTH DAILY 90 tablet 2   thiamine 100 MG tablet Take 100 mg by mouth daily.     fluorouracil (EFUDEX) 5 % cream Apply topically 2 (two) times daily. (Patient not taking: Reported on 07/14/2023)     No facility-administered medications prior to visit.     Review of Systems:   Constitutional:   No  weight loss, night sweats,  Fevers, chills, fatigue, or  lassitude.  HEENT:   No headaches,  Difficulty swallowing,  Tooth/dental problems, or  Sore throat,                No sneezing, itching, ear ache, nasal congestion,  post nasal drip,   CV:  No chest pain,  Orthopnea, PND, swelling in lower extremities, anasarca, dizziness, palpitations, syncope.   GI  No heartburn, indigestion, abdominal pain, nausea, vomiting, diarrhea, change in bowel habits, loss of appetite, bloody stools.   Resp: No shortness of breath with exertion or at rest.  No excess mucus, no productive cough,  No non-productive cough,  No coughing up of blood.  No change in color of mucus.  No wheezing.  No chest wall deformity  Skin: no rash or lesions.  GU: no dysuria, change in color of urine, no urgency or frequency.  No flank pain, no hematuria   MS:  No joint pain or swelling.  No decreased range of motion.  No back pain.    Physical Exam  BP (!) 160/80 (BP Location: Left Arm, Patient Position: Sitting, Cuff Size: Normal)   Pulse (!) 53   Ht 5' 9 (1.753 m)   Wt  208 lb (94.3 kg)   SpO2 98%   BMI 30.72 kg/m   GEN: A/Ox3; pleasant , NAD, well nourished   NOSE-clear, THROAT-clear, no lesions, no postnasal drip or exudate noted. Class 3 MP airway   NECK:  Supple w/ fair ROM; no JVD; normal carotid impulses w/o bruits; no thyromegaly or nodules palpated; no lymphadenopathy.    RESP  Clear  P & A; w/o, wheezes/ rales/ or rhonchi. no accessory muscle use, no dullness to percussion  CARD:  RRR, no m/r/g, tr peripheral edema, pulses intact, no cyanosis or clubbing.  GI:   Soft & nt; nml bowel sounds; no organomegaly or masses detected.   Musco: Warm bil, no deformities or joint swelling noted.   Neuro: alert, no focal deficits noted.    Skin: Warm, no lesions or rashes    Lab Results:  CBC     BNP   ProBNP No results found for: PROBNP  Imaging: No results found.  Administration History     None           No data to display          No results found for: NITRICOXIDE      Assessment & Plan:   OSA (obstructive sleep apnea) Severe obstructive sleep apnea.  Patient has perceived benefit  on CPAP.  Will adjust CPAP pressure in hopes to decrease number of sleep apneic events.  Change CPAP to 13 cm H2O.  CPAP download in 1 month  Plan  Patient Instructions  Continue on CPAP At bedtime , wear all night long.  Change CPAP pressure to 13cmH2O CPAP download in 1 month  Work on healthy weight loss  Do not drive if sleepy  Follow up with cardiology and Primary MD for Hypertension.  Follow up in 1 year with Dr. Neda or Todd Sao NP     Hypertension Hypertension-blood pressure is elevated today.  Patient is asymptomatic.  Continue follow-up with primary care for ongoing blood pressure management.     Madelin Stank, NP 07/14/2023

## 2023-07-14 NOTE — Assessment & Plan Note (Signed)
 Hypertension-blood pressure is elevated today.  Patient is asymptomatic.  Continue follow-up with primary care for ongoing blood pressure management.

## 2023-07-14 NOTE — Patient Instructions (Addendum)
 Continue on CPAP At bedtime , wear all night long.  Change CPAP pressure to 13cmH2O CPAP download in 1 month  Work on healthy weight loss  Do not drive if sleepy  Follow up with cardiology and Primary MD for Hypertension.  Follow up in 1 year with Dr. Neda or Numa Schroeter NP

## 2023-07-14 NOTE — Assessment & Plan Note (Signed)
 Severe obstructive sleep apnea.  Patient has perceived benefit on CPAP.  Will adjust CPAP pressure in hopes to decrease number of sleep apneic events.  Change CPAP to 13 cm H2O.  CPAP download in 1 month  Plan  Patient Instructions  Continue on CPAP At bedtime , wear all night long.  Change CPAP pressure to 13cmH2O CPAP download in 1 month  Work on healthy weight loss  Do not drive if sleepy  Follow up with cardiology and Primary MD for Hypertension.  Follow up in 1 year with Dr. Neda or Chantrell Apsey NP

## 2023-07-22 ENCOUNTER — Other Ambulatory Visit: Payer: Self-pay

## 2023-07-22 MED ORDER — APIXABAN 5 MG PO TABS: 5.0000 mg | ORAL_TABLET | Freq: Two times a day (BID) | ORAL | 1 refills | Status: DC

## 2023-07-22 NOTE — Telephone Encounter (Signed)
Prescription refill request for Eliquis received. Indication: AF Last office visit: 07/01/23  R Revankar MD Scr: 1.26 on 05/14/23  Epic Age: 89 Weight: 93kg  Based on above findings Eliquis 5mg  twice daily is the appropriate dose.  Refill approved.

## 2023-07-24 ENCOUNTER — Other Ambulatory Visit: Payer: Self-pay | Admitting: Internal Medicine

## 2023-07-24 ENCOUNTER — Other Ambulatory Visit: Payer: Self-pay

## 2023-07-24 MED ORDER — DAPAGLIFLOZIN PROPANEDIOL 10 MG PO TABS: 10.0000 mg | ORAL_TABLET | Freq: Every day | ORAL | 2 refills | Status: DC

## 2023-08-04 ENCOUNTER — Other Ambulatory Visit: Payer: Self-pay | Admitting: Cardiology

## 2023-08-04 ENCOUNTER — Other Ambulatory Visit: Payer: Self-pay | Admitting: Internal Medicine

## 2023-08-04 NOTE — Telephone Encounter (Signed)
 Rx refill sent to pharmacy.

## 2023-08-07 DIAGNOSIS — G5602 Carpal tunnel syndrome, left upper limb: Secondary | ICD-10-CM | POA: Diagnosis not present

## 2023-08-11 ENCOUNTER — Ambulatory Visit: Payer: Medicare Other | Admitting: Internal Medicine

## 2023-08-11 ENCOUNTER — Encounter: Payer: Self-pay | Admitting: Internal Medicine

## 2023-08-11 VITALS — BP 132/64 | HR 62 | Temp 97.7°F | Resp 18 | Ht 69.5 in | Wt 202.2 lb

## 2023-08-11 DIAGNOSIS — I4821 Permanent atrial fibrillation: Secondary | ICD-10-CM

## 2023-08-11 DIAGNOSIS — R809 Proteinuria, unspecified: Secondary | ICD-10-CM

## 2023-08-11 DIAGNOSIS — E1129 Type 2 diabetes mellitus with other diabetic kidney complication: Secondary | ICD-10-CM | POA: Diagnosis not present

## 2023-08-11 DIAGNOSIS — I10 Essential (primary) hypertension: Secondary | ICD-10-CM

## 2023-08-11 MED ORDER — PANTOPRAZOLE SODIUM 40 MG PO TBEC: 40.0000 mg | DELAYED_RELEASE_TABLET | Freq: Every day | ORAL | 1 refills | Status: DC

## 2023-08-11 MED ORDER — HYDRALAZINE HCL 25 MG PO TABS: 25.0000 mg | ORAL_TABLET | Freq: Two times a day (BID) | ORAL | 1 refills | Status: DC

## 2023-08-11 NOTE — Assessment & Plan Note (Signed)
 He still has a lot of microalbuminuiria.  We will check his CMP today and his HgBA1c.  I will fill out paperwork for him to get assistance for farxiga .

## 2023-08-11 NOTE — Assessment & Plan Note (Signed)
 His A. Fib is rate controlled and he has recently seen cardiology where they increased his dose of apixaban .  This is stable and we will continue to follow.

## 2023-08-11 NOTE — Assessment & Plan Note (Signed)
 His BP is doing well and is controlled.  He is back on hydrochlorothiazide .  We will continue with amlodipine , hydralazine , hydrochlorothiazide , Imdur , and lisinopril.

## 2023-08-11 NOTE — Progress Notes (Signed)
 Office Visit  Subjective   Patient ID: Todd Sampson   DOB: 09-29-1934   Age: 89 y.o.   MRN: [id number]   Chief Complaint Chief Complaint  Patient presents with   Follow-up    Patient needs form filled out for medication     History of Present Illness The patient is a 89 year old Caucasian/White male who returns for a follow-up visit for his T2 diabetes.  This past year, we tried to start him on jardiance  and farxiga  for his diabetic macroalbuminuria but he could not afford this medication.  Since the last visit, there have been no problems. He remains has been on farxiga  10mg  daily which he took for a month before he ran out of samples. He is not walking as much as they would like. He specifically denies unexplained abdominal pain, nausea or vomiting and documented hypoglycemia. He does not routinely check blood sugars. He came in fasting today in anticipation of lab work.  His last HgBA1c was done 3 months ago and was 6.4%. He has no long term complications of diabetes- no diabetic retinopathy, neuropathy or nephropathy.  We noted he had some macroalbuminuria on his yearly exam in 01/2022.  He remains on an ACE inhibitor.  His last yearly dilated eye exam was on 06/02/2023 and this showed no evidence of diabetic retinopathy.   The patient is a 89 year old Caucasian/White male who presents for a follow-up evaluation of hypertension.  This past year, I also started him on bisoprolol.  However, he stopped this medication due to his HR going down to the 40's and he was having fatigue.  He only took bisoprolol for 1 week.   The patient has been checking his blood pressure at home.  He states his systolic blood pressure at home just a few times and it has run in the 130-140's.  The patient's current medications include: amlodipine  5 mg daily, hydralazine  25mg  BID, hydrochlorothiazide  25mg  daily, Imdur  120 mg daily, and lisinopril 40 mg daily. The patient has been tolerating his medications well. The  patient denies any headache, visual changes, dizziness, lightheadness, chest pain, shortness of breath, weakness/numbness, and edema.  He reports there have been no other symptoms noted.  He is still using compression hose for his history of venous insufficiency.  This patient also has moderately severe atrial fibrillation of about years known duration and presents today for a status visit.  He did see Dr. Harlan Liberty on 1231/2024 where he felt the patient was stable with his A. Fib and CAD.  He felt his kidney function was doing better and increased his eliquis  back up to 5mg  BID.  His atrial fibrillation is controlled with therapy as summarized in the medication list and previous notes.  He is not on any rate or rhythm control medications.  The patient remains on anticoagulation with eliquis  5mg  po BID.  Specifically denied complaints: chest pain, palpitations, generalized weakness, SOB, syncope or TIAs.  He denies any bleeding or abnormal bruising.  There is no BRBPR or melena.  His CHADSVASC score is a 3.        Past Medical History Past Medical History:  Diagnosis Date   Age-related osteoporosis with current pathological fracture 05/14/2023   Anemia    Barrett's esophagus    Bilateral carpal tunnel syndrome 10/16/2022   BMI 29.0-29.9,adult 05/14/2023   BMI 32.0-32.9,adult 11/09/2019   BPH (benign prostatic hyperplasia) 10/17/2021   Calculus of gallbladder with chronic cholecystitis without obstruction 11/09/2019  Chronic venous insufficiency 05/14/2023   Compression fracture of L2 lumbar vertebra (HCC) 05/14/2023   Coronary artery disease involving coronary bypass graft of native heart without angina pectoris 05/14/2023   Coronary artery disease involving native coronary artery of native heart with unstable angina pectoris (HCC) 12/04/2021   Coronary artery disease of native artery of native heart with stable angina pectoris (HCC) 06/06/2016   Diet-controlled diabetes mellitus (HCC)  06/06/2016   Dyslipidemia (high LDL; low HDL) 06/06/2016   Essential hypertension 06/06/2016   GERD (gastroesophageal reflux disease) 10/17/2021   Guillain-Barre disease (HCC)    H/O: hemorrhoidectomy    Hx of CABG 10/02/2017   Hypercholesteremia 10/17/2021   Hypercholesterolemia 05/14/2023   Hyperlipidemia    Microalbuminuria due to type 2 diabetes mellitus (HCC) 05/08/2022   Mitral regurgitation 07/26/2019   Non-intractable vomiting with nausea 11/09/2019   Obesity (BMI 30.0-34.9) 12/13/2021   OSA (obstructive sleep apnea) 07/15/2022   Permanent atrial fibrillation (HCC) 06/06/2016   Postoperative examination 12/13/2019   Preoperative cardiovascular examination 10/02/2017   Primary osteoarthritis of left hip 04/20/2018   Added automatically from request for surgery [phone]  Formatting of this note might be different from the original. Added automatically from request for surgery [phone]   Prostate cancer [facility])    Snoring 03/26/2022   Special screening examination for viral disease 11/09/2019   Thyroid  nodule 11/06/2022   Toe pain, bilateral 09/10/2019   Type 2 diabetes, diet controlled (HCC) 10/17/2021   Umbilical hernia without obstruction and without gangrene 11/09/2019   Unilateral primary osteoarthritis, right hip 04/30/2021     Allergies Allergies  Allergen Reactions   Penicillins Other (See Comments)    Childhood reaction--pt does not remember     Medications  Current Outpatient Medications:    acetaminophen  (TYLENOL ) 500 MG tablet, Take 500-1,000 mg by mouth every 6 (six) hours as needed for pain., Disp: , Rfl:    amLODipine  (NORVASC ) 5 MG tablet, Take 1 tablet (5 mg total) by mouth daily., Disp: 90 tablet, Rfl: 2   apixaban  (ELIQUIS ) 5 MG TABS tablet, Take 1 tablet (5 mg total) by mouth 2 (two) times daily., Disp: 180 tablet, Rfl: 1   clopidogrel  (PLAVIX ) 75 MG tablet, Take 1 tablet (75 mg total) by mouth daily with breakfast. PATIENT NEEDS TO KEEP APPOINTMENT FOR  07/01/2023 FOR FURTHER REFILLS, Disp: 90 tablet, Rfl: 0   dapagliflozin  propanediol (FARXIGA ) 10 MG TABS tablet, Take 1 tablet (10 mg total) by mouth daily., Disp: 90 tablet, Rfl: 2   finasteride  (PROSCAR ) 5 MG tablet, Take 5 mg by mouth daily. , Disp: , Rfl:    fluorouracil (EFUDEX) 5 % cream, Apply topically 2 (two) times daily. (Patient not taking: Reported on 07/14/2023), Disp: , Rfl:    folic acid  (FOLVITE ) 1 MG tablet, Take 1 mg by mouth daily., Disp: , Rfl:    hydrALAZINE  (APRESOLINE ) 25 MG tablet, Take 1 tablet (25 mg total) by mouth 2 (two) times daily with a meal., Disp: 180 tablet, Rfl: 1   hydrochlorothiazide  (HYDRODIURIL ) 25 MG tablet, Take 1 tablet (25 mg total) by mouth daily., Disp: 90 tablet, Rfl: 3   isosorbide  mononitrate (IMDUR ) 120 MG 24 hr tablet, Take 1 tablet (120 mg total) by mouth daily., Disp: 90 tablet, Rfl: 2   lisinopril (ZESTRIL) 40 MG tablet, TAKE ONE TABLET BY MOUTH DAILY, Disp: 90 tablet, Rfl: 1   nitroGLYCERIN  (NITROSTAT ) 0.4 MG SL tablet, Place 1 tablet (0.4 mg total) under the tongue every 5 (five) minutes as needed  for chest pain., Disp: 25 tablet, Rfl: 3   Omega-3 Fatty Acids (FISH OIL) 1000 MG CAPS, Take 1,000 mg by mouth daily., Disp: , Rfl:    pantoprazole  (PROTONIX ) 40 MG tablet, Take 1 tablet (40 mg total) by mouth daily., Disp: 90 tablet, Rfl: 1   rosuvastatin  (CRESTOR ) 20 MG tablet, TAKE ONE TABLET BY MOUTH DAILY, Disp: 90 tablet, Rfl: 2   thiamine 100 MG tablet, Take 100 mg by mouth daily., Disp: , Rfl:    Review of Systems Review of Systems  Constitutional:  Negative for chills and fever.  Eyes:  Negative for blurred vision and double vision.  Respiratory:  Negative for cough, hemoptysis and shortness of breath.   Cardiovascular:  Negative for chest pain, palpitations and leg swelling.  Gastrointestinal:  Negative for abdominal pain, blood in stool, constipation, diarrhea, melena, nausea and vomiting.  Genitourinary:  Negative for frequency and  hematuria.  Musculoskeletal:  Negative for myalgias.  Skin:  Negative for itching and rash.  Neurological:  Negative for dizziness, weakness and headaches.  Endo/Heme/Allergies:  Negative for polydipsia.       Objective:    Vitals BP 132/64 (BP Location: Right Arm, Patient Position: Sitting)   Pulse 62   Temp 97.7 F (36.5 C) (Temporal)   Resp 18   Ht 5' 9.5" (1.765 m)   Wt 202 lb 4 oz (91.7 kg)   SpO2 99%   BMI 29.44 kg/m    Physical Examination Physical Exam Constitutional:      Appearance: Normal appearance. He is not ill-appearing.  Cardiovascular:     Rate and Rhythm: Normal rate and regular rhythm.     Pulses: Normal pulses.     Heart sounds: No murmur heard.    No friction rub. No gallop.  Pulmonary:     Effort: Pulmonary effort is normal. No respiratory distress.     Breath sounds: No wheezing, rhonchi or rales.  Abdominal:     General: Abdomen is flat. Bowel sounds are normal. There is no distension.     Palpations: Abdomen is soft.     Tenderness: There is no abdominal tenderness.  Musculoskeletal:     Right lower leg: No edema.     Left lower leg: No edema.  Skin:    General: Skin is warm and dry.     Findings: No rash.  Neurological:     General: No focal deficit present.     Mental Status: He is alert and oriented to person, place, and time.  Psychiatric:        Mood and Affect: Mood normal.        Behavior: Behavior normal.        Assessment & Plan:   Essential hypertension His BP is doing well and is controlled.  He is back on hydrochlorothiazide .  We will continue with amlodipine , hydralazine , hydrochlorothiazide , Imdur , and lisinopril.  Permanent atrial fibrillation (HCC) His A. Fib is rate controlled and he has recently seen cardiology where they increased his dose of apixaban .  This is stable and we will continue to follow.  Microalbuminuria due to type 2 diabetes mellitus (HCC) He still has a lot of microalbuminuiria.  We will check  his CMP today and his HgBA1c.  I will fill out paperwork for him to get assistance for farxiga .    Return in about 3 months (around 11/08/2023).   Wayne Haines, MD

## 2023-08-12 LAB — CMP14 + ANION GAP
ALT: 19 [IU]/L (ref 0–44)
AST: 24 [IU]/L (ref 0–40)
Albumin: 4.2 g/dL (ref 3.7–4.7)
Alkaline Phosphatase: 66 [IU]/L (ref 44–121)
Anion Gap: 18 mmol/L (ref 10.0–18.0)
BUN/Creatinine Ratio: 21 (ref 10–24)
BUN: 28 mg/dL — ABNORMAL HIGH (ref 8–27)
Bilirubin Total: 0.5 mg/dL (ref 0.0–1.2)
CO2: 21 mmol/L (ref 20–29)
Calcium: 9.5 mg/dL (ref 8.6–10.2)
Chloride: 100 mmol/L (ref 96–106)
Creatinine, Ser: 1.32 mg/dL — ABNORMAL HIGH (ref 0.76–1.27)
Globulin, Total: 2.6 g/dL (ref 1.5–4.5)
Glucose: 90 mg/dL (ref 70–99)
Potassium: 5.1 mmol/L (ref 3.5–5.2)
Sodium: 139 mmol/L (ref 134–144)
Total Protein: 6.8 g/dL (ref 6.0–8.5)
eGFR: 52 mL/min/{1.73_m2} — ABNORMAL LOW (ref >59)

## 2023-08-12 LAB — HEMOGLOBIN A1C
Est. average glucose Bld gHb Est-mCnc: 140 mg/dL
Hgb A1c MFr Bld: 6.5 % — ABNORMAL HIGH (ref 4.8–5.6)

## 2023-08-14 NOTE — Progress Notes (Signed)
Todd Fat, MD  Christianne Dolin, CMA Tell him that his diabetes is controlled.  He needs to stop his hydrochlorothiazide as his kidney function is a bit off.  Patient informed of results and recommendations. 08/14/23 11:40 a.m.

## 2023-08-22 ENCOUNTER — Other Ambulatory Visit: Payer: Self-pay

## 2023-08-28 ENCOUNTER — Other Ambulatory Visit: Payer: Self-pay

## 2023-08-28 MED ORDER — DAPAGLIFLOZIN PROPANEDIOL 10 MG PO TABS: 10.0000 mg | ORAL_TABLET | Freq: Every day | ORAL | 2 refills | Status: DC

## 2023-09-05 ENCOUNTER — Telehealth: Payer: Self-pay

## 2023-09-05 NOTE — Telephone Encounter (Signed)
   Pre-operative Risk Assessment    Patient Name: Todd Sampson  DOB: [birth year] MRN: [id number]   Date of last office visit: 07-01-23  Date of next office visit: N/A   Request for Surgical Clearance    Procedure:   left carpal tunnel release  Date of Surgery:  Clearance 09/18/23                                Surgeon:  Dr. Shaune Pollack Surgeon's Group or Practice Name:  [facility] Phone number:  [phone] Fax number:  [phone] Dale Valdez   Type of Clearance Requested:   - Pharmacy:  Hold Clopidogrel (Plavix) and Apixaban (Eliquis) please advise if needed   Type of Anesthesia:  Local    Additional requests/questions:    Merlene Laughter   09/05/2023, 10:20 AM

## 2023-09-05 NOTE — Telephone Encounter (Signed)
 Caller from the [facility] calling to advise, nothing further needed.

## 2023-09-05 NOTE — Telephone Encounter (Signed)
 Patient with diagnosis of afib on Eliquis for anticoagulation.    Procedure: left carpal tunnel release  Date of procedure: 09/18/23   CHA2DS2-VASc Score = 4   This indicates a 4.8% annual risk of stroke. The patient's score is based upon: CHF History: 0 HTN History: 1 Diabetes History: 0 Stroke History: 0 Vascular Disease History: 1 Age Score: 2 Gender Score: 0      CrCl 44 ml/min Platelet count 162  Per office protocol, patient can hold Eliquis for 2 days prior to procedure.    **This guidance is not considered finalized until pre-operative APP has relayed final recommendations.**

## 2023-09-05 NOTE — Telephone Encounter (Signed)
 Call placed to [facility] to see if they are going to require medical clearance along with the medication clearance.  Left a detailed message for them to call back or re send the clearance over with the corrections.

## 2023-09-08 NOTE — Telephone Encounter (Signed)
   Name: Todd Sampson  DOB: 26-Jun-1935  MRN: [id number]   Primary Cardiologist: Garwin Brothers, MD  Chart reviewed as part of pre-operative protocol coverage.   Pt was last seen by Dr. Tomie China in 06/2023. Request provided was to hold Eliquis and Clopidogrel only. Medical clearance was not requested.  History reviewed by Pharm.D. and recommendation per office protocol is to hold Eliquis for 2 days prior to his procedure.  History was also reviewed with Dr. Tomie China who agreed that the patient could hold Plavix for 5 days prior to his procedure.  He did request that the patient take aspirin while off of Plavix.  Recommendations Eliquis can be held for 2 days prior to his procedure and resumed post op when felt to be safe. Clopidogrel (Plavix) can be held for 5 days prior to his procedure and resumed post op when felt to be safe.  We prefer the patient take aspirin 81 mg once daily while off of Plavix. Aspirin can be discontinued when he resumes Plavix and Eliquis. If medical clearance needed, please contact our office.  I will route this recommendation to the requesting party via Epic fax function and remove from pre-op pool. Please call with questions.  Tereso Newcomer, PA-C 09/08/2023, 3:55 PM

## 2023-09-08 NOTE — Telephone Encounter (Signed)
 Notes faxed to surgeon. This phone note will be removed from the preop pool. Tereso Newcomer, PA-C  09/08/2023 4:07 PM

## 2023-09-08 NOTE — Telephone Encounter (Signed)
 I s/w the surgeon surgery scheduler and reviewed the medications to be held, how long and did confirm if surgeon will be fine with pt on ASA while off Plavix and Eliquis. I assured the surgery scheduler I will fax notes to their office after I call the pt and review medication recommendations.    I then called the pt and left a message to call our office back to discuss blood thinners to be held and to take ASA 81 mg while off Plavix and Eliquis.

## 2023-09-09 ENCOUNTER — Other Ambulatory Visit: Payer: Self-pay | Admitting: Cardiology

## 2023-09-09 NOTE — Telephone Encounter (Signed)
 Pt returned call and has been made aware of preop App's recommendations below.  He will also reach out to his surgeon to make sure ok to take Aspirin 81 while off Plavix / Eliquis.

## 2023-09-12 ENCOUNTER — Encounter: Payer: Self-pay | Admitting: Internal Medicine

## 2023-09-12 ENCOUNTER — Ambulatory Visit: Admitting: Internal Medicine

## 2023-09-12 ENCOUNTER — Other Ambulatory Visit: Payer: Self-pay

## 2023-09-12 VITALS — BP 130/78 | HR 75 | Temp 98.7°F | Resp 19 | Ht 69.0 in | Wt 209.1 lb

## 2023-09-12 DIAGNOSIS — R0609 Other forms of dyspnea: Secondary | ICD-10-CM

## 2023-09-12 DIAGNOSIS — J90 Pleural effusion, not elsewhere classified: Secondary | ICD-10-CM | POA: Diagnosis not present

## 2023-09-12 DIAGNOSIS — I509 Heart failure, unspecified: Secondary | ICD-10-CM

## 2023-09-12 HISTORY — DX: Other forms of dyspnea: R06.09

## 2023-09-12 MED ORDER — FUROSEMIDE 40 MG PO TABS: 40.0000 mg | ORAL_TABLET | Freq: Every day | ORAL | 11 refills | Status: AC

## 2023-09-12 NOTE — Progress Notes (Signed)
 Office Visit  Subjective   Patient ID: Todd Sampson   DOB: Nov 04, 1934   Age: 89 y.o.   MRN: [id number]   Chief Complaint Chief Complaint  Patient presents with   Acute Visit    SOB     History of Present Illness Todd Sampson returns today for an acute visit for worsening SOB that began 2 weeks ago.  He states that at baseline, he normally does not have SOB or DOE.  He has a PMHx of DM, HTN, A. Fib, CAD with CABG, OSA and asthma.  He states his SOB came on acutely where he noted that when he helps to move his bedridden wife, he has SOB.  He has DOE at minimal exertion but no SOB at rest.  There has been no chest pain, heart palpitations, peripheral edema, nausea, vomiting, diaphoresis, or syncope.  He has occasional cough and sometimes coughs up gray or brown sputum.  He denies any wheezing but thinks he is having problems with his asthma.  He states he is having orthopnea which started 2 days ago.  The patient has a history of mild persistent asthma which was diagnosed in 05/2014.  He tells me that he is not taking any pulmonary inhalers.  Again, Pulmonary questioned whether he may have had developing COPD.  The patient has been placed on BREO but was not compliant with this due to cost and him not having much symptoms of his asthma.   See PMH for summary of pulmonary history and lab reports for status of any previous PFTs: Asthma and CAD. Comorbid conditions : none.. He has the following baseline symptoms / status issues: no baseline SOB or DOE. The patient's condition is controlled by medications as summarized in the medication list on the face sheet. He has the following modifiable risk factors: sedentary lifestyle. He had a CXR in 05/2014 and this showed no evidence of COPD.     Past Medical History Past Medical History:  Diagnosis Date   Age-related osteoporosis with current pathological fracture 05/14/2023   Anemia    Barrett's esophagus    Bilateral carpal tunnel syndrome 10/16/2022    BMI 29.0-29.9,adult 05/14/2023   BMI 32.0-32.9,adult 11/09/2019   BPH (benign prostatic hyperplasia) 10/17/2021   Calculus of gallbladder with chronic cholecystitis without obstruction 11/09/2019   Chronic venous insufficiency 05/14/2023   Compression fracture of L2 lumbar vertebra (HCC) 05/14/2023   Coronary artery disease involving coronary bypass graft of native heart without angina pectoris 05/14/2023   Coronary artery disease involving native coronary artery of native heart with unstable angina pectoris (HCC) 12/04/2021   Coronary artery disease of native artery of native heart with stable angina pectoris (HCC) 06/06/2016   Diet-controlled diabetes mellitus (HCC) 06/06/2016   Dyslipidemia (high LDL; low HDL) 06/06/2016   Essential hypertension 06/06/2016   GERD (gastroesophageal reflux disease) 10/17/2021   Guillain-Barre disease (HCC)    H/O: hemorrhoidectomy    Hx of CABG 10/02/2017   Hypercholesteremia 10/17/2021   Hypercholesterolemia 05/14/2023   Hyperlipidemia    Microalbuminuria due to type 2 diabetes mellitus (HCC) 05/08/2022   Mitral regurgitation 07/26/2019   Non-intractable vomiting with nausea 11/09/2019   Obesity (BMI 30.0-34.9) 12/13/2021   OSA (obstructive sleep apnea) 07/15/2022   Permanent atrial fibrillation (HCC) 06/06/2016   Postoperative examination 12/13/2019   Preoperative cardiovascular examination 10/02/2017   Primary osteoarthritis of left hip 04/20/2018   Added automatically from request for surgery [id number]  Formatting of this note might be different from the  original. Added automatically from request for surgery [id number]   Prostate cancer [facility])    Snoring 03/26/2022   Special screening examination for viral disease 11/09/2019   Thyroid nodule 11/06/2022   Toe pain, bilateral 09/10/2019   Type 2 diabetes, diet controlled (HCC) 10/17/2021   Umbilical hernia without obstruction and without gangrene 11/09/2019   Unilateral primary osteoarthritis, right  hip 04/30/2021     Allergies Allergies  Allergen Reactions   Penicillins Other (See Comments)    Childhood reaction--pt does not remember     Medications  Current Outpatient Medications:    acetaminophen (TYLENOL) 500 MG tablet, Take 500-1,000 mg by mouth every 6 (six) hours as needed for pain., Disp: , Rfl:    amLODipine (NORVASC) 5 MG tablet, Take 1 tablet (5 mg total) by mouth daily., Disp: 90 tablet, Rfl: 2   apixaban (ELIQUIS) 5 MG TABS tablet, Take 1 tablet (5 mg total) by mouth 2 (two) times daily., Disp: 180 tablet, Rfl: 1   clopidogrel (PLAVIX) 75 MG tablet, Take 1 tablet (75 mg total) by mouth daily with breakfast. PATIENT NEEDS TO KEEP APPOINTMENT FOR 07/01/2023 FOR FURTHER REFILLS, Disp: 90 tablet, Rfl: 0   dapagliflozin propanediol (FARXIGA) 10 MG TABS tablet, Take 1 tablet (10 mg total) by mouth daily., Disp: 90 tablet, Rfl: 2   finasteride (PROSCAR) 5 MG tablet, Take 5 mg by mouth daily. , Disp: , Rfl:    fluorouracil (EFUDEX) 5 % cream, Apply topically 2 (two) times daily. (Patient not taking: Reported on 07/14/2023), Disp: , Rfl:    folic acid (FOLVITE) 1 MG tablet, Take 1 mg by mouth daily., Disp: , Rfl:    hydrALAZINE (APRESOLINE) 25 MG tablet, Take 1 tablet (25 mg total) by mouth 2 (two) times daily with a meal., Disp: 180 tablet, Rfl: 1   hydrochlorothiazide (HYDRODIURIL) 25 MG tablet, Take 1 tablet (25 mg total) by mouth daily., Disp: 90 tablet, Rfl: 3   isosorbide mononitrate (IMDUR) 120 MG 24 hr tablet, Take 1 tablet (120 mg total) by mouth daily., Disp: 90 tablet, Rfl: 2   lisinopril (ZESTRIL) 40 MG tablet, TAKE ONE TABLET BY MOUTH DAILY, Disp: 90 tablet, Rfl: 1   nitroGLYCERIN (NITROSTAT) 0.4 MG SL tablet, Place 1 tablet (0.4 mg total) under the tongue every 5 (five) minutes as needed for chest pain., Disp: 25 tablet, Rfl: 3   Omega-3 Fatty Acids (FISH OIL) 1000 MG CAPS, Take 1,000 mg by mouth daily., Disp: , Rfl:    pantoprazole (PROTONIX) 40 MG tablet, Take 1  tablet (40 mg total) by mouth daily., Disp: 90 tablet, Rfl: 1   rosuvastatin (CRESTOR) 20 MG tablet, TAKE ONE TABLET BY MOUTH DAILY, Disp: 90 tablet, Rfl: 2   thiamine 100 MG tablet, Take 100 mg by mouth daily., Disp: , Rfl:    Review of Systems Review of Systems  Constitutional:  Negative for chills and fever.  Eyes:  Negative for blurred vision and double vision.  Respiratory:  Positive for cough and shortness of breath. Negative for wheezing.   Cardiovascular:  Negative for chest pain, palpitations and leg swelling.  Gastrointestinal:  Negative for abdominal pain, constipation, diarrhea, nausea and vomiting.  Skin:  Negative for rash.  Neurological:  Negative for dizziness, weakness and headaches.       Objective:    Vitals BP 130/78   Pulse 75   Temp 98.7 F (37.1 C)   Resp 19   Ht 5\' 9"  (1.753 m)   Wt 209 lb 1.6  oz (94.8 kg)   SpO2 94%   BMI 30.88 kg/m    Physical Examination Physical Exam Constitutional:      Appearance: Normal appearance. He is not ill-appearing.  Cardiovascular:     Rate and Rhythm: Normal rate and regular rhythm.     Pulses: Normal pulses.     Heart sounds: No murmur heard.    No friction rub. No gallop.  Pulmonary:     Effort: Pulmonary effort is normal. No respiratory distress.     Breath sounds: No wheezing, rhonchi or rales.  Abdominal:     General: Abdomen is flat. Bowel sounds are normal. There is no distension.     Palpations: Abdomen is soft.     Tenderness: There is no abdominal tenderness.  Musculoskeletal:     Right lower leg: No edema.     Left lower leg: No edema.  Skin:    General: Skin is warm and dry.     Findings: No rash.  Neurological:     Mental Status: He is alert.        Assessment & Plan:   DOE (dyspnea on exertion) He is having DOE.  His lungs sounds are clear and he has no evidence of volume overload.  We did an EKG and this showed a A. Fib with a HR at 54.  I am going to send him to [facility]  outpatient to do a CXR today, labs including a CBC, CMP, troponin I and a BNP.  Further care will depend on lab work.    No follow-ups on file.   Crist Fat, MD

## 2023-09-12 NOTE — Assessment & Plan Note (Signed)
 He is having DOE.  His lungs sounds are clear and he has no evidence of volume overload.  We did an EKG and this showed a A. Fib with a HR at 54.  I am going to send him to [facility] outpatient to do a CXR today, labs including a CBC, CMP, troponin I and a BNP.  Further care will depend on lab work.

## 2023-09-12 NOTE — Progress Notes (Signed)
 New Rx

## 2023-09-15 ENCOUNTER — Encounter: Payer: Self-pay | Admitting: Internal Medicine

## 2023-09-15 ENCOUNTER — Ambulatory Visit: Admitting: Internal Medicine

## 2023-09-15 VITALS — BP 128/78 | HR 60 | Temp 98.4°F | Resp 16 | Ht 69.0 in | Wt 203.0 lb

## 2023-09-15 DIAGNOSIS — I5032 Chronic diastolic (congestive) heart failure: Secondary | ICD-10-CM | POA: Insufficient documentation

## 2023-09-15 HISTORY — DX: Chronic diastolic (congestive) heart failure: I50.32

## 2023-09-15 NOTE — Progress Notes (Signed)
 Office Visit  Subjective   Patient ID: Todd Sampson   DOB: October 06, 1934   Age: 89 y.o.   MRN: [id number]   Chief Complaint Chief Complaint  Patient presents with   Follow-up     History of Present Illness Todd Sampson is a 89 yo male who returns today for followup an acute visit for probable acute diastolic CHF.  He presented to the office on 09/12/2023 with complaints of worsening SOB that began 2 weeks prior.  He states that at baseline, he normally does not have SOB or DOE.  He has a PMHx of DM, HTN, A. Fib, CAD with CABG, OSA and asthma.  He states his SOB came on acutely where he noted that when he helps to move his bedridden wife, he has SOB.  He has DOE at minimal exertion but no SOB at rest.  There has been no chest pain, heart palpitations, peripheral edema, nausea, vomiting, diaphoresis, or syncope.  He has occasional cough and sometimes coughs up gray or brown sputum.  He denies any wheezing but thinks he is having problems with his asthma.  He states he is having orthopnea which started 2 days prior.  I sent him for workup on 09/12/2023 where we obtain a CBC, CMP, Troponin I and proBNP.  His EKG showed A. Fib with HR of 54.  His labs were unremarkable except his HgB was slightly low.  His troponin was normal but his proBNP was elevated at 2310.  I did a CXR on 09/12/2023 and this showed a moderate right pleural effusion with right mid lung and basilar opacities, probably compressive atelectasis due to the effusion.  I felt he had a CHF exacerbation and stopped him off hydrochlorothiazide and put him on lasix 40mg  daily.  Today, he states his DOE has greatly improved but has not completely gone away.  He denies any chest pain, palpitations, peripheral edema.  He states his orthopnea is now gone and he can lay back to sleep.       Past Medical History Past Medical History:  Diagnosis Date   Age-related osteoporosis with current pathological fracture 05/14/2023   Anemia    Barrett's  esophagus    Bilateral carpal tunnel syndrome 10/16/2022   BMI 29.0-29.9,adult 05/14/2023   BMI 32.0-32.9,adult 11/09/2019   BPH (benign prostatic hyperplasia) 10/17/2021   Calculus of gallbladder with chronic cholecystitis without obstruction 11/09/2019   Chronic venous insufficiency 05/14/2023   Compression fracture of L2 lumbar vertebra (HCC) 05/14/2023   Coronary artery disease involving coronary bypass graft of native heart without angina pectoris 05/14/2023   Coronary artery disease involving native coronary artery of native heart with unstable angina pectoris (HCC) 12/04/2021   Coronary artery disease of native artery of native heart with stable angina pectoris (HCC) 06/06/2016   Diet-controlled diabetes mellitus (HCC) 06/06/2016   Dyslipidemia (high LDL; low HDL) 06/06/2016   Essential hypertension 06/06/2016   GERD (gastroesophageal reflux disease) 10/17/2021   Guillain-Barre disease (HCC)    H/O: hemorrhoidectomy    Hx of CABG 10/02/2017   Hypercholesteremia 10/17/2021   Hypercholesterolemia 05/14/2023   Hyperlipidemia    Microalbuminuria due to type 2 diabetes mellitus (HCC) 05/08/2022   Mitral regurgitation 07/26/2019   Non-intractable vomiting with nausea 11/09/2019   Obesity (BMI 30.0-34.9) 12/13/2021   OSA (obstructive sleep apnea) 07/15/2022   Permanent atrial fibrillation (HCC) 06/06/2016   Postoperative examination 12/13/2019   Preoperative cardiovascular examination 10/02/2017   Primary osteoarthritis of left hip 04/20/2018   Added  automatically from request for surgery [id number]  Formatting of this note might be different from the original. Added automatically from request for surgery [id number]   Prostate cancer [facility])    Snoring 03/26/2022   Special screening examination for viral disease 11/09/2019   Thyroid nodule 11/06/2022   Toe pain, bilateral 09/10/2019   Type 2 diabetes, diet controlled (HCC) 10/17/2021   Umbilical hernia without obstruction and without  gangrene 11/09/2019   Unilateral primary osteoarthritis, right hip 04/30/2021     Allergies Allergies  Allergen Reactions   Penicillins Other (See Comments)    Childhood reaction--pt does not remember     Medications  Current Outpatient Medications:    acetaminophen (TYLENOL) 500 MG tablet, Take 500-1,000 mg by mouth every 6 (six) hours as needed for pain., Disp: , Rfl:    amLODipine (NORVASC) 5 MG tablet, Take 1 tablet (5 mg total) by mouth daily., Disp: 90 tablet, Rfl: 2   apixaban (ELIQUIS) 5 MG TABS tablet, Take 1 tablet (5 mg total) by mouth 2 (two) times daily., Disp: 180 tablet, Rfl: 1   clopidogrel (PLAVIX) 75 MG tablet, Take 1 tablet (75 mg total) by mouth daily with breakfast. PATIENT NEEDS TO KEEP APPOINTMENT FOR 07/01/2023 FOR FURTHER REFILLS, Disp: 90 tablet, Rfl: 0   dapagliflozin propanediol (FARXIGA) 10 MG TABS tablet, Take 1 tablet (10 mg total) by mouth daily., Disp: 90 tablet, Rfl: 2   finasteride (PROSCAR) 5 MG tablet, Take 5 mg by mouth daily. , Disp: , Rfl:    fluorouracil (EFUDEX) 5 % cream, Apply topically 2 (two) times daily. (Patient not taking: Reported on 07/14/2023), Disp: , Rfl:    folic acid (FOLVITE) 1 MG tablet, Take 1 mg by mouth daily., Disp: , Rfl:    furosemide (LASIX) 40 MG tablet, Take 1 tablet (40 mg total) by mouth daily., Disp: 30 tablet, Rfl: 11   hydrALAZINE (APRESOLINE) 25 MG tablet, Take 1 tablet (25 mg total) by mouth 2 (two) times daily with a meal., Disp: 180 tablet, Rfl: 1   isosorbide mononitrate (IMDUR) 120 MG 24 hr tablet, Take 1 tablet (120 mg total) by mouth daily., Disp: 90 tablet, Rfl: 2   lisinopril (ZESTRIL) 40 MG tablet, TAKE ONE TABLET BY MOUTH DAILY, Disp: 90 tablet, Rfl: 1   nitroGLYCERIN (NITROSTAT) 0.4 MG SL tablet, Place 1 tablet (0.4 mg total) under the tongue every 5 (five) minutes as needed for chest pain., Disp: 25 tablet, Rfl: 3   Omega-3 Fatty Acids (FISH OIL) 1000 MG CAPS, Take 1,000 mg by mouth daily., Disp: , Rfl:     pantoprazole (PROTONIX) 40 MG tablet, Take 1 tablet (40 mg total) by mouth daily., Disp: 90 tablet, Rfl: 1   rosuvastatin (CRESTOR) 20 MG tablet, TAKE ONE TABLET BY MOUTH DAILY, Disp: 90 tablet, Rfl: 2   thiamine 100 MG tablet, Take 100 mg by mouth daily., Disp: , Rfl:    Review of Systems Review of Systems  Constitutional:  Negative for chills and fever.  Eyes:  Negative for blurred vision and double vision.  Respiratory:  Negative for shortness of breath and wheezing.   Cardiovascular:  Negative for chest pain, palpitations and leg swelling.  Gastrointestinal:  Negative for abdominal pain, constipation, diarrhea, nausea and vomiting.  Neurological:  Negative for dizziness, weakness and headaches.       Objective:    Vitals BP 128/78   Pulse 60   Temp 98.4 F (36.9 C)   Resp 16   Ht 5\' 9"  (1.753  m)   Wt 203 lb (92.1 kg)   SpO2 94%   BMI 29.98 kg/m    Physical Examination Physical Exam Constitutional:      Appearance: Normal appearance. He is not ill-appearing.  Cardiovascular:     Rate and Rhythm: Normal rate and regular rhythm.     Pulses: Normal pulses.     Heart sounds: No murmur heard.    No friction rub. No gallop.  Pulmonary:     Effort: Pulmonary effort is normal. No respiratory distress.     Breath sounds: No wheezing, rhonchi or rales.  Abdominal:     General: Abdomen is flat. Bowel sounds are normal. There is no distension.     Palpations: Abdomen is soft.     Tenderness: There is no abdominal tenderness.  Musculoskeletal:     Right lower leg: No edema.     Left lower leg: No edema.  Skin:    General: Skin is warm and dry.     Findings: No rash.  Neurological:     Mental Status: He is alert.        Assessment & Plan:   Chronic diastolic CHF (congestive heart failure) (HCC) The patient orthopnea is now gone and his DOE is almost resolved.  I asked him to stay on lasix 40mg  daily at this time.  He may need a repeat ECHO.  I am going to contact  cardiology and try to get him in this week or next.      No follow-ups on file.   Crist Fat, MD

## 2023-09-15 NOTE — Assessment & Plan Note (Signed)
 The patient orthopnea is now gone and his DOE is almost resolved.  I asked him to stay on lasix 40mg  daily at this time.  He may need a repeat ECHO.  I am going to contact cardiology and try to get him in this week or next.

## 2023-09-16 ENCOUNTER — Encounter: Payer: Self-pay | Admitting: Internal Medicine

## 2023-09-18 ENCOUNTER — Encounter: Payer: Self-pay | Admitting: Internal Medicine

## 2023-09-23 ENCOUNTER — Other Ambulatory Visit: Payer: Self-pay | Admitting: Cardiology

## 2023-09-29 ENCOUNTER — Other Ambulatory Visit: Payer: Self-pay | Admitting: Cardiology

## 2023-09-29 ENCOUNTER — Ambulatory Visit: Attending: Cardiology

## 2023-09-29 DIAGNOSIS — I509 Heart failure, unspecified: Secondary | ICD-10-CM | POA: Insufficient documentation

## 2023-09-29 LAB — ECHOCARDIOGRAM COMPLETE
Calc EF: 52.9 %
S' Lateral: 3.9 cm
Single Plane A2C EF: 55.5 %
Single Plane A4C EF: 52.4 %

## 2023-09-29 NOTE — Telephone Encounter (Signed)
 Prescription sent to pharmacy.

## 2023-10-02 ENCOUNTER — Other Ambulatory Visit: Payer: Self-pay

## 2023-10-03 ENCOUNTER — Ambulatory Visit: Attending: Cardiology | Admitting: Cardiology

## 2023-10-03 ENCOUNTER — Encounter: Payer: Self-pay | Admitting: Cardiology

## 2023-10-03 VITALS — BP 134/70 | HR 60 | Ht 69.0 in | Wt 197.8 lb

## 2023-10-03 DIAGNOSIS — Z951 Presence of aortocoronary bypass graft: Secondary | ICD-10-CM | POA: Diagnosis present

## 2023-10-03 DIAGNOSIS — I10 Essential (primary) hypertension: Secondary | ICD-10-CM

## 2023-10-03 DIAGNOSIS — I4821 Permanent atrial fibrillation: Secondary | ICD-10-CM

## 2023-10-03 DIAGNOSIS — I5032 Chronic diastolic (congestive) heart failure: Secondary | ICD-10-CM | POA: Diagnosis present

## 2023-10-03 DIAGNOSIS — I25810 Atherosclerosis of coronary artery bypass graft(s) without angina pectoris: Secondary | ICD-10-CM

## 2023-10-03 NOTE — Patient Instructions (Signed)
 Medication Instructions:  Your physician recommends that you continue on your current medications as directed. Please refer to the Current Medication list given to you today.  *If you need a refill on your cardiac medications before your next appointment, please call your pharmacy*  Lab Work: Your physician recommends that you return for lab work in: Today for Basic Metabolic Panel  If you have labs (blood work) drawn today and your tests are completely normal, you will receive your results only by: MyChart Message (if you have MyChart) OR A paper copy in the mail If you have any lab test that is abnormal or we need to change your treatment, we will call you to review the results.  Testing/Procedures: NONE  Follow-Up: At [facility], you and your health needs are our priority.  As part of our continuing mission to provide you with exceptional heart care, our providers are all part of one team.  This team includes your primary Cardiologist (physician) and Advanced Practice Providers or APPs (Physician Assistants and Nurse Practitioners) who all work together to provide you with the care you need, when you need it.  Your next appointment:   3 month(s)  Provider:   Belva Crome, MD    We recommend signing up for the patient portal called "MyChart".  Sign up information is provided on this After Visit Summary.  MyChart is used to connect with patients for Virtual Visits (Telemedicine).  Patients are able to view lab/test results, encounter notes, upcoming appointments, etc.  Non-urgent messages can be sent to your provider as well.   To learn more about what you can do with MyChart, go to [email or web address].   Other Instructions

## 2023-10-03 NOTE — Progress Notes (Signed)
 Cardiology Office Note:  .   Date:  10/03/2023  ID:  Todd Sampson, DOB Aug 25, 1934, MRN [id number] PCP: Todd Fat, MD  [facility] Providers Cardiologist:  Todd Brothers, MD    History of Present Illness: .   Todd Sampson is a 89 y.o. male with a past medical history of HFpEF, venous insufficiency, CAD s/p CABG, Hypertension, mitral regurgitation, permanent atrial fibrillation, OSA, Barrett's esophagus, Todd Sampson, bilateral carpal tunnel syndrome, dyslipidemia.  09/29/2023 echo EF 55-60%, mildly elevated PASP, biatrial enlargement, mild MR, mild calcification with aortic valve 12/05/2021 echo EF 55 to 60%, mild LVH, biatrial enlargement, mild MR with moderate calcification, moderate calcification of the aortic valve 12/04/2021 cardiac cath PCI with DES of the distal SVG to OM 2000 CABG x 3   He has been evaluated by his PCP on a few occasions for shortness of breath and worsening DOE.  Lab work revealed a proBNP 2310, chest x-ray revealed moderate right pleural effusion.  Some adjustments were made to his medications and he was advised to follow back up with cardiology.  He presents today for follow-up of his heart failure.  His PCP is starting him on Lasix and also [place], he is feeling much better.  He had a chest x-ray on 09/12/23 which revealed a moderate right pleural effusion.  He has returned to his normal activities around his home, he is the primary caregiver for his wife who suffers with dementia, he mentions that today he was out cutting the trees around his property. He denies chest pain, palpitations, dyspnea, pnd, orthopnea, n, v, dizziness, syncope, edema, weight gain, or early satiety.   ROS: Review of Systems  Endo/Heme/Allergies:  Bruises/bleeds easily.  All other systems reviewed and are negative.    Studies Reviewed: .       Cardiac Studies & Procedures    ______________________________________________________________________________________________ CARDIAC CATHETERIZATION  CARDIAC CATHETERIZATION 12/04/2021  Narrative   Mid RCA lesion is 50% stenosed.  Calcified lesion.   RV Branch lesion is 75% stenosed.   Prox RCA lesion is 40% stenosed.   Mid LAD lesion is 50% stenosed.  LIMA was not grafted to the heart.   2nd Diag lesion is 75% stenosed.  Small vessel.   Dist LAD lesion is 70% stenosed.  Severely calcified lesion.   Mid Cx lesion is 100% stenosed.  SVG to OM with Dist Graft lesion is 95% stenosed.   A drug-eluting stent was successfully placed using a SYNERGY XD 3.50X16.   Post intervention, there is a 0% residual stenosis.   LV end diastolic pressure is normal.   There is no aortic valve stenosis.  Patient reports that he had CABG x3.  There is only one graft marker.  We engaged the LIMA which did not appear to go to the heart.  Successful PCI of the distal SVG to OM.  Transient no reflow successfully treated with intracoronary verapamil.  Brilinta was given prior to the procedure.  Given that he is on Eliquis, will transition to clopidogrel tomorrow.  Plan for 30 days of aspirin, clopidogrel for 6 months and Eliquis indefinitely in the setting of atrial fibrillation.  Given issues with bradycardia during the procedure, we will watch overnight in the ICU.  Results conveyed to the patient's daughter, Todd Sampson, [phone].  Findings Coronary Findings Diagnostic  Dominance: Right  Left Anterior Descending There is mild diffuse Sampson throughout the vessel. Mid LAD lesion is 50% stenosed. Dist LAD lesion is 70% stenosed. The  lesion is severely calcified.  Second Diagonal Branch 2nd Diag lesion is 75% stenosed.  Left Circumflex There is moderate diffuse Sampson throughout the vessel. Mid Cx lesion is 100% stenosed.  Right Coronary Artery There is mild diffuse Sampson throughout the vessel. Prox RCA lesion is 40%  stenosed. Mid RCA lesion is 50% stenosed. The lesion is calcified.  Right Ventricular Branch RV Branch lesion is 75% stenosed.  Graft To 2nd Mrg Dist Graft lesion is 95% stenosed. Vessel is the culprit lesion.  Intervention  Dist Graft lesion (Graft To 2nd Mrg) Stent CATH VISTA GUIDE 6FR AL1 guide catheter was inserted. Lesion crossed with guidewire using a WIRE ASAHI PROWATER 180CM. Pre-stent angioplasty was performed using a BALLN SAPPHIRE 2.5X12. A drug-eluting stent was successfully placed using a SYNERGY XD 3.50X16. Stent strut is well apposed. Post-stent angioplasty was not performed. IC verapamil. Post-Intervention Lesion Assessment The intervention was successful. Pre-interventional TIMI flow is 3. Post-intervention TIMI flow is 3. No complications occurred at this lesion. There is a 0% residual stenosis post intervention.   STRESS TESTS  MYOCARDIAL PERFUSION IMAGING 03/27/2021  Narrative   The study is normal. Findings are consistent with no prior ischemia. The study is low risk.   No ST deviation was noted.   Left ventricular function is normal. Nuclear stress EF: 57 %. The left ventricular ejection fraction is normal (55-65%). End diastolic cavity size is normal.   Prior study available for comparison from 07/27/2020. No changes compared to prior study.   ECHOCARDIOGRAM  ECHOCARDIOGRAM COMPLETE 09/29/2023  Narrative ECHOCARDIOGRAM REPORT    Patient Name:   Todd Sampson Date of Exam: 09/29/2023 Medical Rec #:  [id number]     Height:       69.0 in Accession #:    [id number]    Weight:       203.0 lb Date of Birth:  Aug 12, 1934      BSA:          2.079 m Patient Age:    89 years      BP:           128/78 mmHg Patient Gender: M             HR:           61 bpm. Exam Location:  [place]  Procedure: 2D Echo, Cardiac Doppler, Color Doppler and Strain Analysis (Both Spectral and Color Flow Doppler were utilized during procedure).  Indications:    Heart failure, type  unknown (HCC) [I50.9 (ICD-10-CM)]  History:        Patient has prior history of Echocardiogram examinations, most recent 12/05/2021. CHF, Arrythmias:Atrial Fibrillation; Risk Factors:Dyslipidemia and Hypertension.  Sonographer:    Todd Sampson RDCS Referring Phys: [id number] Todd Sampson  IMPRESSIONS   1. Left ventricular ejection fraction, by estimation, is 55 to 60%. The left ventricle has normal function. The left ventricle has no regional wall motion abnormalities. Left ventricular diastolic parameters are indeterminate. The average left ventricular global longitudinal strain is -6.5 %. The global longitudinal strain is abnormal. 2. Right ventricular systolic function is normal. The right ventricular size is normal. There is mildly elevated pulmonary artery systolic pressure. 3. Left atrial size was severely dilated. 4. Right atrial size was severely dilated. 5. The mitral valve is normal in structure. Mild mitral valve regurgitation. No evidence of mitral stenosis. 6. The aortic valve is calcified. There is mild calcification of the aortic valve. There is mild thickening of the aortic valve. Aortic valve regurgitation is  mild. No aortic stenosis is present. 7. The inferior vena cava is normal in size with greater than 50% respiratory variability, suggesting right atrial pressure of 3 mmHg.  FINDINGS Left Ventricle: Left ventricular ejection fraction, by estimation, is 55 to 60%. The left ventricle has normal function. The left ventricle has no regional wall motion abnormalities. The average left ventricular global longitudinal strain is -6.5 %. Strain was performed and the global longitudinal strain is abnormal. The left ventricular internal cavity size was normal in size. There is no left ventricular hypertrophy. Left ventricular diastolic parameters are indeterminate.  Right Ventricle: The right ventricular size is normal. No increase in right ventricular wall thickness. Right ventricular  systolic function is normal. There is mildly elevated pulmonary artery systolic pressure. The tricuspid regurgitant velocity is 2.92 m/s, and with an assumed right atrial pressure of 3 mmHg, the estimated right ventricular systolic pressure is 37.1 mmHg.  Left Atrium: Left atrial size was severely dilated.  Right Atrium: Right atrial size was severely dilated.  Pericardium: There is no evidence of pericardial effusion.  Mitral Valve: The mitral valve is normal in structure. Mild mitral valve regurgitation. No evidence of mitral valve stenosis.  Tricuspid Valve: The tricuspid valve is normal in structure. Tricuspid valve regurgitation is mild . No evidence of tricuspid stenosis.  Aortic Valve: The aortic valve is calcified. There is mild calcification of the aortic valve. There is mild thickening of the aortic valve. Aortic valve regurgitation is mild. No aortic stenosis is present.  Pulmonic Valve: The pulmonic valve was normal in structure. Pulmonic valve regurgitation is not visualized. No evidence of pulmonic stenosis.  Aorta: The aortic root is normal in size and structure.  Venous: The inferior vena cava is normal in size with greater than 50% respiratory variability, suggesting right atrial pressure of 3 mmHg.  IAS/Shunts: No atrial level shunt detected by color flow Doppler.   LEFT VENTRICLE PLAX 2D LVIDd:         5.20 cm      Diastology LVIDs:         3.90 cm      LV e' medial:    7.07 cm/s LV PW:         1.50 cm      LV E/e' medial:  15.0 LV IVS:        1.40 cm      LV e' lateral:   12.60 cm/s LVOT diam:     2.20 cm      LV E/e' lateral: 8.4 LV SV:         69 LV SV Index:   33           2D Longitudinal Strain LVOT Area:     3.80 cm     2D Strain GLS Avg:     -6.5 %  LV Volumes (MOD) LV vol d, MOD A2C: 96.2 ml LV vol d, MOD A4C: 110.0 ml LV vol s, MOD A2C: 42.8 ml LV vol s, MOD A4C: 52.4 ml LV SV MOD A2C:     53.4 ml LV SV MOD A4C:     110.0 ml LV SV MOD BP:       56.8 ml  RIGHT VENTRICLE            IVC RV Basal diam:  3.70 cm    IVC diam: 2.30 cm RV S prime:     8.05 cm/s TAPSE (M-mode): 1.9 cm  LEFT ATRIUM  Index        RIGHT ATRIUM           Index LA diam:        4.60 cm  2.21 cm/m   RA Area:     36.60 cm LA Vol (A2C):   181.0 ml 87.06 ml/m  RA Volume:   136.00 ml 65.41 ml/m LA Vol (A4C):   157.0 ml 75.51 ml/m LA Biplane Vol: 170.0 ml 81.77 ml/m AORTIC VALVE LVOT Vmax:   77.55 cm/s LVOT Vmean:  50.550 cm/s LVOT VTI:    0.182 m  AORTA Ao Root diam: 3.90 cm Ao Asc diam:  3.70 cm Ao Desc diam: 2.25 cm  MV E velocity: 106.00 cm/s  TRICUSPID VALVE MV A velocity: 31.40 cm/s   TR Peak grad:   34.1 mmHg MV E/A ratio:  3.38         TR Vmax:        292.00 cm/s  SHUNTS Systemic VTI:  0.18 m Systemic Diam: 2.20 cm  Gypsy Balsam MD Electronically signed by Gypsy Balsam MD Signature Date/Time: 09/29/2023/12:40:02 PM    Final          ______________________________________________________________________________________________      Risk Assessment/Calculations:    CHA2DS2-VASc Score = 4   This indicates a 4.8% annual risk of stroke. The patient's score is based upon: CHF History: 0 HTN History: 1 Diabetes History: 0 Stroke History: 0 Vascular Sampson History: 1 Age Score: 2 Gender Score: 0            Physical Exam:   VS:  BP 134/70   Pulse 60   Ht 5\' 9"  (1.753 m)   Wt 197 lb 12.8 oz (89.7 kg)   SpO2 95%   BMI 29.21 kg/m    Wt Readings from Last 3 Encounters:  10/03/23 197 lb 12.8 oz (89.7 kg)  09/15/23 203 lb (92.1 kg)  09/12/23 209 lb 1.6 oz (94.8 kg)    GEN: Well nourished, well developed in no acute distress NECK: No JVD; No carotid bruits CARDIAC: Irregularly irregular, no murmurs, rubs, gallops RESPIRATORY:  Clear to auscultation without rales, wheezing or rhonchi  ABDOMEN: Soft, non-tender, non-distended EXTREMITIES:  No edema; No deformity   ASSESSMENT AND PLAN: .   HFpEF  -NYHA class I, euvolemic.  His weight in the office today is 197, and on 09/12/2023 when he saw his PCP it was 209.  I did discuss the importance of weighing himself daily however he does not think that something he can do at this time.  I advised him to keep a close eye on his abdomen swelling or worsening shortness of breath and to notify our office or his PCPs office.  Continue Farxiga 10 mg daily, continue lisinopril 40 mg daily, continue Lasix 40 mg daily.  Will repeat BMET.  CAD - s/p CABG x 3 in 2020, most recently PCI SVG to OM in 2023. Stable with no anginal symptoms. No indication for ischemic evaluation.  Continue Plavix 75 mg daily, continue Imdur 120 mg daily, continue nitroglycerin as needed.  He is not on a beta-blocker secondary to bradycardia.  Permanent atrial fibrillation/hypercoagulable state-his rate is controlled, his CHA2DS2-VASc score is 4, continue Eliquis 5 mg twice daily--no indication for dose reduction, he is tolerating well without any hematochezia, hematuria, hemoptysis.        Dispo: BMET today, follow up in 3 months with Dr. Tomie China.   Signed, Flossie Dibble, NP

## 2023-10-04 LAB — BASIC METABOLIC PANEL WITH GFR
BUN/Creatinine Ratio: 17 (ref 10–24)
BUN: 28 mg/dL — ABNORMAL HIGH (ref 8–27)
CO2: 24 mmol/L (ref 20–29)
Calcium: 9 mg/dL (ref 8.6–10.2)
Chloride: 102 mmol/L (ref 96–106)
Creatinine, Ser: 1.68 mg/dL — ABNORMAL HIGH (ref 0.76–1.27)
Glucose: 80 mg/dL (ref 70–99)
Potassium: 4.4 mmol/L (ref 3.5–5.2)
Sodium: 140 mmol/L (ref 134–144)
eGFR: 39 mL/min/1.73 — ABNORMAL LOW

## 2023-10-07 ENCOUNTER — Telehealth: Payer: Self-pay | Admitting: Emergency Medicine

## 2023-10-07 DIAGNOSIS — I25810 Atherosclerosis of coronary artery bypass graft(s) without angina pectoris: Secondary | ICD-10-CM

## 2023-10-07 NOTE — Telephone Encounter (Signed)
 Left voice mail

## 2023-10-07 NOTE — Telephone Encounter (Signed)
-----   Message from Flossie Dibble sent at 10/04/2023  5:50 PM EDT ----- Slight increase in how hard his kidneys are working however this is not unexpected following starting Lasix.  I would like to make sure he has the least drinking 48 ounces of water per day.  And lets recheck a BMET in 1 month.

## 2023-10-09 NOTE — Telephone Encounter (Signed)
 Called and spoke to patient. Reviewed results with patient as per [facility] note. Reviewed recommendations as per Jennifer's note. Patient verbalized understanding and had no further questions.

## 2023-10-09 NOTE — Addendum Note (Signed)
 Addended by: Lonia Farber on: 10/09/2023 05:28 PM   Modules accepted: Orders

## 2023-10-16 DIAGNOSIS — H9193 Unspecified hearing loss, bilateral: Secondary | ICD-10-CM | POA: Diagnosis not present

## 2023-10-23 DIAGNOSIS — R351 Nocturia: Secondary | ICD-10-CM | POA: Diagnosis not present

## 2023-10-23 DIAGNOSIS — C61 Malignant neoplasm of prostate: Secondary | ICD-10-CM | POA: Diagnosis not present

## 2023-10-30 ENCOUNTER — Other Ambulatory Visit: Payer: Self-pay | Admitting: Internal Medicine

## 2023-11-04 ENCOUNTER — Telehealth: Payer: Self-pay

## 2023-11-04 ENCOUNTER — Other Ambulatory Visit: Payer: Self-pay | Admitting: Nephrology

## 2023-11-04 DIAGNOSIS — N1832 Chronic kidney disease, stage 3b: Secondary | ICD-10-CM

## 2023-11-04 DIAGNOSIS — R809 Proteinuria, unspecified: Secondary | ICD-10-CM | POA: Diagnosis not present

## 2023-11-04 DIAGNOSIS — E1122 Type 2 diabetes mellitus with diabetic chronic kidney disease: Secondary | ICD-10-CM | POA: Diagnosis not present

## 2023-11-04 DIAGNOSIS — I129 Hypertensive chronic kidney disease with stage 1 through stage 4 chronic kidney disease, or unspecified chronic kidney disease: Secondary | ICD-10-CM | POA: Diagnosis not present

## 2023-11-04 NOTE — Telephone Encounter (Signed)
 Todd Sampson called stating that he was very dizzy and even using a walker to get around. States his Blood Pressure at Cardio appt this a.m was "he thought" 125/50... I looked over his med list to see if any new rx's were added.. there hasn't. I advised him to go to the hospital, he said he would if he could find somebody to look after his wife. I called his daughter to make her aware also

## 2023-11-10 ENCOUNTER — Encounter: Payer: Self-pay | Admitting: Internal Medicine

## 2023-11-10 ENCOUNTER — Ambulatory Visit: Payer: Medicare Other | Admitting: Internal Medicine

## 2023-11-10 VITALS — BP 146/86 | HR 94 | Temp 98.2°F | Resp 18 | Ht 69.0 in | Wt 194.8 lb

## 2023-11-10 DIAGNOSIS — E1121 Type 2 diabetes mellitus with diabetic nephropathy: Secondary | ICD-10-CM | POA: Insufficient documentation

## 2023-11-10 DIAGNOSIS — N1831 Chronic kidney disease, stage 3a: Secondary | ICD-10-CM | POA: Insufficient documentation

## 2023-11-10 DIAGNOSIS — I5032 Chronic diastolic (congestive) heart failure: Secondary | ICD-10-CM | POA: Diagnosis not present

## 2023-11-10 DIAGNOSIS — I10 Essential (primary) hypertension: Secondary | ICD-10-CM | POA: Diagnosis not present

## 2023-11-10 NOTE — Assessment & Plan Note (Signed)
 His BP is a bit elevated today but has been controlled for the last several visits.  We will see what his BP is doing on his next visit.

## 2023-11-10 NOTE — Assessment & Plan Note (Signed)
 He has diabetic nephropathy.  We will check his HgBa1c on his next visit as his A1c has been stable.  Continue on farxiga  at this time.

## 2023-11-10 NOTE — Assessment & Plan Note (Signed)
 He has stage IIIa CKD due to diabetes and HTN.  We should be receiving nephrology's note.  We will continue on lisinopril and farxiga . He is to avoid any NSAIDS.

## 2023-11-10 NOTE — Assessment & Plan Note (Signed)
 His weight today is 194 lbs.  He shows no evidence of volume overload.  I reviewed his notes and ECHO from cardiology.  He seems to be doing well.

## 2023-11-10 NOTE — Progress Notes (Signed)
 Office Visit  Subjective   Patient ID: Todd Sampson   DOB: Jul 29, 1934   Age: 89 y.o.   MRN: [id number]   Chief Complaint Chief Complaint  Patient presents with   Follow-up     History of Present Illness Mr. Barthell is a 89 yo male who returns today for followup an acute visit for probable acute diastolic CHF when I  saw him on 09/15/2023.  His orthopnea and DOE was almost resolved.  I asked him to stay on lasix  40mg  daily at this time.  He did have a repeat ECHO on 09/29/2023 and this showed a LVEF of 55 to 60%. The left ventricle had normal function. The left ventricle had no regional wall motion abnormalities. Left ventricular diastolic parameters are indeterminate. The right ventricular systolic function was normal. The right ventricular size is normal. There is mildly elevated pulmonary artery systolic pressure. His left atrial size was severely dilated and his right atrial size was severely dilated.  He did see cardiology in followup on 10/03/2023 where they felt he was euvolemic.  His weight in the office with them was 197lbs and on 09/12/2023 at our office it was 209.  She discussed the importance of weighing himself daily however he did not think that something he can do at that time.  She advised him to keep a close eye on his abdomen swelling or worsening shortness of breath and to notify their office or my office with symptoms.  She wanted him to continue Farxiga  10 mg daily, continue lisinopril 40 mg daily, continue Lasix  40 mg daily.  Again, he presented to the office on 09/12/2023 with complaints of worsening SOB that began 2 weeks prior.  He states that at baseline, he normally does not have SOB or DOE.  He has a PMHx of DM, HTN, A. Fib, CAD with CABG, OSA and asthma.  He states his SOB came on acutely where he noted that when he helps to move his bedridden wife, he has SOB.  He has DOE at minimal exertion but no SOB at rest.  There has been no chest pain, heart palpitations, peripheral edema,  nausea, vomiting, diaphoresis, or syncope.  He has occasional cough and sometimes coughs up gray or brown sputum.  He denies any wheezing but thinks he is having problems with his asthma.  He states he is having orthopnea which started 2 days prior.  I sent him for workup on 09/12/2023 where we obtain a CBC, CMP, Troponin I and proBNP.  His EKG showed A. Fib with HR of 54.  His labs were unremarkable except his HgB was slightly low.  His troponin was normal but his proBNP was elevated at 2310.  I did a CXR on 09/12/2023 and this showed a moderate right pleural effusion with right mid lung and basilar opacities, probably compressive atelectasis due to the effusion.  I felt he had a CHF exacerbation and stopped him off hydrochlorothiazide  and put him on lasix  40mg  daily.  Today, he states his DOE and orthopnea is resolved.  He denies any chest pain, palpitations, peripheral edema.    The patient is a 89 year old Caucasian/White male who returns for a follow-up visit for his T2 diabetes.  This past year, we tried to start him on jardiance  and farxiga  for his diabetic macroalbuminuria but he could not afford this medication.  Since the last visit, there have been no problems. He remains has been on farxiga  10mg  daily which he took for  a month before he ran out of samples. He is not walking as much as they would like. He specifically denies unexplained abdominal pain, nausea or vomiting and documented hypoglycemia. He does not routinely check blood sugars. He came in fasting today in anticipation of lab work.  His last HgBA1c was done 3 months ago and was 6.5%. He has no long term complications of diabetes- no diabetic retinopathy, or neuropathy.  We noted he had some macroalbuminuria on his yearly exam in 01/2022 and he may have some diabetic nephropathy with Stage II/IIIa CKD.  He remains on an ACE inhibitor and farxiga .  His last yearly dilated eye exam was on 06/02/2023 and this showed no evidence of diabetic  retinopathy.  The patient again has probable Stage II/IIIa CKD.  His CKD is probably due to HTN and diabetes.    I placed him on farxiga  in 05/2023 and he remains on lisinopril.  His creatinine has run 1.1-1.4 with a GFR range of 40-60.  He denies any use of NSAIDS.  I did refer him to nephrology whom he saw last week (no notes available yet) and he was told his kidney function was stable and they did not recommend any changes.   The patient is a 89 year old Caucasian/White male who presents for a follow-up evaluation of hypertension.  This past year, I also started him on bisoprolol.  However, he stopped this medication due to his HR going down to the 40's and he was having fatigue.  He only took bisoprolol for 1 week.   The patient has been checking his blood pressure at home.  He states his systolic blood pressure at home just a few times and it has run in the 130-140's.  The patient's current medications include: amlodipine  5 mg daily, hydralazine  25mg  BID, Lasix  40mg  daily, Imdur  120 mg daily, and lisinopril 40 mg daily. The patient has been tolerating his medications well. The patient denies any headache, visual changes, dizziness, lightheadness, chest pain, shortness of breath, weakness/numbness, and edema.  He reports there have been no other symptoms noted.  He is still using compression hose for his history of venous insufficiency.      Past Medical History Past Medical History:  Diagnosis Date   Age-related osteoporosis with current pathological fracture 05/14/2023   Anemia    Barrett's esophagus    Bilateral carpal tunnel syndrome 10/16/2022   BMI 29.0-29.9,adult 05/14/2023   BMI 32.0-32.9,adult 11/09/2019   BPH (benign prostatic hyperplasia) 10/17/2021   Calculus of gallbladder with chronic cholecystitis without obstruction 11/09/2019   Chronic diastolic CHF (congestive heart failure) (HCC) 09/15/2023   Chronic venous insufficiency 05/14/2023   Compression fracture of L2 lumbar  vertebra (HCC) 05/14/2023   Coronary artery disease involving coronary bypass graft of native heart without angina pectoris 05/14/2023   Coronary artery disease involving native coronary artery of native heart with unstable angina pectoris (HCC) 12/04/2021   Coronary artery disease of native artery of native heart with stable angina pectoris (HCC) 06/06/2016   Diet-controlled diabetes mellitus (HCC) 06/06/2016   DOE (dyspnea on exertion) 09/12/2023   Dyslipidemia (high LDL; low HDL) 06/06/2016   Essential hypertension 06/06/2016   GERD (gastroesophageal reflux disease) 10/17/2021   Guillain-Barre disease (HCC)    H/O: hemorrhoidectomy    Hx of CABG 10/02/2017   Hypercholesteremia 10/17/2021   Hypercholesterolemia 05/14/2023   Hyperlipidemia    Microalbuminuria due to type 2 diabetes mellitus (HCC) 05/08/2022   Mitral regurgitation 07/26/2019   Non-intractable vomiting with  nausea 11/09/2019   Obesity (BMI 30.0-34.9) 12/13/2021   OSA (obstructive sleep apnea) 07/15/2022   Permanent atrial fibrillation (HCC) 06/06/2016   Postoperative examination 12/13/2019   Preoperative cardiovascular examination 10/02/2017   Primary osteoarthritis of left hip 04/20/2018   Added automatically from request for surgery [phone]  Formatting of this note might be different from the original. Added automatically from request for surgery [phone]   Prostate cancer [facility])    Snoring 03/26/2022   Special screening examination for viral disease 11/09/2019   Thyroid  nodule 11/06/2022   Toe pain, bilateral 09/10/2019   Type 2 diabetes, diet controlled (HCC) 10/17/2021   Umbilical hernia without obstruction and without gangrene 11/09/2019   Unilateral primary osteoarthritis, right hip 04/30/2021     Allergies Allergies  Allergen Reactions   Penicillins Other (See Comments)    Childhood reaction--pt does not remember     Medications  Current Outpatient Medications:    acetaminophen  (TYLENOL ) 500 MG tablet,  Take 500-1,000 mg by mouth every 6 (six) hours as needed for pain., Disp: , Rfl:    amLODipine  (NORVASC ) 5 MG tablet, Take 1 tablet (5 mg total) by mouth daily., Disp: 90 tablet, Rfl: 2   apixaban  (ELIQUIS ) 5 MG TABS tablet, Take 1 tablet (5 mg total) by mouth 2 (two) times daily., Disp: 180 tablet, Rfl: 1   clopidogrel  (PLAVIX ) 75 MG tablet, Take 1 tablet (75 mg total) by mouth daily with breakfast. PATIENT NEEDS TO KEEP APPOINTMENT FOR 07/01/2023 FOR FURTHER REFILLS, Disp: 90 tablet, Rfl: 0   dapagliflozin  propanediol (FARXIGA ) 10 MG TABS tablet, Take 1 tablet (10 mg total) by mouth daily., Disp: 90 tablet, Rfl: 2   finasteride  (PROSCAR ) 5 MG tablet, Take 5 mg by mouth daily. , Disp: , Rfl:    folic acid  (FOLVITE ) 1 MG tablet, Take 1 mg by mouth daily., Disp: , Rfl:    furosemide  (LASIX ) 40 MG tablet, Take 1 tablet (40 mg total) by mouth daily., Disp: 30 tablet, Rfl: 11   hydrALAZINE  (APRESOLINE ) 25 MG tablet, Take 1 tablet (25 mg total) by mouth 2 (two) times daily with a meal., Disp: 180 tablet, Rfl: 1   isosorbide  mononitrate (IMDUR ) 120 MG 24 hr tablet, Take 1 tablet (120 mg total) by mouth daily., Disp: 90 tablet, Rfl: 2   lisinopril (ZESTRIL) 40 MG tablet, TAKE ONE TABLET BY MOUTH DAILY, Disp: 90 tablet, Rfl: 1   nitroGLYCERIN  (NITROSTAT ) 0.4 MG SL tablet, Place 1 tablet (0.4 mg total) under the tongue every 5 (five) minutes as needed for chest pain., Disp: 25 tablet, Rfl: 3   Omega-3 Fatty Acids (FISH OIL) 1000 MG CAPS, Take 1,000 mg by mouth daily., Disp: , Rfl:    pantoprazole  (PROTONIX ) 40 MG tablet, Take 1 tablet (40 mg total) by mouth daily., Disp: 90 tablet, Rfl: 1   rosuvastatin  (CRESTOR ) 20 MG tablet, Take 1 tablet (20 mg total) by mouth daily., Disp: 90 tablet, Rfl: 2   thiamine 100 MG tablet, Take 100 mg by mouth daily., Disp: , Rfl:    Review of Systems Review of Systems  Constitutional:  Negative for chills, fever and malaise/fatigue.  Eyes:  Negative for blurred vision and  double vision.  Respiratory:  Negative for shortness of breath.   Cardiovascular:  Negative for chest pain, palpitations and leg swelling.  Gastrointestinal:  Negative for abdominal pain, constipation, diarrhea, nausea and vomiting.  Genitourinary:  Negative for frequency.  Musculoskeletal:  Negative for myalgias.  Skin:  Negative for itching and rash.  Neurological:  Negative for dizziness, weakness and headaches.  Endo/Heme/Allergies:  Negative for polydipsia.       Objective:    Vitals BP (!) 146/86   Pulse 94   Temp 98.2 F (36.8 C)   Resp 18   Ht 5\' 9"  (1.753 m)   Wt 194 lb 12.8 oz (88.4 kg)   SpO2 98%   BMI 28.77 kg/m    Physical Examination Physical Exam Constitutional:      Appearance: Normal appearance. He is not ill-appearing.  Neck:     Vascular: No carotid bruit.  Cardiovascular:     Rate and Rhythm: Normal rate and regular rhythm.     Pulses: Normal pulses.     Heart sounds: No murmur heard.    No friction rub. No gallop.  Pulmonary:     Effort: Pulmonary effort is normal. No respiratory distress.     Breath sounds: No wheezing, rhonchi or rales.  Abdominal:     General: Abdomen is flat. Bowel sounds are normal. There is no distension.     Palpations: Abdomen is soft.     Tenderness: There is no abdominal tenderness.  Musculoskeletal:     Right lower leg: No edema.     Left lower leg: No edema.  Skin:    General: Skin is warm and dry.     Findings: No rash.  Neurological:     General: No focal deficit present.     Mental Status: He is alert and oriented to person, place, and time.  Psychiatric:        Mood and Affect: Mood normal.        Behavior: Behavior normal.        Assessment & Plan:   Chronic diastolic CHF (congestive heart failure) (HCC) His weight today is 194 lbs.  He shows no evidence of volume overload.  I reviewed his notes and ECHO from cardiology.  He seems to be doing well.  Essential hypertension His BP is a bit elevated  today but has been controlled for the last several visits.  We will see what his BP is doing on his next visit.  Diabetic nephropathy associated with type 2 diabetes mellitus (HCC) He has diabetic nephropathy.  We will check his HgBa1c on his next visit as his A1c has been stable.  Continue on farxiga  at this time.  Stage 3a chronic kidney disease (HCC) He has stage IIIa CKD due to diabetes and HTN.  We should be receiving nephrology's note.  We will continue on lisinopril and farxiga . He is to avoid any NSAIDS.    Return in about 3 months (around 02/10/2024).   Wayne Haines, MD

## 2023-11-18 ENCOUNTER — Ambulatory Visit
Admission: RE | Admit: 2023-11-18 | Discharge: 2023-11-18 | Disposition: A | Discharge status: Home / Self Care | Source: Ambulatory Visit | Attending: Nephrology | Admitting: Nephrology

## 2023-11-18 DIAGNOSIS — R93421 Abnormal radiologic findings on diagnostic imaging of right kidney: Secondary | ICD-10-CM | POA: Diagnosis not present

## 2023-11-18 DIAGNOSIS — N1832 Chronic kidney disease, stage 3b: Secondary | ICD-10-CM

## 2023-11-18 DIAGNOSIS — N281 Cyst of kidney, acquired: Secondary | ICD-10-CM | POA: Diagnosis not present

## 2023-11-28 ENCOUNTER — Other Ambulatory Visit: Payer: Self-pay | Admitting: Nephrology

## 2023-11-28 DIAGNOSIS — N2889 Other specified disorders of kidney and ureter: Secondary | ICD-10-CM

## 2023-12-10 ENCOUNTER — Other Ambulatory Visit: Payer: Self-pay | Admitting: Cardiology

## 2023-12-16 DIAGNOSIS — D0461 Carcinoma in situ of skin of right upper limb, including shoulder: Secondary | ICD-10-CM | POA: Diagnosis not present

## 2023-12-16 DIAGNOSIS — L821 Other seborrheic keratosis: Secondary | ICD-10-CM | POA: Diagnosis not present

## 2023-12-16 DIAGNOSIS — D0422 Carcinoma in situ of skin of left ear and external auricular canal: Secondary | ICD-10-CM | POA: Diagnosis not present

## 2023-12-16 DIAGNOSIS — L570 Actinic keratosis: Secondary | ICD-10-CM | POA: Diagnosis not present

## 2023-12-16 DIAGNOSIS — L578 Other skin changes due to chronic exposure to nonionizing radiation: Secondary | ICD-10-CM | POA: Diagnosis not present

## 2023-12-16 DIAGNOSIS — C44311 Basal cell carcinoma of skin of nose: Secondary | ICD-10-CM | POA: Diagnosis not present

## 2024-01-06 ENCOUNTER — Ambulatory Visit: Attending: Cardiology | Admitting: Cardiology

## 2024-01-06 ENCOUNTER — Encounter: Payer: Self-pay | Admitting: Cardiology

## 2024-01-06 VITALS — BP 112/60 | HR 60 | Resp 18 | Ht 69.0 in | Wt 193.4 lb

## 2024-01-06 DIAGNOSIS — I10 Essential (primary) hypertension: Secondary | ICD-10-CM | POA: Diagnosis not present

## 2024-01-06 DIAGNOSIS — E1121 Type 2 diabetes mellitus with diabetic nephropathy: Secondary | ICD-10-CM | POA: Diagnosis not present

## 2024-01-06 DIAGNOSIS — I25810 Atherosclerosis of coronary artery bypass graft(s) without angina pectoris: Secondary | ICD-10-CM | POA: Diagnosis not present

## 2024-01-06 DIAGNOSIS — G4733 Obstructive sleep apnea (adult) (pediatric): Secondary | ICD-10-CM | POA: Insufficient documentation

## 2024-01-06 DIAGNOSIS — I4821 Permanent atrial fibrillation: Secondary | ICD-10-CM | POA: Diagnosis not present

## 2024-01-06 DIAGNOSIS — Z951 Presence of aortocoronary bypass graft: Secondary | ICD-10-CM | POA: Diagnosis not present

## 2024-01-06 NOTE — Patient Instructions (Signed)

## 2024-01-06 NOTE — Progress Notes (Signed)
 Cardiology Office Note:    Date:  01/06/2024   ID:  Todd Sampson, DOB [birth year], MRN [id number]  PCP:  Fleeta Valeria Mayo, MD  Cardiologist:  Jennifer JONELLE Crape, MD   Referring MD: Fleeta Valeria Mayo, MD    ASSESSMENT:    1. Permanent atrial fibrillation (HCC)   2. Essential hypertension   3. Coronary artery disease involving coronary bypass graft of native heart without angina pectoris   4. OSA (obstructive sleep apnea)   5. Diabetic nephropathy associated with type 2 diabetes mellitus (HCC)   6. Hx of CABG    PLAN:    In order of problems listed above:  Primary prevention stressed with the patient.  Importance of compliance with diet medication stressed and patient verbalized standing. Patient was advised to ambulate to the best of his ability. Permanent atrial fibrillation:I discussed with the patient atrial fibrillation, disease process. Management and therapy including rate and rhythm control, anticoagulation benefits and potential risks were discussed extensively with the patient. Patient had multiple questions which were answered to patient's satisfaction. Mixed dyslipidemia: On lipid-lowering medications followed by primary care.  Lipids reviewed. Essential hypertension: Blood pressure is stable and diet was emphasized. Patient will be seen in follow-up appointment in 6 months or earlier if the patient has any concerns.    Medication Adjustments/Labs and Tests Ordered: Current medicines are reviewed at length with the patient today.  Concerns regarding medicines are outlined above.  No orders of the defined types were placed in this encounter.  No orders of the defined types were placed in this encounter.    No chief complaint on file.    History of Present Illness:    Todd Sampson is a 89 y.o. male.  Patient has past medical history of coronary artery disease, essential hypertension, mixed dyslipidemia post CABG and permanent atrial fibrillation.  He denies any problems  at this time and takes care of activities of daily living.  No chest pain orthopnea or PND.  He takes care of his wife full-time and stays active.  At the time of my evaluation, the patient is alert awake oriented and in no distress.  Past Medical History:  Diagnosis Date   Age-related osteoporosis with current pathological fracture 05/14/2023   Anemia    Barrett's esophagus    Bilateral carpal tunnel syndrome 10/16/2022   BMI 29.0-29.9,adult 05/14/2023   BMI 32.0-32.9,adult 11/09/2019   BPH (benign prostatic hyperplasia) 10/17/2021   Calculus of gallbladder with chronic cholecystitis without obstruction 11/09/2019   Chronic diastolic CHF (congestive heart failure) (HCC) 09/15/2023   Chronic venous insufficiency 05/14/2023   Compression fracture of L2 lumbar vertebra (HCC) 05/14/2023   Coronary artery disease involving coronary bypass graft of native heart without angina pectoris 05/14/2023   Coronary artery disease involving native coronary artery of native heart with unstable angina pectoris (HCC) 12/04/2021   Coronary artery disease of native artery of native heart with stable angina pectoris (HCC) 06/06/2016   Diet-controlled diabetes mellitus (HCC) 06/06/2016   DOE (dyspnea on exertion) 09/12/2023   Dyslipidemia (high LDL; low HDL) 06/06/2016   Essential hypertension 06/06/2016   GERD (gastroesophageal reflux disease) 10/17/2021   Guillain-Barre disease (HCC)    H/O: hemorrhoidectomy    Hx of CABG 10/02/2017   Hypercholesteremia 10/17/2021   Hypercholesterolemia 05/14/2023   Hyperlipidemia    Microalbuminuria due to type 2 diabetes mellitus (HCC) 05/08/2022   Mitral regurgitation 07/26/2019   Non-intractable vomiting with nausea 11/09/2019   Obesity (BMI 30.0-34.9) 12/13/2021  OSA (obstructive sleep apnea) 07/15/2022   Permanent atrial fibrillation (HCC) 06/06/2016   Postoperative examination 12/13/2019   Preoperative cardiovascular examination 10/02/2017   Primary  osteoarthritis of left hip 04/20/2018   Added automatically from request for surgery [phone]  Formatting of this note might be different from the original. Added automatically from request for surgery [id number]   Prostate cancer (HCC)    Snoring 03/26/2022   Special screening examination for viral disease 11/09/2019   Thyroid  nodule 11/06/2022   Toe pain, bilateral 09/10/2019   Type 2 diabetes, diet controlled (HCC) 10/17/2021   Umbilical hernia without obstruction and without gangrene 11/09/2019   Unilateral primary osteoarthritis, right hip 04/30/2021    Past Surgical History:  Procedure Laterality Date   APPENDECTOMY     CARDIAC BYPASS     CORONARY STENT INTERVENTION N/A 12/04/2021   Procedure: CORONARY STENT INTERVENTION;  Surgeon: Dann Candyce RAMAN, MD;  Location: [facility] INVASIVE CV LAB;  Service: Cardiovascular;  Laterality: N/A;   LEFT HEART CATH AND CORS/GRAFTS ANGIOGRAPHY N/A 12/04/2021   Procedure: LEFT HEART CATH AND CORS/GRAFTS ANGIOGRAPHY;  Surgeon: Dann Candyce RAMAN, MD;  Location: [facility] INVASIVE CV LAB;  Service: Cardiovascular;  Laterality: N/A;   REPLACEMENT TOTAL KNEE BILATERAL      Current Medications: Current Meds  Medication Sig   acetaminophen  (TYLENOL ) 500 MG tablet Take 500-1,000 mg by mouth every 6 (six) hours as needed for pain.   amLODipine  (NORVASC ) 5 MG tablet Take 1 tablet (5 mg total) by mouth daily.   apixaban  (ELIQUIS ) 5 MG TABS tablet Take 1 tablet (5 mg total) by mouth 2 (two) times daily.   clopidogrel  (PLAVIX ) 75 MG tablet Take 1 tablet (75 mg total) by mouth daily with breakfast.   dapagliflozin  propanediol (FARXIGA ) 10 MG TABS tablet Take 1 tablet (10 mg total) by mouth daily.   finasteride  (PROSCAR ) 5 MG tablet Take 5 mg by mouth daily.    folic acid  (FOLVITE ) 1 MG tablet Take 1 mg by mouth daily.   furosemide  (LASIX ) 40 MG tablet Take 1 tablet (40 mg total) by mouth daily.   hydrALAZINE  (APRESOLINE ) 25 MG tablet Take 1 tablet (25 mg total) by mouth 2  (two) times daily with a meal.   isosorbide  mononitrate (IMDUR ) 120 MG 24 hr tablet Take 1 tablet (120 mg total) by mouth daily.   lisinopril (ZESTRIL) 40 MG tablet TAKE ONE TABLET BY MOUTH DAILY   nitroGLYCERIN  (NITROSTAT ) 0.4 MG SL tablet Place 1 tablet (0.4 mg total) under the tongue every 5 (five) minutes as needed for chest pain.   Omega-3 Fatty Acids (FISH OIL) 1000 MG CAPS Take 1,000 mg by mouth daily.   pantoprazole  (PROTONIX ) 40 MG tablet Take 1 tablet (40 mg total) by mouth daily.   rosuvastatin  (CRESTOR ) 20 MG tablet Take 1 tablet (20 mg total) by mouth daily.   thiamine 100 MG tablet Take 100 mg by mouth daily.     Allergies:   Penicillins   Social History   Socioeconomic History   Marital status: Married    Spouse name: Not on file   Number of children: Not on file   Years of education: Not on file   Highest education level: Not on file  Occupational History   Not on file  Tobacco Use   Smoking status: Former   Smokeless tobacco: Never  Vaping Use   Vaping status: Never Used  Substance and Sexual Activity   Alcohol  use: No   Drug use: No  Sexual activity: Not on file  Other Topics Concern   Not on file  Social History Narrative   Not on file   Social Drivers of Health   Financial Resource Strain: Not on file  Food Insecurity: Not on file  Transportation Needs: Not on file  Physical Activity: Not on file  Stress: Not on file  Social Connections: Not on file     Family History: The patient's family history includes Cancer in his brother and mother; Heart disease in his brother, father, and sister.  ROS:   Please see the history of present illness.    All other systems reviewed and are negative.  EKGs/Labs/Other Studies Reviewed:    The following studies were reviewed today: I discussed my findings with the patient at length   Recent Labs: 05/14/2023: Hemoglobin 11.8; Platelets 162; TSH 2.540 08/11/2023: ALT 19 10/03/2023: BUN 28; Creatinine, Ser  1.68; Potassium 4.4; Sodium 140  Recent Lipid Panel    Component Value Date/Time   CHOL 76 (L) 05/14/2023 1540   TRIG 42 05/14/2023 1540   HDL 28 (L) 05/14/2023 1540   CHOLHDL 2.7 05/14/2023 1540   LDLCALC 36 05/14/2023 1540    Physical Exam:    VS:  BP 112/60 (BP Location: Left Arm, Patient Position: Sitting, Cuff Size: Normal)   Pulse 60   Resp 18   Ht 5' 9 (1.753 m)   Wt 193 lb 6.4 oz (87.7 kg)   SpO2 98%   BMI 28.56 kg/m     Wt Readings from Last 3 Encounters:  01/06/24 193 lb 6.4 oz (87.7 kg)  11/10/23 194 lb 12.8 oz (88.4 kg)  10/03/23 197 lb 12.8 oz (89.7 kg)     GEN: Patient is in no acute distress HEENT: Normal NECK: No JVD; No carotid bruits LYMPHATICS: No lymphadenopathy CARDIAC: Hear sounds irregular, 2/6 systolic murmur at the apex. RESPIRATORY:  Clear to auscultation without rales, wheezing or rhonchi  ABDOMEN: Soft, non-tender, non-distended MUSCULOSKELETAL:  No edema; No deformity  SKIN: Warm and dry NEUROLOGIC:  Alert and oriented x 3 PSYCHIATRIC:  Normal affect   Signed, Jennifer JONELLE Crape, MD  01/06/2024 2:10 PM    [facility]

## 2024-01-14 DIAGNOSIS — C44311 Basal cell carcinoma of skin of nose: Secondary | ICD-10-CM | POA: Diagnosis not present

## 2024-01-15 ENCOUNTER — Other Ambulatory Visit: Payer: Self-pay | Admitting: Cardiology

## 2024-01-15 NOTE — Telephone Encounter (Signed)
 Prescription refill request for Eliquis  received. Indication: afib  Last office visit: Revankar, 01/06/2024 Scr: 1.68, 10/03/2023 Age: 89 yo  Weight: 87.7 kg   Per dosing criteria he qualifies for a dose decrease. However, his past 3  (Scr) has been less then 1.5

## 2024-01-16 ENCOUNTER — Other Ambulatory Visit: Payer: Self-pay

## 2024-01-16 ENCOUNTER — Telehealth: Payer: Self-pay | Admitting: Cardiology

## 2024-01-16 MED ORDER — APIXABAN 5 MG PO TABS: 5.0000 mg | ORAL_TABLET | Freq: Two times a day (BID) | ORAL | 1 refills | Status: DC

## 2024-01-16 NOTE — Telephone Encounter (Signed)
*  STAT* If patient is at the pharmacy, call can be transferred to refill team.   1. Which medications need to be refilled? (please list name of each medication and dose if known) apixaban  (ELIQUIS ) 5 MG TABS tablet    2. Would you like to learn more about the convenience, safety, & potential cost savings by using the [facility]? NO   3. Are you open to using the [facility] NO   4. Which pharmacy/location (including street and city if local pharmacy) is medication to be sent to?[facility] - [place], [place] - [address]    5. Do they need a 30 day or 90 day supply? 90  Spoke to Kinney from [employer] in [place]. He states he sent us  a fax to have this medication refilled to his home delivery pharmacy, [facility]. But, they received the refill. Please send new refill to [facility]. Thank you!

## 2024-01-16 NOTE — Telephone Encounter (Signed)
 Will refill x 90 day and check labs before next fill to determine if dose adjustment needed.

## 2024-01-16 NOTE — Telephone Encounter (Signed)
 Prescription refill request for Eliquis  received. Indication:afib Last office visit:7/25 Scr:1.68  4/25 Age: 89 Weight:87.7  kg  Prescription refilled

## 2024-02-09 ENCOUNTER — Encounter: Payer: Self-pay | Admitting: Internal Medicine

## 2024-02-09 ENCOUNTER — Ambulatory Visit: Admitting: Internal Medicine

## 2024-02-09 VITALS — BP 122/70 | HR 60 | Temp 97.9°F | Resp 18 | Ht 69.0 in | Wt 191.1 lb

## 2024-02-09 DIAGNOSIS — N1831 Chronic kidney disease, stage 3a: Secondary | ICD-10-CM | POA: Diagnosis not present

## 2024-02-09 DIAGNOSIS — E1121 Type 2 diabetes mellitus with diabetic nephropathy: Secondary | ICD-10-CM | POA: Diagnosis not present

## 2024-02-09 DIAGNOSIS — I10 Essential (primary) hypertension: Secondary | ICD-10-CM

## 2024-02-09 MED ORDER — PANTOPRAZOLE SODIUM 40 MG PO TBEC: 40.0000 mg | DELAYED_RELEASE_TABLET | Freq: Every day | ORAL | 1 refills | Status: AC

## 2024-02-09 MED ORDER — HYDRALAZINE HCL 25 MG PO TABS: 25.0000 mg | ORAL_TABLET | Freq: Two times a day (BID) | ORAL | 1 refills | Status: AC

## 2024-02-09 NOTE — Assessment & Plan Note (Signed)
 His BP is controlled where we will continue his BP meds.

## 2024-02-09 NOTE — Assessment & Plan Note (Signed)
 We will check his HgBA1c today with goal A1c <7%.  He will remain on farxiga .

## 2024-02-09 NOTE — Progress Notes (Signed)
 Office Visit  Subjective   Patient ID: Todd Sampson   DOB: 10-Jan-1935   Age: 89 y.o.   MRN: [id number]   Chief Complaint Chief Complaint  Patient presents with   Congestive Heart Failure    3 month follow up     History of Present Illness The patient is a 89 year old Caucasian/White male who returns for a follow-up visit for his T2 diabetes.  This past year, we tried to start him on jardiance  and farxiga  for his diabetic macroalbuminuria but he could not afford this medication.  Since the last visit, there have been no problems. He remains has been on farxiga  10mg  daily. He is not walking as much as they would like. He specifically denies unexplained abdominal pain, nausea or vomiting and documented hypoglycemia. He does not routinely check blood sugars. He came in fasting today in anticipation of lab work.  His last HgBA1c was done 6 months ago and was 6.5%. He has no long term complications of diabetes- no diabetic retinopathy, or neuropathy.  We noted he had some macroalbuminuria on his yearly exam in 01/2022 and he may have some diabetic nephropathy with Stage II/IIIa CKD.  He remains on an ACE inhibitor and farxiga .  His last yearly dilated eye exam was on 06/02/2023 and this showed no evidence of diabetic retinopathy.   The patient again has probable Stage II/IIIa CKD.  His CKD is probably due to HTN and diabetes.    I placed him on farxiga  in 05/2023 and he remains on lisinopril.  His creatinine has run 1.1-1.4 with a GFR range of 40-60.  He denies any use of NSAIDS.  I did refer him to nephrology whom he saw last week (no notes available yet) and he was told his kidney function was stable and they did not recommend any changes.   The patient is a 89 year old Caucasian/White male who presents for a follow-up evaluation of hypertension.  This past year, I also started him on bisoprolol.  However, he stopped this medication due to his HR going down to the 40's and he was having fatigue.  He  only took bisoprolol for 1 week.   The patient has been checking his blood pressure at home.  He states his systolic blood pressure at home just a few times and it has run in the 120-130's.  The patient's current medications include: amlodipine  5 mg daily, hydralazine  25mg  BID, Lasix  40mg  daily, Imdur  120 mg daily, and lisinopril 40 mg daily. The patient has been tolerating his medications well. The patient denies any headache, visual changes, dizziness, lightheadness, chest pain, shortness of breath, weakness/numbness, and edema.  He reports there have been no other symptoms noted.  He is still using compression hose for his history of venous insufficiency.      Past Medical History Past Medical History:  Diagnosis Date   Age-related osteoporosis with current pathological fracture 05/14/2023   Anemia    Barrett's esophagus    Bilateral carpal tunnel syndrome 10/16/2022   BMI 29.0-29.9,adult 05/14/2023   BMI 32.0-32.9,adult 11/09/2019   BPH (benign prostatic hyperplasia) 10/17/2021   Calculus of gallbladder with chronic cholecystitis without obstruction 11/09/2019   Chronic diastolic CHF (congestive heart failure) (HCC) 09/15/2023   Chronic venous insufficiency 05/14/2023   Compression fracture of L2 lumbar vertebra (HCC) 05/14/2023   Coronary artery disease involving coronary bypass graft of native heart without angina pectoris 05/14/2023   Coronary artery disease involving native coronary artery of native  heart with unstable angina pectoris (HCC) 12/04/2021   Coronary artery disease of native artery of native heart with stable angina pectoris (HCC) 06/06/2016   Diet-controlled diabetes mellitus (HCC) 06/06/2016   DOE (dyspnea on exertion) 09/12/2023   Dyslipidemia (high LDL; low HDL) 06/06/2016   Essential hypertension 06/06/2016   GERD (gastroesophageal reflux disease) 10/17/2021   Guillain-Barre disease (HCC)    H/O: hemorrhoidectomy    Hx of CABG 10/02/2017   Hypercholesteremia  10/17/2021   Hypercholesterolemia 05/14/2023   Hyperlipidemia    Microalbuminuria due to type 2 diabetes mellitus (HCC) 05/08/2022   Mitral regurgitation 07/26/2019   Non-intractable vomiting with nausea 11/09/2019   Obesity (BMI 30.0-34.9) 12/13/2021   OSA (obstructive sleep apnea) 07/15/2022   Permanent atrial fibrillation (HCC) 06/06/2016   Postoperative examination 12/13/2019   Preoperative cardiovascular examination 10/02/2017   Primary osteoarthritis of left hip 04/20/2018   Added automatically from request for surgery [id number]  Formatting of this note might be different from the original. Added automatically from request for surgery [phone]   Prostate cancer [facility])    Snoring 03/26/2022   Special screening examination for viral disease 11/09/2019   Thyroid  nodule 11/06/2022   Toe pain, bilateral 09/10/2019   Type 2 diabetes, diet controlled (HCC) 10/17/2021   Umbilical hernia without obstruction and without gangrene 11/09/2019   Unilateral primary osteoarthritis, right hip 04/30/2021     Allergies Allergies  Allergen Reactions   Penicillins Other (See Comments)    Childhood reaction--pt does not remember     Medications  Current Outpatient Medications:    acetaminophen  (TYLENOL ) 500 MG tablet, Take 500-1,000 mg by mouth every 6 (six) hours as needed for pain., Disp: , Rfl:    amLODipine  (NORVASC ) 5 MG tablet, Take 1 tablet (5 mg total) by mouth daily., Disp: 90 tablet, Rfl: 2   apixaban  (ELIQUIS ) 5 MG TABS tablet, TAKE 1 TABLET TWICE A DAY, Disp: 180 tablet, Rfl: 0   apixaban  (ELIQUIS ) 5 MG TABS tablet, Take 1 tablet (5 mg total) by mouth 2 (two) times daily., Disp: 180 tablet, Rfl: 1   clopidogrel  (PLAVIX ) 75 MG tablet, Take 1 tablet (75 mg total) by mouth daily with breakfast., Disp: 90 tablet, Rfl: 2   dapagliflozin  propanediol (FARXIGA ) 10 MG TABS tablet, Take 1 tablet (10 mg total) by mouth daily., Disp: 90 tablet, Rfl: 2   finasteride  (PROSCAR ) 5 MG tablet, Take 5 mg  by mouth daily. , Disp: , Rfl:    folic acid  (FOLVITE ) 1 MG tablet, Take 1 mg by mouth daily., Disp: , Rfl:    furosemide  (LASIX ) 40 MG tablet, Take 1 tablet (40 mg total) by mouth daily., Disp: 30 tablet, Rfl: 11   hydrALAZINE  (APRESOLINE ) 25 MG tablet, Take 1 tablet (25 mg total) by mouth 2 (two) times daily with a meal., Disp: 180 tablet, Rfl: 1   isosorbide  mononitrate (IMDUR ) 120 MG 24 hr tablet, Take 1 tablet (120 mg total) by mouth daily., Disp: 90 tablet, Rfl: 2   lisinopril (ZESTRIL) 40 MG tablet, TAKE ONE TABLET BY MOUTH DAILY, Disp: 90 tablet, Rfl: 1   nitroGLYCERIN  (NITROSTAT ) 0.4 MG SL tablet, Place 1 tablet (0.4 mg total) under the tongue every 5 (five) minutes as needed for chest pain., Disp: 25 tablet, Rfl: 3   Omega-3 Fatty Acids (FISH OIL) 1000 MG CAPS, Take 1,000 mg by mouth daily., Disp: , Rfl:    pantoprazole  (PROTONIX ) 40 MG tablet, Take 1 tablet (40 mg total) by mouth daily., Disp: 90 tablet, Rfl:  1   rosuvastatin  (CRESTOR ) 20 MG tablet, Take 1 tablet (20 mg total) by mouth daily., Disp: 90 tablet, Rfl: 2   thiamine 100 MG tablet, Take 100 mg by mouth daily., Disp: , Rfl:    Review of Systems Review of Systems  Constitutional:  Negative for chills and fever.  Eyes:  Negative for blurred vision and double vision.  Respiratory:  Negative for cough and shortness of breath.   Cardiovascular:  Negative for chest pain, palpitations and leg swelling.  Gastrointestinal:  Negative for abdominal pain, constipation, diarrhea, heartburn, nausea and vomiting.  Genitourinary:  Negative for frequency.  Musculoskeletal:  Negative for myalgias.  Skin:  Negative for itching and rash.  Neurological:  Negative for dizziness, weakness and headaches.  Endo/Heme/Allergies:  Negative for polydipsia.       Objective:    Vitals BP 122/70 (BP Location: Left Arm, Patient Position: Sitting, Cuff Size: Normal)   Pulse 60   Temp 97.9 F (36.6 C)   Resp 18   Ht 5' 9 (1.753 m)   Wt 191 lb  2 oz (86.7 kg)   SpO2 98%   BMI 28.22 kg/m    Physical Examination Physical Exam Constitutional:      Appearance: Normal appearance. He is not ill-appearing.  Cardiovascular:     Rate and Rhythm: Normal rate and regular rhythm.     Pulses: Normal pulses.     Heart sounds: No murmur heard.    No friction rub. No gallop.  Pulmonary:     Effort: Pulmonary effort is normal. No respiratory distress.     Breath sounds: No wheezing, rhonchi or rales.  Abdominal:     General: Abdomen is flat. Bowel sounds are normal. There is no distension.     Palpations: Abdomen is soft.     Tenderness: There is no abdominal tenderness.  Musculoskeletal:     Right lower leg: No edema.     Left lower leg: No edema.  Skin:    General: Skin is warm and dry.     Findings: No rash.  Neurological:     General: No focal deficit present.     Mental Status: He is alert and oriented to person, place, and time.  Psychiatric:        Mood and Affect: Mood normal.        Assessment & Plan:   Essential hypertension His BP is controlled where we will continue his BP meds.  Diabetic nephropathy associated with type 2 diabetes mellitus (HCC) We will check his HgBA1c today with goal A1c <7%.  He will remain on farxiga .  Stage 3a chronic kidney disease (HCC) We will continue to control his BP and diabetes.  He is to avoid NSAIDS.  He remains on lisinopril and farxiga  at this time.    Return in about 3 months (around 05/14/2024) for annual.   Selinda Fleeta Finger, MD

## 2024-02-09 NOTE — Assessment & Plan Note (Signed)
 We will continue to control his BP and diabetes.  He is to avoid NSAIDS.  He remains on lisinopril and farxiga  at this time.

## 2024-02-10 LAB — HEMOGLOBIN A1C
Est. average glucose Bld gHb Est-mCnc: 134 mg/dL
Hgb A1c MFr Bld: 6.3 % — ABNORMAL HIGH (ref 4.8–5.6)

## 2024-02-16 DIAGNOSIS — C44612 Basal cell carcinoma of skin of right upper limb, including shoulder: Secondary | ICD-10-CM | POA: Diagnosis not present

## 2024-02-16 DIAGNOSIS — L570 Actinic keratosis: Secondary | ICD-10-CM | POA: Diagnosis not present

## 2024-02-16 DIAGNOSIS — C44319 Basal cell carcinoma of skin of other parts of face: Secondary | ICD-10-CM | POA: Diagnosis not present

## 2024-02-18 ENCOUNTER — Ambulatory Visit: Payer: Self-pay

## 2024-02-18 NOTE — Progress Notes (Signed)
 Patient called.  Patient aware.  I have called and informed the patient  His diabetes is controlled. Todd Sampson  Pt aware.

## 2024-03-23 ENCOUNTER — Other Ambulatory Visit: Payer: Self-pay | Admitting: Cardiology

## 2024-03-29 DIAGNOSIS — K219 Gastro-esophageal reflux disease without esophagitis: Secondary | ICD-10-CM | POA: Diagnosis not present

## 2024-03-31 ENCOUNTER — Other Ambulatory Visit: Payer: Self-pay | Admitting: Cardiology

## 2024-04-01 ENCOUNTER — Telehealth: Payer: Self-pay

## 2024-04-01 NOTE — Telephone Encounter (Signed)
   Pre-operative Risk Assessment    Patient Name: Todd Sampson  DOB: [birth year] MRN: [id number]   Date of last office visit: 01/06/24 Date of next office visit: N/A   Request for Surgical Clearance    Procedure:  EGD dilation  Date of Surgery:  Clearance 04/27/24                                Surgeon:  Dr. Evalene Misenheimer Surgeon's Group or Practice Name:  [facility]  Phone number:  [phone] Fax number:  [phone]   Type of Clearance Requested:   - Pharmacy:  Hold Apixaban  (Eliquis ) on 04/25/24 and hold Plavix  on 04/22/24   Type of Anesthesia:  Not Indicated   Additional requests/questions:    Todd Sampson   04/01/2024, 7:16 AM

## 2024-04-02 ENCOUNTER — Telehealth: Payer: Self-pay

## 2024-04-02 DIAGNOSIS — I4821 Permanent atrial fibrillation: Secondary | ICD-10-CM

## 2024-04-02 MED ORDER — APIXABAN 2.5 MG PO TABS: 2.5000 mg | ORAL_TABLET | Freq: Two times a day (BID) | ORAL | 2 refills | Status: DC

## 2024-04-02 NOTE — Telephone Encounter (Signed)
 Patient with diagnosis of Afib on Eliquis  for anticoagulation.    Procedure:  EGD dilation   Date of Surgery:  Clearance 04/27/24   CHA2DS2-VASc Score = 6   This indicates a 9.7% annual risk of stroke. The patient's score is based upon: CHF History: 1 HTN History: 1 Diabetes History: 1 Stroke History: 0 Vascular Disease History: 1 Age Score: 2 Gender Score: 0    CrCl 17 ml/min Platelet count 162 K  Patient has not had an Afib/aflutter ablation within the last 3 months or DCCV within the last 30 days  Per office protocol, patient can hold Eliquis  for 2 days prior to procedure.   Patient will not need bridging with Lovenox (enoxaparin) around procedure.  Of note, the patient's dose of Eliquis  should be reduced to 2.5 mg twice daily due to serum creatinine >1.5 and age >=80. Will contact the patient to inform them of the dose adjustment and send the updated prescription.  **This guidance is not considered finalized until pre-operative APP has relayed final recommendations.**

## 2024-04-02 NOTE — Telephone Encounter (Signed)
   Patient Name: Todd Sampson  DOB: Jun 03, 1935 MRN: [id number]  Primary Cardiologist: Jennifer JONELLE Crape, MD  Clinical pharmacists have reviewed the patient's past medical history, labs, and current medications as part of preoperative protocol coverage. The following recommendations have been made:  Patient with diagnosis of Afib on Eliquis  for anticoagulation.     Procedure:  EGD dilation   Date of Surgery:  Clearance 04/27/24    CHA2DS2-VASc Score = 6   This indicates a 9.7% annual risk of stroke. The patient's score is based upon: CHF History: 1 HTN History: 1 Diabetes History: 1 Stroke History: 0 Vascular Disease History: 1 Age Score: 2 Gender Score: 0     CrCl 17 ml/min Platelet count 162 K   Patient has not had an Afib/aflutter ablation within the last 3 months or DCCV within the last 30 days   Per office protocol, patient can hold Eliquis  for 2 days prior to procedure.   Patient will not need bridging with Lovenox (enoxaparin) around procedure.   Of note, the patient's dose of Eliquis  should be reduced to 2.5 mg twice daily due to serum creatinine >1.5 and age >=80. Will contact the patient to inform them of the dose adjustment and send the updated prescription.    I will route this recommendation to the requesting party via Epic fax function and remove from pre-op pool.  Please call with questions.  Lamarr Satterfield, NP 04/02/2024, 2:56 PM

## 2024-04-02 NOTE — Telephone Encounter (Signed)
 Informed the patient that the Eliquis  dose should be reduced to 2.5 mg twice daily. (Age >80 and Scr 1.68 (09/2023) and Scr 1.54 (10/2023). A new prescription will be sent to the patient's preferred pharmacy. Patient verbalized understanding and confirmed pharmacy preference.

## 2024-04-05 ENCOUNTER — Telehealth: Payer: Self-pay | Admitting: Cardiology

## 2024-04-05 DIAGNOSIS — I4821 Permanent atrial fibrillation: Secondary | ICD-10-CM

## 2024-04-05 MED ORDER — APIXABAN 2.5 MG PO TABS: 2.5000 mg | ORAL_TABLET | Freq: Two times a day (BID) | ORAL | 1 refills | Status: AC

## 2024-04-05 NOTE — Telephone Encounter (Signed)
 Prescription refill request for Eliquis  received. Indication: AF Last office visit: 01/06/24  R Revankar MD Scr: 1.54 on 11/04/23  [facility] Age: 89 Weight: 87.7kg  Based on above findings Eliquis  2.5mg  twice daily is the appropriate dose.  Refill approved.

## 2024-04-05 NOTE — Telephone Encounter (Signed)
*  STAT* If patient is at the pharmacy, call can be transferred to refill team.   1. Which medications need to be refilled? (please list name of each medication and dose if known)   apixaban  (ELIQUIS ) 2.5 MG TABS tablet    DIFFERENT PHARMACY   4. Which pharmacy/location (including street and city if local pharmacy) is medication to be sent to?  [facility] - [place], [place] - [address] Phone: [phone]  Fax: [phone]       5. Do they need a 30 day or 90 day supply? 90

## 2024-04-07 NOTE — Telephone Encounter (Signed)
 Will fax the clearance notes with the Plavix  recommendations.

## 2024-04-07 NOTE — Telephone Encounter (Signed)
   Darice from [facility] is calling to follow up. She said they received the fax for the patient clearance but did not address patient plavix  and it doesn't show that patient is cleared for the procedure

## 2024-04-07 NOTE — Telephone Encounter (Signed)
 Left a message for the surgeons office to call our office to find out the following:  If they are needing Medical clearance not just Pharmacy clearance?  Surgeons office requested hold recommendations for the following medications:  Eliquis  and Plavix .   Eliquis : Have recommendations Plavix : Need recommendations   Will route to the Preop pool for the Preop APP to review.

## 2024-04-07 NOTE — Telephone Encounter (Signed)
 Surgeon's office called back and they need medical and pharmacy clearance. Also they received instructions on Eliquis  but not for Plavix . Please advise

## 2024-04-07 NOTE — Telephone Encounter (Signed)
   Patient Name: Todd Sampson  DOB: 05-16-1935 MRN: [id number]  Primary Cardiologist: Jennifer JONELLE Crape, MD  Chart reviewed as part of pre-operative protocol coverage. Given past medical history and time since last visit, based on ACC/AHA guidelines, COTTRELL GENTLES is at acceptable risk for the planned procedure without further cardiovascular testing.   Per office protocol, if patient is without any new symptoms or concerns at the time of their virtual visit, he may hold Plavix  for 5 days prior to procedure. Please resume Plavix  as soon as possible postprocedure, at the discretion of the surgeon.    The patient was advised that if he develops new symptoms prior to surgery to contact our office to arrange for a follow-up visit, and he verbalized understanding.  I will route this recommendation to the requesting party via Epic fax function and remove from pre-op pool.  Please call with questions.  Lamarr Satterfield, NP 04/07/2024, 11:23 AM

## 2024-04-12 ENCOUNTER — Ambulatory Visit: Admitting: Internal Medicine

## 2024-04-16 DIAGNOSIS — H6123 Impacted cerumen, bilateral: Secondary | ICD-10-CM | POA: Diagnosis not present

## 2024-04-27 DIAGNOSIS — E119 Type 2 diabetes mellitus without complications: Secondary | ICD-10-CM | POA: Diagnosis not present

## 2024-04-27 DIAGNOSIS — D131 Benign neoplasm of stomach: Secondary | ICD-10-CM | POA: Diagnosis not present

## 2024-04-27 DIAGNOSIS — K2270 Barrett's esophagus without dysplasia: Secondary | ICD-10-CM | POA: Diagnosis not present

## 2024-04-27 DIAGNOSIS — K317 Polyp of stomach and duodenum: Secondary | ICD-10-CM | POA: Diagnosis not present

## 2024-04-27 DIAGNOSIS — E785 Hyperlipidemia, unspecified: Secondary | ICD-10-CM | POA: Diagnosis not present

## 2024-04-27 DIAGNOSIS — G610 Guillain-Barre syndrome: Secondary | ICD-10-CM | POA: Diagnosis not present

## 2024-04-27 DIAGNOSIS — Z8546 Personal history of malignant neoplasm of prostate: Secondary | ICD-10-CM | POA: Diagnosis not present

## 2024-04-27 DIAGNOSIS — K219 Gastro-esophageal reflux disease without esophagitis: Secondary | ICD-10-CM | POA: Diagnosis not present

## 2024-04-27 DIAGNOSIS — Z951 Presence of aortocoronary bypass graft: Secondary | ICD-10-CM | POA: Diagnosis not present

## 2024-04-27 DIAGNOSIS — K449 Diaphragmatic hernia without obstruction or gangrene: Secondary | ICD-10-CM | POA: Diagnosis not present

## 2024-04-27 DIAGNOSIS — I10 Essential (primary) hypertension: Secondary | ICD-10-CM | POA: Diagnosis not present

## 2024-04-27 DIAGNOSIS — I4891 Unspecified atrial fibrillation: Secondary | ICD-10-CM | POA: Diagnosis not present

## 2024-04-27 DIAGNOSIS — Z7984 Long term (current) use of oral hypoglycemic drugs: Secondary | ICD-10-CM | POA: Diagnosis not present

## 2024-04-27 DIAGNOSIS — Z79899 Other long term (current) drug therapy: Secondary | ICD-10-CM | POA: Diagnosis not present

## 2024-04-27 DIAGNOSIS — Z880 Allergy status to penicillin: Secondary | ICD-10-CM | POA: Diagnosis not present

## 2024-05-04 DIAGNOSIS — N1832 Chronic kidney disease, stage 3b: Secondary | ICD-10-CM | POA: Diagnosis not present

## 2024-05-14 ENCOUNTER — Encounter: Admitting: Internal Medicine

## 2024-05-21 ENCOUNTER — Encounter: Payer: Self-pay | Admitting: Internal Medicine

## 2024-05-21 ENCOUNTER — Ambulatory Visit: Admitting: Internal Medicine

## 2024-05-21 VITALS — BP 138/54 | HR 75 | Temp 98.2°F | Resp 16 | Ht 70.0 in | Wt 196.4 lb

## 2024-05-21 DIAGNOSIS — R809 Proteinuria, unspecified: Secondary | ICD-10-CM

## 2024-05-21 DIAGNOSIS — N1831 Chronic kidney disease, stage 3a: Secondary | ICD-10-CM | POA: Diagnosis not present

## 2024-05-21 DIAGNOSIS — I25810 Atherosclerosis of coronary artery bypass graft(s) without angina pectoris: Secondary | ICD-10-CM | POA: Diagnosis not present

## 2024-05-21 DIAGNOSIS — Z23 Encounter for immunization: Secondary | ICD-10-CM | POA: Diagnosis not present

## 2024-05-21 DIAGNOSIS — C61 Malignant neoplasm of prostate: Secondary | ICD-10-CM | POA: Diagnosis not present

## 2024-05-21 DIAGNOSIS — K22719 Barrett's esophagus with dysplasia, unspecified: Secondary | ICD-10-CM

## 2024-05-21 DIAGNOSIS — I5032 Chronic diastolic (congestive) heart failure: Secondary | ICD-10-CM

## 2024-05-21 DIAGNOSIS — E1121 Type 2 diabetes mellitus with diabetic nephropathy: Secondary | ICD-10-CM | POA: Diagnosis not present

## 2024-05-21 DIAGNOSIS — E1129 Type 2 diabetes mellitus with other diabetic kidney complication: Secondary | ICD-10-CM | POA: Diagnosis not present

## 2024-05-21 DIAGNOSIS — K219 Gastro-esophageal reflux disease without esophagitis: Secondary | ICD-10-CM

## 2024-05-21 DIAGNOSIS — N400 Enlarged prostate without lower urinary tract symptoms: Secondary | ICD-10-CM

## 2024-05-21 DIAGNOSIS — E7800 Pure hypercholesterolemia, unspecified: Secondary | ICD-10-CM | POA: Diagnosis not present

## 2024-05-21 DIAGNOSIS — Z6828 Body mass index (BMI) 28.0-28.9, adult: Secondary | ICD-10-CM | POA: Insufficient documentation

## 2024-05-21 DIAGNOSIS — M1612 Unilateral primary osteoarthritis, left hip: Secondary | ICD-10-CM

## 2024-05-21 DIAGNOSIS — S32020S Wedge compression fracture of second lumbar vertebra, sequela: Secondary | ICD-10-CM

## 2024-05-21 DIAGNOSIS — I872 Venous insufficiency (chronic) (peripheral): Secondary | ICD-10-CM | POA: Diagnosis not present

## 2024-05-21 DIAGNOSIS — J4530 Mild persistent asthma, uncomplicated: Secondary | ICD-10-CM | POA: Insufficient documentation

## 2024-05-21 DIAGNOSIS — M818 Other osteoporosis without current pathological fracture: Secondary | ICD-10-CM

## 2024-05-21 DIAGNOSIS — M8000XS Age-related osteoporosis with current pathological fracture, unspecified site, sequela: Secondary | ICD-10-CM

## 2024-05-21 DIAGNOSIS — I10 Essential (primary) hypertension: Secondary | ICD-10-CM | POA: Diagnosis not present

## 2024-05-21 DIAGNOSIS — G4733 Obstructive sleep apnea (adult) (pediatric): Secondary | ICD-10-CM | POA: Diagnosis not present

## 2024-05-21 DIAGNOSIS — I4821 Permanent atrial fibrillation: Secondary | ICD-10-CM | POA: Diagnosis not present

## 2024-05-21 DIAGNOSIS — Z951 Presence of aortocoronary bypass graft: Secondary | ICD-10-CM

## 2024-05-21 DIAGNOSIS — E782 Mixed hyperlipidemia: Secondary | ICD-10-CM

## 2024-05-21 NOTE — Assessment & Plan Note (Signed)
 He has arthritis of multiple areas.  He can use tylenol  as needed.  Stay away from NSAIDS.

## 2024-05-21 NOTE — Assessment & Plan Note (Signed)
 He remains rate controlled and seems to be in sinus rhythm today.  Cardiology has adjusted his apixaban  due to his CKD.

## 2024-05-21 NOTE — Assessment & Plan Note (Signed)
 The patient seems euvolemic on my exam today.  We will continue on his current dose of lasix .  He will continue with daily weights.  Cardiology's notes were reviewed.

## 2024-05-21 NOTE — Assessment & Plan Note (Signed)
 He has not seen Dr. Marda in the last year.  His BPH is stable.  We will check his PSA at this time.

## 2024-05-21 NOTE — Assessment & Plan Note (Signed)
 His goal HgBA1c <7% which has been controlled.  We will cotninue to monitor.  His diabetic foot exam was normal today.

## 2024-05-21 NOTE — Assessment & Plan Note (Signed)
 He will continue to followup with GI as directed.

## 2024-05-21 NOTE — Assessment & Plan Note (Signed)
 His CAD is stable and he denies any angina today.  We will continue with risk factor modification.  He is on a statin and he is on plavix  with apixaban .

## 2024-05-21 NOTE — Assessment & Plan Note (Signed)
 He has a history of kyphoplasty.  Please see recommends below.

## 2024-05-21 NOTE — Assessment & Plan Note (Signed)
 The patient was on prolia  injections before the COVID 19 pandemic.  I am going to repeat a bone density and see what we need to do including restarting his prolia  injections.

## 2024-05-21 NOTE — Assessment & Plan Note (Signed)
 His goal LDL with his history of DM and CAD is <55.  We will check his FLP today.

## 2024-05-21 NOTE — Assessment & Plan Note (Signed)
 Continue on proscar  for his BPH.  His urination seems stable.

## 2024-05-21 NOTE — Assessment & Plan Note (Signed)
 He remains compliant with his CPAP.

## 2024-05-21 NOTE — Assessment & Plan Note (Signed)
 He will avoid salt.  He wears compression hose daily.

## 2024-05-21 NOTE — Progress Notes (Signed)
 Preventive Screening-Counseling & Management    Todd Sampson is a 89 year old Caucasian/White male who presents for his annual wellness exam. He is due for the following health maintenance studies: visual exam and screening labs. This patient's past medical history Asthma, Atrial Fibrillation, Barrett's esophagus, Benign Essential Hypertension, CAD, Diabetes Mellitus, Type II, Gullain Barre Syndrome, Hyperlipidemia, Osteoarthritis, Osteoporosis, and Prostate Neoplasm, Malignant.    His last yearly dilated eye exam was on 06/02/2023 and this showed no evidence of diabetic retinopathy.  He goes next week for another eye exam.  He denies any problems with his vision today. He has a history of Barrett's esophagus and had a repeat EGD performed in 11/14/2021 which showed barrett's pathology. They are doing repeat EGD every 2 years. They repeated an EGD on 04/27/2024 that showed an irregular GE junction with pathology showing barrett's esophagus and multiple gastric polyps with a hiatal hernia.  He remains on protonix  and denies any reflux or dysphagia at this time.  His last colonoscopy done in 05/2016 and this showed one adenomatous polyp but they do not recommend any further colonoscopies due to his age. His previous colonoscopy in 04/2011 showed one polyp which was removed. He is also followed by Dr. Marda for his prostate cancer where he last saw the urologist in 03/31/2023 and they are following his PSA labs and the patient states everything is stable.   He was diagnosed with prostate cancer in 04/2011 with Stage I cancer. He did not receive any therapy but is on survelliance. However, his last prostate biopsy on 12/2012 was negative.  He is not exercising as much.  He does get yearly flu vaccines. He has had a pneumovax23 vaccine since the age of 69. He has had a shingles vaccine with zostavax. He had a prevnar 13 vaccine in 2016. He has had 4 COVID-19 vaccines including 2 boosters. He did contract COVID-19  in in 03/2020 where he received Regeneron. He denies any long term effects from COVID-19. There is no depression, anxiety or memory loss. He is not on an ASA but he is on Plavix  75mg  daily.   The patient is a 89 year old Caucasian/White male who returns for a follow-up visit for his T2 diabetes.  This past year, we tried to start him on jardiance  and farxiga  for his diabetic macroalbuminuria but he could not afford this medication.  Since the last visit, there have been no problems. He remains has been on farxiga  10mg  daily. He is not walking as much as they would like. He specifically denies unexplained abdominal pain, nausea or vomiting and documented hypoglycemia. He does not routinely check blood sugars. He came in fasting today in anticipation of lab work.  His last HgBA1c was done 6 months ago and was 6.5%. He has no long term complications of diabetes- no diabetic retinopathy, or neuropathy.  We noted he had some macroalbuminuria on his yearly exam in 01/2022 and he may have some diabetic nephropathy with Stage II/IIIa CKD.  He remains on an ACE inhibitor and farxiga .  His last yearly dilated eye exam was on 06/02/2023 and this showed no evidence of diabetic retinopathy.   The patient again has probable Stage II/IIIa CKD.  His CKD is probably due to HTN and diabetes.    I placed him on farxiga  in 05/2023 and he remains on lisinopril.  His creatinine has run 1.1-1.4 with a GFR range of 40-60.  He denies any use of NSAIDS.  I did refer  him to nephrology whom he last saw on 11/04/2023 and he was told his kidney function was stable.  He had a renal US  done on 11/18/2023 that showed an increased cortical echogenicity as can be seen with chronic medical renal disease.  There was a solid echogenic mass midpole right kidney.  There were bilateral renal cysts.  He states they did not mention this mass in his right kidney.   The patient is a 89 year old Caucasian/White male who presents for a follow-up evaluation of  hypertension.  This past year, I also started him on bisoprolol.  However, he stopped this medication due to his HR going down to the 40's and he was having fatigue.  He only took bisoprolol for 1 week.   The patient has been checking his blood pressure at home.  He states his systolic blood pressure at home just a few times and it has run in the 120-130's.  The patient's current medications include: amlodipine  5 mg daily, hydralazine  25mg  BID, Lasix  40mg  daily, Imdur  120 mg daily, and lisinopril 40 mg daily. The patient has been tolerating his medications well. The patient denies any headache, visual changes, dizziness, lightheadness, chest pain, shortness of breath, weakness/numbness, and edema.  He reports there have been no other symptoms noted.  He is still using compression hose for his history of venous insufficiency.  Todd Sampson returns today for routine followup on his cholesterol. Overall, he states he is doing well and is without any complaints or problems at this time. He specifically denies abdominal pain, nausea, vomiting, diarrhea, myalgias, and fatigue. He remains on dietary management which he is not very compliant with but he is also on the following cholesterol lowering medications: Omega 3 fish oil and crestor  20 mg qhs. He is fasting in anticipation for labs today.   Todd Sampson is a 89 yo male who returns today for followup an acute visit for probable acute diastolic CHF when I  saw him on 09/15/2023.  His orthopnea and DOE was almost resolved.  I asked him to stay on lasix  40mg  daily at that time.  He did have a repeat ECHO on 09/29/2023 and this showed a LVEF of 55 to 60%. The left ventricle had normal function. The left ventricle had no regional wall motion abnormalities. Left ventricular diastolic parameters are indeterminate. The right ventricular systolic function was normal. The right ventricular size is normal. There is mildly elevated pulmonary artery systolic pressure. His left  atrial size was severely dilated and his right atrial size was severely dilated.  He did see cardiology in followup on 10/03/2023 where they felt he was euvolemic.  His weight in the office with them was 197lbs and on 09/12/2023 at our office it was 209.  She discussed the importance of weighing himself daily however he did not think that something he can do at that time.  She advised him to keep a close eye on his abdomen swelling or worsening shortness of breath and to notify their office or my office with symptoms.  She wanted him to continue Farxiga  10 mg daily, continue lisinopril 40 mg daily, continue Lasix  40 mg daily.  Again, he presented to the office on 09/12/2023 with complaints of worsening SOB that began 2 weeks prior.  He states that at baseline, he normally does not have SOB or DOE.  He has a PMHx of DM, HTN, A. Fib, CAD with CABG, OSA and asthma.  He states his SOB came on acutely  where he noted that when he helps to move his bedridden wife, he has SOB.  He has DOE at minimal exertion but no SOB at rest.  There has been no chest pain, heart palpitations, peripheral edema, nausea, vomiting, diaphoresis, or syncope.  He has occasional cough and sometimes coughs up gray or brown sputum.  He denies any wheezing but thinks he is having problems with his asthma.  He states he is having orthopnea which started 2 days prior.  I sent him for workup on 09/12/2023 where we obtain a CBC, CMP, Troponin I and proBNP.  His EKG showed A. Fib with HR of 54.  His labs were unremarkable except his HgB was slightly low.  His troponin was normal but his proBNP was elevated at 2310.  I did a CXR on 09/12/2023 and this showed a moderate right pleural effusion with right mid lung and basilar opacities, probably compressive atelectasis due to the effusion.  I felt he had a CHF exacerbation and stopped him off hydrochlorothiazide  and put him on lasix  40mg  daily.  Today, he states his DOE and orthopnea is resolved.  He denies any  chest pain, palpitations, peripheral edema.  He still weighs himself daily he states his weight usually runs 195-200 lbs.  He last saw cardiology on 01/06/2024 and they felt he was stable.     The patient is a 89 yo male who returns for followup of his CAD.  He was hospitalized in 12/06/2021 due to chest pain at that time.  He has a history of severe CAD with history of CABG x3 in 2000 where he presented to the ER with chest pain.  They performed an ECHO on him on 12/05/2021 and this showed a LVEF of 55-60% without wall motion abnormalities but he had mild LVH and they could not assess his diastolic function.  He had mildly reduce RV systolic function and he had bilateral atrial moderate enlargement.  There was borderline dilatation of the ascending aorta with a measurement of 3.6cm. The patient was transferred to [facility] where he underwent a LHC that showed lesions as described:  mid RCA 50%, RV branch 75%, proximal RCA 40%, mid LAD 50%, 2nd diagonal 75%, distal LAD 70%, mid CFx 100%, and SVG to OM with distal graft lesion with 95%.  They did place a DES in his distal SVG to OM.  He had some complications of bradycardia and hypoxia when he did sleep at night where they kept him for an extra day and want him sent for a sleep study as an outpatient.  They patient was discharged home with plans for Plavix  for 6 months and continued Eliquis  for life.  They did change his lovastatin  to crestor . He did complete a course of cardiac rehab.  He did see Dr. Jory in followup on 01/14/2022 and again in 07/2022 and he felt he was stable.  He sees cardiology every 6 months.  He did go for a right total hip in 2022 where he had a nuclear stress stress on 03/28/2021 that was low risk and showed no evidence of ischemia with a LVEF 57%.  In 05/2016, he was having some chest tightness.  They sent him for a cardiolite stress test and the patient tells me that this was normal. He did have a CABG done in 2000 as described. They did  perform a nuclear stress test on him in 10/2012 and this showed no evidence of ischemia with a normal EF 67%.  They repeated  a treadmill sestamibi nuclear stress in 05/2015 and this showed a normal LVEF of 59% and was a negative test.  He underwent a myocardial perfusion test in 09/2017 and this showed an EF 48% with post CABD septal wall hypokinesis.  They felt this study was normal and he had no evidence of ischemia.  He also underwent an ECHO in 09/2017 which showed his EF 55% with RA and LA dilatation and moderate TR.  His CAD is controlled with therapy as summarized in the medication list and previous notes. The patient has the following comorbid condition(s): atrial fibrillation. He has no baseline symptoms of CAD. He has the following modifiable risk factor(s): HTN, DM, and hyperlipidemia.  He denies any chest pain, palpitations, orthopnea, exertional dyspnea, and syncope or recurrent peripheral edema.  This patient also has moderately severe atrial fibrillation of about years known duration and presents today for a status visit.  He did see Dr. Jory on 01/06/2024 where he felt the patient was stable with his A. Fib and CAD.  He decided to decrease his eliquis  down to 2.5mg  BID at that time.  SABRA  His atrial fibrillation is controlled with therapy as summarized in the medication list and previous notes.  He is not on any rate or rhythm control medications.  The patient remains on anticoagulation with eliquis  2.5mg  po BID.  Specifically denied complaints: chest pain, palpitations, generalized weakness, SOB, syncope or TIAs.  He denies any bleeding or abnormal bruising.  There is no BRBPR or melena.  His CHADSVASC score is a 3.   The patient has a history of mild persistent asthma which was diagnosed in 05/2014.  He tells me that he is not taking any pulmonary inhalers.  Again, Pulmonary questioned whether he may have had developing COPD.  The patient has been placed on BREO but was not compliant with this due  to cost and him not having much symptoms of his asthma.   See PMH for summary of pulmonary history and lab reports for status of any previous PFTs: Asthma and CAD. Comorbid conditions : none.. He has the following baseline symptoms / status issues: no baseline SOB or DOE. The patient's condition is controlled by medications as summarized in the medication list on the face sheet. He has the following modifiable risk factors: sedentary lifestyle. He had a CXR in 05/2014 and this showed no evidence of COPD.   The patient also has a history of severe OSA.  He had a sleep study done on 07/15/2022.  This showed severe OSA and they are recommending therapeutic CPAP titration.  He did perform this and saw pulmonary in 08/09/2022 and they have set him up for an autoCPAP where he remains compliant with this.  When he was in the hospital in 11/2021, he was noted to have some complications of bradycardia and hypoxia when he did sleep at night where they kept him for an extra day and want him sent for a sleep study as an outpatient.      The patient also has a history of chronic venous insufficiency.  He has used Lasix  and compression hose in the past which really do not help.   Again, the patient underwent great saphenous vein ablation in 2016 on both legs by vascular surgery.  The patient had developed bilateral venous stasis ulcers where he was being followed by Podiatry and vascular surgery.  Both venous ulcers which were located near his medial malleolus on both sides have completely healed and resolved  after he had this surgery.  He tells me that for years he has had some mild burning on the bottom of both feet but he has some numbness on his right foot.   He has not had any recent problems with leg swelling though.   Todd Sampson had a right total hip arthroplasty under spinal anesthesia on 04/30/2021.  He did followup with ortho on 09/2021 and they felt he was stable and wanted him to continue to exercise.  Again, he was  being followed by ortho and has failed conservative management of OA of his right hip.  He has been having pain for the last 2 years but this has progressively worsened over the last year.  His pain was effecting his walking and his ADL.  Todd Sampson underwent a left total hip arthroplasty on 05/25/2018.  He tolerated this procedure well.   He has a history of osteoporosis and was receiving prolia  injections.  He has a history of L2 vertebral compression fracture in the past. His last surgery was in 09/2019 where he had acute cholecystitis.  He was sent to the hospital where he underwent laparscopic cholecystectomy where he recovered well and had no complications with his surgery.  Todd Sampson also has a history of osteoarthritis which affects his thumbs, left and right shoulder as well as his left hip and his right knee.  We have performed some routine lab testing in the past and his RA factor was elevated and I sent him to rheumatology.  He was diagnosed with osteoarthritis.  He uses voltaren cream and tylenol  as needed which controls this intermittent pain.  He did [employer] in [place] and he has not had any sequlae from this.      The patient is a 89 year old Caucasian/White male who presents for followup of his osteoporosis. The patient had a fall in 11/2015 and was found to have a L2 vertebral compression fracture. He underwent a L2 kyphoplasty where he tolerated this and denies any back pain at this time. We sent him for bone density and he was found to have osteoporosis. He has a history of spinal fractures. He denies a family history of osteoporosis. Current risk factors: diabetes mellitus. He denies the following: smoking, high caffeine intake, alcohol  consumption of more that 7 ounces per week, daily prednisone use, hyperthyroidism, bowel resection, and gastric resection. He states he does not exercise routinely. A bone mineral density study was performed on 12/2015 and his tscore was -2.1. I did  set him up for prolia  injection for his osteoporosis in the setting of a vertebral compression fracture. He had his first prolia  injection in 12/2015 and this was repeated in 07/2016 and again in 01/2017 with his last injection in 01/2019.  He did not receive prolia  injections during the COVID pandemic.        Todd Sampson also has a history of iron defiency anemia and barrett's esophagus where he sees Dr. Towana regularly over the last 20 or so years.  He saw Dr. Towana in 2014 where we sent him back for evaluation for occult GIB.  They did not recommend anything new and ago he felt that the GI bleed is from his duodenum where a capsule endoscopy was done in 2012 and this showed minute bleeding points in the duodenum.  He had a colonoscopy in 05/2016 where they found one polyp which was removed.  They repeated an EGD on 04/27/2024 that showed an irregular GE junction with  pathology showing barrett's esophagus and multiple gastric polyps with a hiatal hernia.  He has had esophageal dilitation done in 07/2011 but denies any dysphagia now.            Are there smokers in your home (other than you)? No  Risk Factors Current exercise habits: as above  Dietary issues discussed: none   Depression Screen (Note: if answer to either of the following is Yes, a more complete depression screening is indicated)   Over the past two weeks, have you felt down, depressed or hopeless? No  Over the past two weeks, have you felt little interest or pleasure in doing things? No  Have you lost interest or pleasure in daily life? No  Do you often feel hopeless? No  Do you cry easily over simple problems? No  Activities of Daily Living In your present state of health, do you have any difficulty performing the following activities?:  Driving? No Managing money?  No Feeding yourself? No Getting from bed to chair? No Climbing a flight of stairs? No Preparing food and eating?: No Bathing or showering? No Getting  dressed: No Getting to the toilet? No Using the toilet:No Moving around from place to place: No In the past year have you fallen or had a near fall?:No   Hearing Difficulties: Yes- wears hearing aids Do you often ask people to speak up or repeat themselves? Yes Do you experience ringing or noises in your ears? No Do you have difficulty understanding soft or whispered voices? Yes   Do you feel that you have a problem with memory? No  Do you often misplace items? No  Do you feel safe at home?  Yes  Cognitive Testing  Alert? Yes  Normal Appearance?Yes  Oriented to person? Yes  Place? Yes   Time? Yes  Recall of three objects?  Yes  Can perform simple calculations? Yes  Displays appropriate judgment?Yes  Can read the correct time from a watch face?Yes  Fall Risk Prevention  Any stairs in or around the home? Yes  If so, are there any without handrails? Yes  Home free of loose throw rugs in walkways, pet beds, electrical cords, etc? Yes  Adequate lighting in your home to reduce risk of falls? Yes  Use of a cane, walker or w/c? No    Time Up and Go  Was the test performed? Yes .  Length of time to ambulate 10 feet: 12 sec.   Gait steady and fast without use of assistive device    Advanced Directives have been discussed with the patient? Yes   List the Names of Other Physician/Practitioners you currently use: Patient Care Team: Fleeta Valeria Mayo, MD as PCP - General (Internal Medicine) Revankar, Jennifer SAUNDERS, MD as PCP - Cardiology (Cardiology) Georgina Norleen SAUNDERS, MD as Referring Physician (Orthopedic Surgery)    Past Medical History:  Diagnosis Date   Age-related osteoporosis with current pathological fracture 05/14/2023   Anemia    Barrett's esophagus    Bilateral carpal tunnel syndrome 10/16/2022   BMI 29.0-29.9,adult 05/14/2023   BMI 32.0-32.9,adult 11/09/2019   BPH (benign prostatic hyperplasia) 10/17/2021   Calculus of gallbladder with chronic cholecystitis without  obstruction 11/09/2019   Chronic diastolic CHF (congestive heart failure) (HCC) 09/15/2023   Chronic venous insufficiency 05/14/2023   Compression fracture of L2 lumbar vertebra (HCC) 05/14/2023   Coronary artery disease involving coronary bypass graft of native heart without angina pectoris 05/14/2023   Coronary artery disease involving native coronary artery  of native heart with unstable angina pectoris (HCC) 12/04/2021   Coronary artery disease of native artery of native heart with stable angina pectoris 06/06/2016   Diet-controlled diabetes mellitus (HCC) 06/06/2016   DOE (dyspnea on exertion) 09/12/2023   Dyslipidemia (high LDL; low HDL) 06/06/2016   Essential hypertension 06/06/2016   GERD (gastroesophageal reflux disease) 10/17/2021   Guillain-Barre disease    H/O: hemorrhoidectomy    Hx of CABG 10/02/2017   Hypercholesteremia 10/17/2021   Hypercholesterolemia 05/14/2023   Hyperlipidemia    Microalbuminuria due to type 2 diabetes mellitus (HCC) 05/08/2022   Mitral regurgitation 07/26/2019   Non-intractable vomiting with nausea 11/09/2019   Obesity (BMI 30.0-34.9) 12/13/2021   OSA (obstructive sleep apnea) 07/15/2022   Permanent atrial fibrillation (HCC) 06/06/2016   Postoperative examination 12/13/2019   Preoperative cardiovascular examination 10/02/2017   Primary osteoarthritis of left hip 04/20/2018   Added automatically from request for surgery [phone]  Formatting of this note might be different from the original. Added automatically from request for surgery [id number]   Prostate cancer (HCC)    Snoring 03/26/2022   Special screening examination for viral disease 11/09/2019   Thyroid  nodule 11/06/2022   Toe pain, bilateral 09/10/2019   Type 2 diabetes, diet controlled (HCC) 10/17/2021   Umbilical hernia without obstruction and without gangrene 11/09/2019   Unilateral primary osteoarthritis, right hip 04/30/2021    Past Surgical History:  Procedure Laterality Date    APPENDECTOMY     CARDIAC BYPASS     CORONARY STENT INTERVENTION N/A 12/04/2021   Procedure: CORONARY STENT INTERVENTION;  Surgeon: Dann Candyce RAMAN, MD;  Location: [facility] INVASIVE CV LAB;  Service: Cardiovascular;  Laterality: N/A;   LEFT HEART CATH AND CORS/GRAFTS ANGIOGRAPHY N/A 12/04/2021   Procedure: LEFT HEART CATH AND CORS/GRAFTS ANGIOGRAPHY;  Surgeon: Dann Candyce RAMAN, MD;  Location[facility] INV[facility] CV LAB;  Service: Cardiovascular;  Laterality: N/A;   REPLACEMENT TOTAL KNEE BILATERAL        Current Medications  Current Outpatient Medications  Medication Sig Dispense Refill   acetaminophen  (TYLENOL ) 500 MG tablet Take 500-1,000 mg by mouth every 6 (six) hours as needed for pain.     amLODipine  (NORVASC ) 5 MG tablet Take 1 tablet (5 mg total) by mouth daily. 90 tablet 2   apixaban  (ELIQUIS ) 2.5 MG TABS tablet Take 1 tablet (2.5 mg total) by mouth 2 (two) times daily. 180 tablet 1   clopidogrel  (PLAVIX ) 75 MG tablet Take 1 tablet (75 mg total) by mouth daily with breakfast. 90 tablet 2   dapagliflozin  propanediol (FARXIGA ) 10 MG TABS tablet Take 1 tablet (10 mg total) by mouth daily. 90 tablet 2   finasteride  (PROSCAR ) 5 MG tablet Take 5 mg by mouth daily.      folic acid  (FOLVITE ) 1 MG tablet Take 1 mg by mouth daily.     furosemide  (LASIX ) 40 MG tablet Take 1 tablet (40 mg total) by mouth daily. 30 tablet 11   hydrALAZINE  (APRESOLINE ) 25 MG tablet Take 1 tablet (25 mg total) by mouth 2 (two) times daily with a meal. 180 tablet 1   isosorbide  mononitrate (IMDUR ) 120 MG 24 hr tablet TAKE ONE TABLET BY MOUTH ONCE DAILY 90 tablet 2   lisinopril (ZESTRIL) 40 MG tablet TAKE ONE TABLET BY MOUTH DAILY 90 tablet 1   nitroGLYCERIN  (NITROSTAT ) 0.4 MG SL tablet Place 1 tablet (0.4 mg total) under the tongue every 5 (five) minutes as needed for chest pain. 25 tablet 3   Omega-3 Fatty Acids (FISH  OIL) 1000 MG CAPS Take 1,000 mg by mouth daily.     pantoprazole  (PROTONIX ) 40 MG tablet Take 1 tablet (40  mg total) by mouth daily. 90 tablet 1   rosuvastatin  (CRESTOR ) 20 MG tablet Take 1 tablet (20 mg total) by mouth daily. 90 tablet 2   thiamine 100 MG tablet Take 100 mg by mouth daily.     No current facility-administered medications for this visit.    Allergies Penicillins   Social History Social History   Tobacco Use   Smoking status: Former   Smokeless tobacco: Never  Substance Use Topics   Alcohol  use: No     Review of Systems Review of Systems  Constitutional:  Negative for chills, fever, malaise/fatigue and weight loss.  Eyes:  Negative for blurred vision and double vision.  Respiratory:  Negative for cough, hemoptysis, shortness of breath and wheezing.   Cardiovascular:  Negative for chest pain, palpitations and leg swelling.  Gastrointestinal:  Negative for abdominal pain, blood in stool, constipation, diarrhea, heartburn, melena, nausea and vomiting.  Genitourinary:  Negative for frequency and hematuria.  Musculoskeletal:  Negative for myalgias.  Skin:  Negative for itching and rash.  Neurological:  Negative for dizziness, weakness and headaches.  Endo/Heme/Allergies:  Negative for polydipsia.     Physical Exam:      Body mass index is 28.18 kg/m. BP (!) 138/54   Pulse 75   Temp 98.2 F (36.8 C) (Temporal)   Resp 16   Ht 5' 10 (1.778 m)   Wt 196 lb 6.4 oz (89.1 kg)   SpO2 98%   BMI 28.18 kg/m   Physical Exam Constitutional:      Appearance: Normal appearance. He is not ill-appearing.  HENT:     Head: Normocephalic and atraumatic.     Right Ear: Tympanic membrane, ear canal and external ear normal.     Left Ear: Tympanic membrane, ear canal and external ear normal.     Nose: Nose normal. No congestion or rhinorrhea.     Mouth/Throat:     Mouth: Mucous membranes are dry.     Pharynx: Oropharynx is clear. No oropharyngeal exudate or posterior oropharyngeal erythema.  Eyes:     General: No scleral icterus.    Conjunctiva/sclera: Conjunctivae  normal.     Pupils: Pupils are equal, round, and reactive to light.  Neck:     Vascular: No carotid bruit.  Cardiovascular:     Rate and Rhythm: Normal rate and regular rhythm.     Pulses: Normal pulses.     Heart sounds: No murmur heard.    No friction rub. No gallop.  Pulmonary:     Effort: Pulmonary effort is normal. No respiratory distress.     Breath sounds: No wheezing, rhonchi or rales.  Abdominal:     General: Abdomen is flat. Bowel sounds are normal. There is no distension.     Palpations: Abdomen is soft.     Tenderness: There is no abdominal tenderness.  Musculoskeletal:     Cervical back: Neck supple. No tenderness.     Right lower leg: No edema.     Left lower leg: No edema.  Lymphadenopathy:     Cervical: No cervical adenopathy.  Skin:    General: Skin is warm and dry.     Findings: No rash.  Neurological:     General: No focal deficit present.     Mental Status: He is alert and oriented to person, place, and time.  Psychiatric:  Mood and Affect: Mood normal.        Behavior: Behavior normal.      Assessment:      Diabetic nephropathy associated with type 2 diabetes mellitus (HCC)  Stage 3a chronic kidney disease (HCC)  Essential hypertension  Chronic diastolic CHF (congestive heart failure) (HCC)  Permanent atrial fibrillation (HCC)  Microalbuminuria due to type 2 diabetes mellitus (HCC)  Coronary artery disease involving coronary bypass graft of native heart without angina pectoris  Hypercholesterolemia  Age-related osteoporosis with current pathological fracture, sequela  Prostate cancer (HCC)  OSA (obstructive sleep apnea)  Chronic venous insufficiency  Barrett's esophagus with dysplasia  Gastroesophageal reflux disease, unspecified whether esophagitis present  Benign prostatic hyperplasia without lower urinary tract symptoms  Hx of CABG  Compression fracture of L2 vertebra, sequela  Mild persistent asthma without  complication  BMI 28.0-28.9,adult  Primary osteoarthritis of left hip  Mixed hyperlipidemia    Plan:     During the course of the visit the patient was educated and counseled about appropriate screening and preventive services including:   Pneumococcal vaccine  Influenza vaccine Bone densitometry screening Colorectal cancer screening Advanced directives: as discussed  Diet review for nutrition referral? Yes ____  Not Indicated __X__   Patient Instructions (the written plan) was given to the patient.  Permanent atrial fibrillation (HCC) He remains rate controlled and seems to be in sinus rhythm today.  Cardiology has adjusted his apixaban  due to his CKD.    Essential hypertension His BP is doing well.  We will continue to monitor.  Coronary artery disease involving coronary bypass graft of native heart without angina pectoris His CAD is stable and he denies any angina today.  We will continue with risk factor modification.  He is on a statin and he is on plavix  with apixaban .  Chronic venous insufficiency He will avoid salt.  He wears compression hose daily.  Chronic diastolic CHF (congestive heart failure) (HCC) The patient seems euvolemic on my exam today.  We will continue on his current dose of lasix .  He will continue with daily weights.  Cardiology's notes were reviewed.  OSA (obstructive sleep apnea) He remains compliant with his CPAP.  Mild persistent asthma without complication This is not an issue today.  GERD (gastroesophageal reflux disease) He has a history of barrrett's and GERD and remains on protonix  40mg  daily.  He denies any dysphagia at this time.  Barrett's esophagus He will continue to followup with GI as directed.  Microalbuminuria due to type 2 diabetes mellitus (HCC) He has spilled protein out of his urine.  He is on an ACE-I and farxiga .  We will check his urine MACR today.  Diabetic nephropathy associated with type 2 diabetes mellitus  (HCC) His goal HgBA1c <7% which has been controlled.  We will cotninue to monitor.  His diabetic foot exam was normal today.  Primary osteoarthritis of left hip He has arthritis of multiple areas.  He can use tylenol  as needed.  Stay away from NSAIDS.  Compression fracture of L2 lumbar vertebra (HCC) He has a history of kyphoplasty.  Please see recommends below.  Age-related osteoporosis with current pathological fracture The patient was on prolia  injections before the COVID 19 pandemic.  I am going to repeat a bone density and see what we need to do including restarting his prolia  injections.  Stage 3a chronic kidney disease (HCC) He has Stage IIIa CKD due to his DM and HTN.  I reviewed his notes from nephrology.  He will continue with his current meds.  I contacted Dr. Marlee and they will reassess this right sided kidney mass next week when they see him in the office.  Prostate cancer [facility]) He has not seen Dr. Marda in the last year.  His BPH is stable.  We will check his PSA at this time.  BPH (benign prostatic hyperplasia) Continue on proscar  for his BPH.  His urination seems stable.  Hyperlipidemia His goal LDL with his history of DM and CAD is <55.  We will check his FLP today.  Hx of CABG Plan as above.  BMI 28.0-28.9,adult I want him to eat healthy and continue to be active.   Prevention Health maintenance discussed.  We will send him for bone density.  We will obtain some yearly labs.    Medicare Attestation I have personally reviewed: The patient's medical and social history Their use of alcohol , tobacco or illicit drugs Their current medications and supplements The patient's functional ability including ADLs,fall risks, home safety risks, cognitive, and hearing and visual impairment Diet and physical activities Evidence for depression or mood disorders  The patient's weight, height, and BMI have been recorded in the chart.  I have made referrals, counseling,  and provided education to the patient based on review of the above and I have provided the patient with a written personalized care plan for preventive services.     Selinda Fleeta Finger, MD   05/21/2024

## 2024-05-21 NOTE — Assessment & Plan Note (Signed)
 Plan as above.

## 2024-05-21 NOTE — Assessment & Plan Note (Signed)
 He has a history of barrrett's and GERD and remains on protonix  40mg  daily.  He denies any dysphagia at this time.

## 2024-05-21 NOTE — Assessment & Plan Note (Signed)
 He has spilled protein out of his urine.  He is on an ACE-I and farxiga .  We will check his urine MACR today.

## 2024-05-21 NOTE — Assessment & Plan Note (Signed)
This is not an issue today.

## 2024-05-21 NOTE — Assessment & Plan Note (Signed)
 He has Stage IIIa CKD due to his DM and HTN.  I reviewed his notes from nephrology.  He will continue with his current meds.  I contacted Dr. Marlee and they will reassess this right sided kidney mass next week when they see him in the office.

## 2024-05-21 NOTE — Assessment & Plan Note (Signed)
His BP is doing well.  We will continue to monitor. 

## 2024-05-21 NOTE — Assessment & Plan Note (Signed)
 I want him to eat healthy and continue to be active.

## 2024-05-25 ENCOUNTER — Ambulatory Visit

## 2024-05-25 DIAGNOSIS — N1832 Chronic kidney disease, stage 3b: Secondary | ICD-10-CM | POA: Diagnosis not present

## 2024-05-25 DIAGNOSIS — E1122 Type 2 diabetes mellitus with diabetic chronic kidney disease: Secondary | ICD-10-CM | POA: Diagnosis not present

## 2024-05-25 DIAGNOSIS — I129 Hypertensive chronic kidney disease with stage 1 through stage 4 chronic kidney disease, or unspecified chronic kidney disease: Secondary | ICD-10-CM | POA: Diagnosis not present

## 2024-05-25 DIAGNOSIS — N2889 Other specified disorders of kidney and ureter: Secondary | ICD-10-CM | POA: Diagnosis not present

## 2024-06-01 DIAGNOSIS — Z974 Presence of external hearing-aid: Secondary | ICD-10-CM | POA: Diagnosis not present

## 2024-06-01 DIAGNOSIS — T161XXA Foreign body in right ear, initial encounter: Secondary | ICD-10-CM | POA: Diagnosis not present

## 2024-06-01 DIAGNOSIS — H9193 Unspecified hearing loss, bilateral: Secondary | ICD-10-CM | POA: Diagnosis not present

## 2024-06-02 DIAGNOSIS — E119 Type 2 diabetes mellitus without complications: Secondary | ICD-10-CM | POA: Diagnosis not present

## 2024-06-02 DIAGNOSIS — H01009 Unspecified blepharitis unspecified eye, unspecified eyelid: Secondary | ICD-10-CM | POA: Diagnosis not present

## 2024-06-03 ENCOUNTER — Other Ambulatory Visit: Payer: Self-pay

## 2024-06-03 MED ORDER — DAPAGLIFLOZIN PROPANEDIOL 10 MG PO TABS: 10.0000 mg | ORAL_TABLET | Freq: Every day | ORAL | 2 refills | Status: AC

## 2024-06-03 NOTE — Progress Notes (Signed)
 Refilled Farxiga  to [facility]

## 2024-06-10 ENCOUNTER — Telehealth: Payer: Self-pay | Admitting: *Deleted

## 2024-06-10 MED ORDER — CLOPIDOGREL BISULFATE 75 MG PO TABS: 75.0000 mg | ORAL_TABLET | Freq: Every day | ORAL | 1 refills | Status: AC

## 2024-06-10 NOTE — Telephone Encounter (Signed)
 Rx refill sent to pharmacy.

## 2024-07-13 ENCOUNTER — Encounter: Payer: Self-pay | Admitting: Adult Health

## 2024-07-13 ENCOUNTER — Ambulatory Visit: Admitting: Adult Health

## 2024-07-13 VITALS — BP 140/50 | HR 72 | Ht 69.0 in | Wt 203.8 lb

## 2024-07-13 DIAGNOSIS — Z87891 Personal history of nicotine dependence: Secondary | ICD-10-CM

## 2024-07-13 DIAGNOSIS — Z6830 Body mass index (BMI) 30.0-30.9, adult: Secondary | ICD-10-CM

## 2024-07-13 DIAGNOSIS — I10 Essential (primary) hypertension: Secondary | ICD-10-CM

## 2024-07-13 DIAGNOSIS — G4733 Obstructive sleep apnea (adult) (pediatric): Secondary | ICD-10-CM | POA: Diagnosis not present

## 2024-07-13 NOTE — Patient Instructions (Addendum)
 Continue on CPAP At bedtime , wear all night long.  Change CPAP pressure 11-15cmH2o.  CPAP download in 4 weeks  Work on healthy weight loss  Do not drive if sleepy  Follow up in 1 year with Dr. Neda or Orlie NP

## 2024-07-13 NOTE — Progress Notes (Signed)
 "  @Patient  ID: Todd Sampson, male    DOB: 1934-09-26, 89 y.o.   MRN: [id number]  Chief Complaint  Patient presents with   Sleep Apnea    No problems    Referring provider: Fleeta Valeria Mayo, MD  HPI: 89 year old male seen for sleep consult September 2023 for snoring and nocturnal hypoxemia found to have severe obstructive sleep apnea Medical history significant for A-fib    TEST/EVENTS : Reviewed 07/13/2024  2D echo December 05, 2021 showed EF at 55 to 60%, normal pulmonary artery systolic pressure.    July 15, 2022 that showed severe sleep apnea with a AHI at 31.4/hour and SpO2 low at 74%. Titration portion not done .   07/13/2024 Follow up: OSA Patient presents for a 1 year follow-up.  Patient has underlying severe obstructive sleep apnea.  Patient says he is doing well on CPAP.  Wears a CPAP every single night.  Feels that he benefits from CPAP.  Is using a modified fullface mask.  CPAP download shows 100% compliance.  Daily average usage at 8 hours.  He is on auto CPAP 5 to 15 cm H2O.  AHI 20.5/hour.  Daily average pressure at 11.9 cm H2O.  Patient denies any new medications.  No change in medical history. 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   Allergies[1]  Immunization History  Administered Date(s) Administered   Influenza Inj Mdck Quad Pf 05/08/2022   Influenza Inj Mdck Quad With Preservative 04/06/2021   Influenza, Mdck, Trivalent,PF 6+ MOS(egg free) 05/14/2023   Zoster Recombinant(Shingrix) 05/20/2019    Past Medical History:  Diagnosis Date   Age-related osteoporosis with current pathological fracture 05/14/2023   Anemia    Barrett's esophagus    Bilateral carpal tunnel syndrome 10/16/2022   BMI 29.0-29.9,adult 05/14/2023   BMI 32.0-32.9,adult 11/09/2019   BPH (benign prostatic hyperplasia) 10/17/2021   Calculus of gallbladder with chronic cholecystitis without obstruction 11/09/2019   Chronic diastolic CHF (congestive heart failure) (HCC)  09/15/2023   Chronic venous insufficiency 05/14/2023   Compression fracture of L2 lumbar vertebra (HCC) 05/14/2023   Coronary artery disease involving coronary bypass graft of native heart without angina pectoris 05/14/2023   Coronary artery disease involving native coronary artery of native heart with unstable angina pectoris (HCC) 12/04/2021   Coronary artery disease of native artery of native heart with stable angina pectoris 06/06/2016   Diet-controlled diabetes mellitus (HCC) 06/06/2016   DOE (dyspnea on exertion) 09/12/2023   Dyslipidemia (high LDL; low HDL) 06/06/2016   Essential hypertension 06/06/2016   GERD (gastroesophageal reflux disease) 10/17/2021   Guillain-Barre disease    H/O: hemorrhoidectomy    Hx of CABG 10/02/2017   Hypercholesteremia 10/17/2021   Hypercholesterolemia 05/14/2023   Hyperlipidemia    Microalbuminuria due to type 2 diabetes mellitus (HCC) 05/08/2022   Mitral regurgitation 07/26/2019   Non-intractable vomiting with nausea 11/09/2019   Obesity (BMI 30.0-34.9) 12/13/2021   OSA (obstructive sleep apnea) 07/15/2022   Permanent atrial fibrillation (HCC) 06/06/2016   Postoperative examination 12/13/2019   Preoperative cardiovascular examination 10/02/2017   Primary osteoarthritis of left hip 04/20/2018   Added automatically from request for surgery [phone]  Formatting of this note might be different from the original. Added automatically from request for surgery [phone]   Prostate cancer [facility])    Snoring 03/26/2022   Special screening examination for viral disease 11/09/2019   Thyroid  nodule 11/06/2022   Toe pain, bilateral 09/10/2019   Type 2 diabetes, diet controlled (HCC)  10/17/2021   Umbilical hernia without obstruction and without gangrene 11/09/2019   Unilateral primary osteoarthritis, right hip 04/30/2021    Tobacco History: Tobacco Use History[2] Counseling given: Not Answered Tobacco comments: Quit smoking about 40 years ago.  Never did  smoke bad.  [facility] RN   Outpatient Medications Prior to Visit  Medication Sig Dispense Refill   acetaminophen  (TYLENOL ) 500 MG tablet Take 500-1,000 mg by mouth every 6 (six) hours as needed for pain.     amLODipine  (NORVASC ) 5 MG tablet Take 1 tablet (5 mg total) by mouth daily. 90 tablet 2   apixaban  (ELIQUIS ) 2.5 MG TABS tablet Take 1 tablet (2.5 mg total) by mouth 2 (two) times daily. 180 tablet 1   clopidogrel  (PLAVIX ) 75 MG tablet Take 1 tablet (75 mg total) by mouth daily with breakfast. 90 tablet 1   dapagliflozin  propanediol (FARXIGA ) 10 MG TABS tablet Take 1 tablet (10 mg total) by mouth daily. 90 tablet 2   finasteride  (PROSCAR ) 5 MG tablet Take 5 mg by mouth daily.      folic acid  (FOLVITE ) 1 MG tablet Take 1 mg by mouth daily.     furosemide  (LASIX ) 40 MG tablet Take 1 tablet (40 mg total) by mouth daily. 30 tablet 11   hydrALAZINE  (APRESOLINE ) 25 MG tablet Take 1 tablet (25 mg total) by mouth 2 (two) times daily with a meal. 180 tablet 1   isosorbide  mononitrate (IMDUR ) 120 MG 24 hr tablet TAKE ONE TABLET BY MOUTH ONCE DAILY 90 tablet 2   lisinopril (ZESTRIL) 40 MG tablet TAKE ONE TABLET BY MOUTH DAILY 90 tablet 1   nitroGLYCERIN  (NITROSTAT ) 0.4 MG SL tablet Place 1 tablet (0.4 mg total) under the tongue every 5 (five) minutes as needed for chest pain. 25 tablet 3   Omega-3 Fatty Acids (FISH OIL) 1000 MG CAPS Take 1,000 mg by mouth daily.     pantoprazole  (PROTONIX ) 40 MG tablet Take 1 tablet (40 mg total) by mouth daily. 90 tablet 1   rosuvastatin  (CRESTOR ) 20 MG tablet Take 1 tablet (20 mg total) by mouth daily. 90 tablet 2   thiamine 100 MG tablet Take 100 mg by mouth daily.     No facility-administered medications prior to visit.     Review of Systems:   Constitutional:   No  weight loss, night sweats,  Fevers, chills, fatigue, or  lassitude.  HEENT:   No headaches,  Difficulty swallowing,  Tooth/dental problems, or  Sore throat,                No sneezing, itching, ear  ache, nasal congestion, post nasal drip,   CV:  No chest pain,  Orthopnea, PND, swelling in lower extremities, anasarca, dizziness, palpitations, syncope.   GI  No heartburn, indigestion, abdominal pain, nausea, vomiting, diarrhea, change in bowel habits, loss of appetite, bloody stools.   Resp: No shortness of breath with exertion or at rest.  No excess mucus, no productive cough,  No non-productive cough,  No coughing up of blood.  No change in color of mucus.  No wheezing.  No chest wall deformity  Skin: no rash or lesions.  GU: no dysuria, change in color of urine, no urgency or frequency.  No flank pain, no hematuria   MS:  No joint pain or swelling.  No decreased range of motion.  No back pain.    Physical Exam  BP (!) 140/70 (BP Location: Left Arm, Patient Position: Sitting)   Pulse 72  Ht 5' 9 (1.753 m)   Wt 203 lb 12.8 oz (92.4 kg)   SpO2 98% Comment: RA  BMI 30.10 kg/m   GEN: A/Ox3; pleasant , NAD, well nourished    HEENT:  [place]/AT,   NOSE-clear, THROAT-clear, no lesions, no postnasal drip or exudate noted.   NECK:  Supple w/ fair ROM; no JVD; normal carotid impulses w/o bruits; no thyromegaly or nodules palpated; no lymphadenopathy.    RESP  Clear  P & A; w/o, wheezes/ rales/ or rhonchi. no accessory muscle use, no dullness to percussion  CARD:  RRR, no m/r/g, no peripheral edema, pulses intact, no cyanosis or clubbing.  GI:   Soft & nt; nml bowel sounds; no organomegaly or masses detected.   Musco: Warm bil, no deformities or joint swelling noted.   Neuro: alert, no focal deficits noted.    Skin: Warm, no lesions or rashes    Lab Results:Reviewed 07/13/2024   CBC   BMET  No results found for: PROBNP  Imaging: No results found.  Administration History     None           No data to display          No results found for: NITRICOXIDE     03/26/2022    2:00 PM  Results of the [facility] flowsheet  Sitting and reading 0  Watching TV  2  Sitting, inactive in a public place (e.g. a theatre or a meeting) 0  As a passenger in a car for an hour without a break 0  Lying down to rest in the afternoon when circumstances permit 1  Sitting and talking to someone 0  Sitting quietly after a lunch without alcohol  2  In a car, while stopped for a few minutes in traffic 0  Total score 5        Assessment & Plan:   Assessment and Plan Severe obstructive sleep apnea with excellent compliance on nocturnal CPAP.  Patient is having increased residual episodes.  Will change CPAP pressure to auto CPAP 10 to 15 cm H2O.  In hopes to decrease residual episodes.  Get CPAP download in 4 weeks.  Above changes have been sent to DME order and also changed in [facility]. Morbid obesity with BMI at 30.  Continue with healthy weight management. Hypertension blood pressure is elevated today.  Patient is advised to follow-up with primary care for ongoing management  Plan  Patient Instructions  Continue on CPAP At bedtime , wear all night long.  Change CPAP pressure 11-15cmH2o.  CPAP download in 4 weeks  Work on healthy weight loss  Do not drive if sleepy  Follow up in 1 year with Dr. Neda or Tijuana Scheidegger NP         Madelin Stank, NP 07/13/2024      [1]  Allergies Allergen Reactions   Penicillins Other (See Comments)    Childhood reaction--pt does not remember  [2]  Social History Tobacco Use  Smoking Status Former  Smokeless Tobacco Never  Tobacco Comments   Quit smoking about 40 years ago.  Never did smoke bad.  [facility] RN   "

## 2024-08-23 ENCOUNTER — Ambulatory Visit: Admitting: Internal Medicine
